# Patient Record
Sex: Male | Born: 1955 | Race: Black or African American | Hispanic: No | Marital: Single | Smoking: Current every day smoker
Health system: Southern US, Community
[De-identification: ages and names within clinical notes are randomized; demographics above are authoritative.]

## PROBLEM LIST (undated history)

## (undated) DIAGNOSIS — I10 Essential (primary) hypertension: Secondary | ICD-10-CM

## (undated) DIAGNOSIS — I639 Cerebral infarction, unspecified: Secondary | ICD-10-CM

## (undated) DIAGNOSIS — E785 Hyperlipidemia, unspecified: Secondary | ICD-10-CM

## (undated) DIAGNOSIS — E119 Type 2 diabetes mellitus without complications: Secondary | ICD-10-CM

## (undated) HISTORY — DX: Cerebral infarction, unspecified: I63.9

## (undated) HISTORY — DX: Type 2 diabetes mellitus without complications: E11.9

## (undated) HISTORY — PX: CYST REMOVAL LEG: SHX6280

## (undated) HISTORY — DX: Hyperlipidemia, unspecified: E78.5

## (undated) HISTORY — DX: Essential (primary) hypertension: I10

---

## 1999-09-19 ENCOUNTER — Emergency Department (HOSPITAL_COMMUNITY): Admission: EM | Admit: 1999-09-19 | Discharge: 1999-09-19 | Payer: Self-pay | Admitting: Emergency Medicine

## 1999-09-29 ENCOUNTER — Emergency Department (HOSPITAL_COMMUNITY): Admission: EM | Admit: 1999-09-29 | Discharge: 1999-09-29 | Payer: Self-pay | Admitting: Emergency Medicine

## 1999-09-30 ENCOUNTER — Encounter: Payer: Self-pay | Admitting: *Deleted

## 1999-10-11 ENCOUNTER — Emergency Department (HOSPITAL_COMMUNITY): Admission: EM | Admit: 1999-10-11 | Discharge: 1999-10-11 | Payer: Self-pay | Admitting: Emergency Medicine

## 1999-10-11 ENCOUNTER — Encounter: Payer: Self-pay | Admitting: Emergency Medicine

## 1999-10-21 ENCOUNTER — Encounter: Admission: RE | Admit: 1999-10-21 | Discharge: 1999-10-21 | Payer: Self-pay | Admitting: Family Medicine

## 2015-01-26 DIAGNOSIS — E119 Type 2 diabetes mellitus without complications: Secondary | ICD-10-CM

## 2015-01-26 HISTORY — DX: Type 2 diabetes mellitus without complications: E11.9

## 2015-12-26 DIAGNOSIS — I639 Cerebral infarction, unspecified: Secondary | ICD-10-CM

## 2015-12-26 HISTORY — DX: Cerebral infarction, unspecified: I63.9

## 2016-01-07 ENCOUNTER — Emergency Department (HOSPITAL_COMMUNITY): Payer: Self-pay

## 2016-01-07 ENCOUNTER — Inpatient Hospital Stay (HOSPITAL_COMMUNITY)
Admission: EM | Admit: 2016-01-07 | Discharge: 2016-01-09 | DRG: 065 | Disposition: A | Discharge status: Home / Self Care | Payer: Self-pay | Attending: Internal Medicine | Admitting: Internal Medicine

## 2016-01-07 ENCOUNTER — Encounter (HOSPITAL_COMMUNITY): Payer: Self-pay | Admitting: Internal Medicine

## 2016-01-07 ENCOUNTER — Ambulatory Visit (INDEPENDENT_AMBULATORY_CARE_PROVIDER_SITE_OTHER): Payer: Self-pay | Admitting: Urgent Care

## 2016-01-07 VITALS — BP 174/104 | HR 73 | Temp 98.3°F | Resp 17 | Ht 73.0 in | Wt 175.0 lb

## 2016-01-07 DIAGNOSIS — I10 Essential (primary) hypertension: Secondary | ICD-10-CM

## 2016-01-07 DIAGNOSIS — R200 Anesthesia of skin: Secondary | ICD-10-CM

## 2016-01-07 DIAGNOSIS — Z8249 Family history of ischemic heart disease and other diseases of the circulatory system: Secondary | ICD-10-CM

## 2016-01-07 DIAGNOSIS — R7303 Prediabetes: Secondary | ICD-10-CM | POA: Diagnosis present

## 2016-01-07 DIAGNOSIS — Z8673 Personal history of transient ischemic attack (TIA), and cerebral infarction without residual deficits: Secondary | ICD-10-CM | POA: Diagnosis present

## 2016-01-07 DIAGNOSIS — I639 Cerebral infarction, unspecified: Principal | ICD-10-CM | POA: Diagnosis present

## 2016-01-07 DIAGNOSIS — I160 Hypertensive urgency: Secondary | ICD-10-CM

## 2016-01-07 DIAGNOSIS — T464X6A Underdosing of angiotensin-converting-enzyme inhibitors, initial encounter: Secondary | ICD-10-CM | POA: Diagnosis present

## 2016-01-07 DIAGNOSIS — Z833 Family history of diabetes mellitus: Secondary | ICD-10-CM

## 2016-01-07 DIAGNOSIS — R29898 Other symptoms and signs involving the musculoskeletal system: Secondary | ICD-10-CM

## 2016-01-07 DIAGNOSIS — G8194 Hemiplegia, unspecified affecting left nondominant side: Secondary | ICD-10-CM | POA: Diagnosis present

## 2016-01-07 DIAGNOSIS — E785 Hyperlipidemia, unspecified: Secondary | ICD-10-CM | POA: Diagnosis present

## 2016-01-07 DIAGNOSIS — F17210 Nicotine dependence, cigarettes, uncomplicated: Secondary | ICD-10-CM | POA: Diagnosis present

## 2016-01-07 DIAGNOSIS — Z91128 Patient's intentional underdosing of medication regimen for other reason: Secondary | ICD-10-CM

## 2016-01-07 LAB — I-STAT TROPONIN, ED: Troponin i, poc: 0 ng/mL (ref 0.00–0.08)

## 2016-01-07 LAB — DIFFERENTIAL
BASOS ABS: 0 10*3/uL (ref 0.0–0.1)
BASOS PCT: 0 %
Eosinophils Absolute: 0.2 10*3/uL (ref 0.0–0.7)
Eosinophils Relative: 2 %
LYMPHS PCT: 23 %
Lymphs Abs: 2 10*3/uL (ref 0.7–4.0)
Monocytes Absolute: 0.5 10*3/uL (ref 0.1–1.0)
Monocytes Relative: 6 %
NEUTROS ABS: 5.9 10*3/uL (ref 1.7–7.7)
NEUTROS PCT: 69 %

## 2016-01-07 LAB — PROTIME-INR
INR: 1.01
PROTHROMBIN TIME: 13.3 s (ref 11.4–15.2)

## 2016-01-07 LAB — COMPREHENSIVE METABOLIC PANEL
ALBUMIN: 4.1 g/dL (ref 3.5–5.0)
ALK PHOS: 70 U/L (ref 38–126)
ALT: 20 U/L (ref 17–63)
AST: 23 U/L (ref 15–41)
Anion gap: 8 (ref 5–15)
BUN: 10 mg/dL (ref 6–20)
CHLORIDE: 105 mmol/L (ref 101–111)
CO2: 27 mmol/L (ref 22–32)
CREATININE: 1.07 mg/dL (ref 0.61–1.24)
Calcium: 9.6 mg/dL (ref 8.9–10.3)
GFR calc non Af Amer: >60 mL/min (ref >60)
GLUCOSE: 105 mg/dL — AB (ref 65–99)
Potassium: 3.9 mmol/L (ref 3.5–5.1)
SODIUM: 140 mmol/L (ref 135–145)
Total Bilirubin: 1.1 mg/dL (ref 0.3–1.2)
Total Protein: 7.1 g/dL (ref 6.5–8.1)

## 2016-01-07 LAB — CBC
HCT: 42.7 % (ref 39.0–52.0)
Hemoglobin: 15.1 g/dL (ref 13.0–17.0)
MCH: 29.9 pg (ref 26.0–34.0)
MCHC: 35.4 g/dL (ref 30.0–36.0)
MCV: 84.6 fL (ref 78.0–100.0)
PLATELETS: 229 10*3/uL (ref 150–400)
RBC: 5.05 MIL/uL (ref 4.22–5.81)
RDW: 13.6 % (ref 11.5–15.5)
WBC: 8.5 10*3/uL (ref 4.0–10.5)

## 2016-01-07 LAB — APTT: aPTT: 35 seconds (ref 24–36)

## 2016-01-07 MED ORDER — ACETAMINOPHEN 325 MG PO TABS: 650.0000 mg | ORAL_TABLET | ORAL | Status: DC | PRN

## 2016-01-07 MED ORDER — LORAZEPAM 2 MG/ML IJ SOLN
1.0000 mg | Freq: Once | INTRAMUSCULAR | Status: AC
  Administered 2016-01-07: 1 mg via INTRAVENOUS
  Filled 2016-01-07: qty 1

## 2016-01-07 MED ORDER — SENNOSIDES-DOCUSATE SODIUM 8.6-50 MG PO TABS: 1.0000 | ORAL_TABLET | Freq: Every evening | ORAL | Status: DC | PRN

## 2016-01-07 MED ORDER — GADOBENATE DIMEGLUMINE 529 MG/ML IV SOLN
15.0000 mL | Freq: Once | INTRAVENOUS | Status: AC | PRN
  Administered 2016-01-07: 15 mL via INTRAVENOUS

## 2016-01-07 MED ORDER — ACETAMINOPHEN 650 MG RE SUPP: 650.0000 mg | RECTAL | Status: DC | PRN

## 2016-01-07 MED ORDER — ASPIRIN 325 MG PO TABS
325.0000 mg | ORAL_TABLET | Freq: Every day | ORAL | Status: DC
  Administered 2016-01-07 – 2016-01-09 (×3): 325 mg via ORAL
  Filled 2016-01-07 (×3): qty 1

## 2016-01-07 MED ORDER — HYDRALAZINE HCL 20 MG/ML IJ SOLN
5.0000 mg | INTRAMUSCULAR | Status: DC | PRN
Start: 2016-01-07 — End: 2016-01-07

## 2016-01-07 MED ORDER — ACETAMINOPHEN 160 MG/5ML PO SOLN: 650.0000 mg | ORAL | Status: DC | PRN

## 2016-01-07 MED ORDER — STROKE: EARLY STAGES OF RECOVERY BOOK
Freq: Once | Status: AC
  Administered 2016-01-08: 06:00:00
  Filled 2016-01-07 (×2): qty 1

## 2016-01-07 MED ORDER — HYDRALAZINE HCL 20 MG/ML IJ SOLN
5.0000 mg | INTRAMUSCULAR | Status: DC | PRN
  Administered 2016-01-07: 20 mg via INTRAVENOUS
  Administered 2016-01-08 (×2): 10 mg via INTRAVENOUS
  Filled 2016-01-07 (×3): qty 1

## 2016-01-07 MED ORDER — ENOXAPARIN SODIUM 40 MG/0.4ML ~~LOC~~ SOLN
40.0000 mg | SUBCUTANEOUS | Status: DC
  Administered 2016-01-07 – 2016-01-08 (×2): 40 mg via SUBCUTANEOUS
  Filled 2016-01-07 (×2): qty 0.4

## 2016-01-07 NOTE — ED Notes (Signed)
Pt AO x 4, came to ED c/o numbness on his left leg and hand due to HTN not taking his HTN medication for months, Pt's BP on the 200/230's admitting MD aware, Dr. Alcario Drought state gold is to keep BP on the 180/100 at this point with a PRN Hydralazine ordered for it, Pt present with a positive MRI for stroke on the right side and report of small right side Lacunar infarct on CT. NIH=0 and passed Swallow screen, family member at the bedside, NAD noticed.

## 2016-01-07 NOTE — ED Provider Notes (Signed)
7:16 PM Received from Dr. Dayna Barker and Dr. Brett Albino. Patient awaiting MRI concerning for stroke. Next  Radiology called to report that MRI is positive for acute stroke. Neurology was called and they recommended admission to hospitalist service for further management including Dopplers and echo.   Hospitalist team will be called for admission.  Patient admitted for further management of stroke.   Clinical Impression: 1. Cerebrovascular accident (CVA), unspecified mechanism ([name])   2. Stroke ([name])   3. CVA (cerebral vascular accident) [name])     Disposition: Admit to Hospitalist service with neurology following.    Seth Allegra Angelyne Terwilliger, MD 01/08/16 [phone]

## 2016-01-07 NOTE — Progress Notes (Addendum)
    MRN: [id number] DOB: [birth year]  Subjective:   Seth [name] is a 60 y.o. male with pmh of HTN presenting for chief complaint of Leg Pain (left side ) and Hypertension  Reports 4 day history of left leg weakness, feels like his leg is dragging; also has left hand numbness and tingling. Patient stopped taking lisinopril in July 2017. Denies dizziness, headache, blurred vision, chest pain, shortness of breath, heart racing, palpitations, nausea, vomiting, abdominal pain, hematuria, lower leg swelling. Smokes 1/2 ppd for the past 30 years. Has occasional alcohol drink.  Oneal has a current medication list which includes the following prescription(s): lisinopril. Also has No Known Allergies.  Doren  has a past medical history of Hypertension. Also denies past surgical history.   Reports family history includes Diabetes in his mother; Hypertension in his mother.   Objective:   Vitals: BP (!) 174/104 (BP Location: Right Arm, Patient Position: Sitting, Cuff Size: Normal)   Pulse 73   Temp 98.3 F (36.8 C) (Oral)   Resp 17   Ht 6\' 1"  (1.854 m)   Wt 175 lb (79.4 kg)   SpO2 97%   BMI 23.09 kg/m   Physical Exam  Constitutional: He is oriented to person, place, and time. He appears well-developed and well-nourished.  HENT:  Mouth/Throat: Oropharynx is clear and moist.  Eyes: EOM are normal. Pupils are equal, round, and reactive to light.  Cardiovascular: Normal rate, regular rhythm and intact distal pulses.  Exam reveals no gallop and no friction rub.   No murmur heard. Pulmonary/Chest: No respiratory distress. He has no wheezes. He has no rales.  Abdominal: Soft. Bowel sounds are normal. He exhibits no distension and no mass. There is no tenderness. There is no guarding.  Musculoskeletal: He exhibits no edema.  Neurological: He is alert and oriented to person, place, and time. No cranial nerve deficit. Coordination normal.  Skin: Skin is warm and dry.   Assessment and Plan :    This case was precepted with Dr. Mingo Amber.   1. Essential hypertension 2. Hypertensive urgency 3. Left leg weakness 4. Numbness of left hand - Will send by personal transport to [facility] ED. He has new onset leg weakness, hand numbness in setting of uncontrolled HTN. I explained risks of uncontrolled HTN, patient will likely need head CT, EKG, medical therapy to lower BP safely. He will f/u with me in 1 week for his BP management. Case reported to [place] at [facility] ED.  Jaynee Eagles, PA-C [facility] [phone] 01/07/2016 9:17 AM

## 2016-01-07 NOTE — ED Notes (Signed)
Patient transported to MRI 

## 2016-01-07 NOTE — Progress Notes (Signed)
Pt admitted from ED with stroke diagnosis, denies any pain , pt settled in bed with call light at bedside, tele monitor put and verified on pt, was however reassured and will continue to monitor. Obasogie-Asidi, Shauntell Iglesia Efe

## 2016-01-07 NOTE — ED Notes (Signed)
Pt returned from MRI °

## 2016-01-07 NOTE — H&P (Signed)
History and Physical    Seth [name] [id number] DOB: [birth year] DOA: 01/07/2016   PCP: No PCP Per Patient Chief Complaint:  Chief Complaint  Patient presents with  . Hypertension  . Numbness    HPI: Seth [name] is a 60 y.o. male with medical history significant of HTN, smoking.  Patient presents to the ED with c/o left arm and leg numbness.  Symptoms onset on Friday (12/8).  Have been persistent and caused him to walk with a "limp".  And became worse yesterday.  Nothing specific seems to make them better or worse.  He has been working at his job despite symptoms.  He presented to an UC today who noted that his BP was very high and sent him in to the ED.  ED Course: In the ED work up reveals acute ischemic strokes (see MRI for details).  Review of Systems: As per HPI otherwise 10 point review of systems negative.    Past Medical History:  Diagnosis Date  . Hypertension     History reviewed. No pertinent surgical history.   reports that he has been smoking.  He has a 22.50 pack-year smoking history. He has never used smokeless tobacco. He reports that he drinks alcohol. He reports that he uses drugs, including Marijuana.  No Known Allergies  Family History  Problem Relation Age of Onset  . Diabetes Mother   . Hypertension Mother      Prior to Admission medications   Medication Sig Start Date End Date Taking? Authorizing Provider  Pheniramine-PE-APAP (THERAFLU FLU & SORE THROAT) 20-10-650 MG PACK Take 1 tablet by mouth daily as needed. Flu symptoms   Yes Historical Provider, MD  lisinopril (PRINIVIL,ZESTRIL) 10 MG tablet Take 10 mg by mouth daily.    Historical Provider, MD    Physical Exam: Vitals:   01/07/16 1930 01/07/16 1945 01/07/16 2000 01/07/16 2015  BP: (!) 202/118 (!) 179/136 (!) 178/121 (!) 217/119  Pulse:      Resp:      Temp:      TempSrc:      SpO2:          Constitutional: NAD, calm, comfortable Eyes: PERRL, lids and conjunctivae  normal ENMT: Mucous membranes are moist. Posterior pharynx clear of any exudate or lesions.Normal dentition.  Neck: normal, supple, no masses, no thyromegaly Respiratory: clear to auscultation bilaterally, no wheezing, no crackles. Normal respiratory effort. No accessory muscle use.  Cardiovascular: Regular rate and rhythm, no murmurs / rubs / gallops. No extremity edema. 2+ pedal pulses. No carotid bruits.  Abdomen: no tenderness, no masses palpated. No hepatosplenomegaly. Bowel sounds positive.  Musculoskeletal: no clubbing / cyanosis. No joint deformity upper and lower extremities. Good ROM, no contractures. Normal muscle tone.  Skin: no rashes, lesions, ulcers. No induration Neurologic: LUE dysmetria, 5/5 strength, does have sensory deficit. Psychiatric: Normal judgment and insight. Alert and oriented x 3. Normal mood.    Labs on Admission: I have personally reviewed following labs and imaging studies  CBC:  Recent Labs Lab 01/07/16 1030  WBC 8.5  NEUTROABS 5.9  HGB 15.1  HCT 42.7  MCV 84.6  PLT [id number]   Basic Metabolic Panel:  Recent Labs Lab 01/07/16 1030  NA 140  K 3.9  CL 105  CO2 27  GLUCOSE 105*  BUN 10  CREATININE 1.07  CALCIUM 9.6   GFR: Estimated Creatinine Clearance (by C-G formula based on SCr of 1.07 mg/dL) Male: 65.7 mL/min Male: 81.4 mL/min Liver Function Tests:  Recent Labs Lab 01/07/16 1030  AST 23  ALT 20  ALKPHOS 70  BILITOT 1.1  PROT 7.1  ALBUMIN 4.1   No results for input(s): LIPASE, AMYLASE in the last 168 hours. No results for input(s): AMMONIA in the last 168 hours. Coagulation Profile:  Recent Labs Lab 01/07/16 1030  INR 1.01   Cardiac Enzymes: No results for input(s): CKTOTAL, CKMB, CKMBINDEX, TROPONINI in the last 168 hours. BNP (last 3 results) No results for input(s): PROBNP in the last 8760 hours. HbA1C: No results for input(s): HGBA1C in the last 72 hours. CBG: No results for input(s): GLUCAP in the last 168  hours. Lipid Profile: No results for input(s): CHOL, HDL, LDLCALC, TRIG, CHOLHDL, LDLDIRECT in the last 72 hours. Thyroid Function Tests: No results for input(s): TSH, T4TOTAL, FREET4, T3FREE, THYROIDAB in the last 72 hours. Anemia Panel: No results for input(s): VITAMINB12, FOLATE, FERRITIN, TIBC, IRON, RETICCTPCT in the last 72 hours. Urine analysis: No results found for: COLORURINE, APPEARANCEUR, LABSPEC, PHURINE, GLUCOSEU, HGBUR, BILIRUBINUR, KETONESUR, PROTEINUR, UROBILINOGEN, NITRITE, LEUKOCYTESUR Sepsis Labs: @LABRCNTIP (procalcitonin:4,lacticidven:4) )No results found for this or any previous visit (from the past 240 hour(s)).   Radiological Exams on Admission: Ct Head Wo Contrast  Result Date: 01/07/2016 CLINICAL DATA:  Hypertension, left-sided leg and finger numbness EXAM: CT HEAD WITHOUT CONTRAST TECHNIQUE: Contiguous axial images were obtained from the base of the skull through the vertex without intravenous contrast. COMPARISON:  None. FINDINGS: Brain: The ventricular system is normal in size and configuration and the septum is in a normal midline position. The fourth ventricle and basilar cisterns are unremarkable. There is a right basal ganglial lacunar infarct present which may be acute or subacute. No hemorrhage is seen. Mild small vessel ischemic change is noted within the periventricular white matter. Vascular: No vascular abnormality is seen on this unenhanced study. Skull: No calvarial lesion is evident. Sinuses/Orbits: The paranasal sinuses is with a small probable retention cyst within the right partition of the sphenoid sinus. There also is a probable retention cyst medially within the left maxillary sinus. Other: None IMPRESSION: 1. Small lacunar infarct in the right basal ganglia may be acute or subacute. No hemorrhage. 2. Mild small vessel ischemic change in the periventricular white matter. 3. Sphenoid and left maxillary retention cysts. Electronically Signed   By: Ivar Drape M.D.   On: 01/07/2016 10:55   Mr Seth [name] Head Wo Contrast  Result Date: 01/07/2016 CLINICAL DATA:  Cerebral vascular accident. Numbness of the left lower extremity. Tingling in left hand EXAM: MRI HEAD WITHOUT AND WITH CONTRAST MRA HEAD WITHOUT CONTRAST MRA NECK WITHOUT AND WITH CONTRAST TECHNIQUE: Multiplanar, multiecho pulse sequences of the brain and surrounding structures were obtained without and with intravenous contrast. Angiographic images of the Circle of Willis were obtained using MRA technique without intravenous contrast. Angiographic images of the neck were obtained using MRA technique without and with intravenous contrast. Carotid stenosis measurements (when applicable) are obtained utilizing NASCET criteria, using the distal internal carotid diameter as the denominator. CONTRAST:  1mL MULTIHANCE GADOBENATE DIMEGLUMINE 529 MG/ML IV SOLN COMPARISON:  Head CT 01/07/2016 FINDINGS: MRI HEAD FINDINGS Brain: The midline structures are normal. There is focal diffusion restriction at the right caudate tail extending inferiorly along the course of the posterior limb of the right internal capsule. There is additional diffusion restriction at the right dentate nucleus, with normalized ADC values. There is right dentate nucleus T2 hyperintensity. There is multifocal hyperintense T2 weighted signal in the periventricular and deep white matter  and along the distribution of the above-described diffusion restriction. There is no hemorrhage, midline shift or other significant mass effect. No hydrocephalus or extra-axial fluid collection. No age advanced or lobar predominant atrophy. Vascular: No evidence of chronic microhemorrhage or amyloid angiopathy. Skull and upper cervical spine: The visualized skull base, calvarium, upper cervical spine and extracranial soft tissues are normal. Sinuses/Orbits: No fluid levels or advanced mucosal thickening. No mastoid effusion. Normal orbits. MRA HEAD FINDINGS  Intracranial internal carotid arteries: Normal. Anterior cerebral arteries: Normal. Middle cerebral arteries: There is near complete occlusion of the left middle cerebral artery at the proximal aspect of the M1 segment. No flow related enhancement seen within the distal left MCA distribution. The right middle cerebral artery is normal. Posterior communicating arteries: Present bilaterally. Posterior cerebral arteries: Normal. Basilar artery: Mild multifocal narrowing. Vertebral arteries: There is no flow related enhancement seen within left vertebral artery. Superior cerebellar arteries: Normal. Anterior inferior cerebellar arteries: Normal. Posterior inferior cerebellar arteries: Not visualized. MRA NECK FINDINGS Both carotid systems are normal. There is no dissection or hemodynamically significant stenosis. The right vertebral artery is normal in course and caliber. The left vertebral artery is diminutive with reduced contrast enhancement and near complete lack of enhancement of the V3 and V4 segments. Visualized aortic arch and proximal subclavian arteries are normal. There is a normal 3 vessel arch branching pattern. IMPRESSION: 1. 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 This is in keeping with reported left-sided symptoms. Additional area of suspected subacute ischemia within the right dentate nucleus. No hemorrhage or mass effect. 2. 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 No left MCA distribution diffusion abnormalities. Hyperacute thrombosis of the left MCA (in the brief period before diffusion weighted imaging abnormalities would appear, typically a matter of minutes) is a theoretical possibility; however, in the absence of symptoms of acute left MCA infarct, I suspect chronic MCA stenosis with collateral flow via  small/slow-flowing collaterals that are not apparent on time-of-flight imaging. CT angiography might be helpful, as it is more sensitive for the visualization of very small collaterals. 3. Diminutive left vertebral artery with limited enhancement of the V3 and V4 segments, likely secondary to atherosclerotic disease. Critical Value/emergent results were called by telephone at the time of interpretation on 01/07/2016 at 7:00 pm to Dr. Hollice Gong, who verbally acknowledged these results. Electronically Signed   By: Ulyses Jarred M.D.   On: 01/07/2016 19:08   Mr Angiogram Neck W Or Wo Contrast  Result Date: 01/07/2016 CLINICAL DATA:  Cerebral vascular accident. Numbness of the left lower extremity. Tingling in left hand EXAM: MRI HEAD WITHOUT AND WITH CONTRAST MRA HEAD WITHOUT CONTRAST MRA NECK WITHOUT AND WITH CONTRAST TECHNIQUE: Multiplanar, multiecho pulse sequences of the brain and surrounding structures were obtained without and with intravenous contrast. Angiographic images of the Circle of Willis were obtained using MRA technique without intravenous contrast. Angiographic images of the neck were obtained using MRA technique without and with intravenous contrast. Carotid stenosis measurements (when applicable) are obtained utilizing NASCET criteria, using the distal internal carotid diameter as the denominator. CONTRAST:  73mL MULTIHANCE GADOBENATE DIMEGLUMINE 529 MG/ML IV SOLN COMPARISON:  Head CT 01/07/2016 FINDINGS: MRI HEAD FINDINGS Brain: The midline structures are normal. There is focal diffusion restriction at the right caudate tail extending inferiorly along the course of the posterior limb of the right internal capsule. There  is additional diffusion restriction at the right dentate nucleus, with normalized ADC values. There is right dentate nucleus T2 hyperintensity. There is multifocal hyperintense T2 weighted signal in the periventricular and deep white matter and along the distribution of the  above-described diffusion restriction. There is no hemorrhage, midline shift or other significant mass effect. No hydrocephalus or extra-axial fluid collection. No age advanced or lobar predominant atrophy. Vascular: No evidence of chronic microhemorrhage or amyloid angiopathy. Skull and upper cervical spine: The visualized skull base, calvarium, upper cervical spine and extracranial soft tissues are normal. Sinuses/Orbits: No fluid levels or advanced mucosal thickening. No mastoid effusion. Normal orbits. MRA HEAD FINDINGS Intracranial internal carotid arteries: Normal. Anterior cerebral arteries: Normal. Middle cerebral arteries: There is near complete occlusion of the left middle cerebral artery at the proximal aspect of the M1 segment. No flow related enhancement seen within the distal left MCA distribution. The right middle cerebral artery is normal. Posterior communicating arteries: Present bilaterally. Posterior cerebral arteries: Normal. Basilar artery: Mild multifocal narrowing. Vertebral arteries: There is no flow related enhancement seen within left vertebral artery. Superior cerebellar arteries: Normal. Anterior inferior cerebellar arteries: Normal. Posterior inferior cerebellar arteries: Not visualized. MRA NECK FINDINGS Both carotid systems are normal. There is no dissection or hemodynamically significant stenosis. The right vertebral artery is normal in course and caliber. The left vertebral artery is diminutive with reduced contrast enhancement and near complete lack of enhancement of the V3 and V4 segments. Visualized aortic arch and proximal subclavian arteries are normal. There is a normal 3 vessel arch branching pattern. IMPRESSION: 1. 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 This is in keeping with reported left-sided symptoms. Additional area of suspected subacute ischemia within the right dentate nucleus. No hemorrhage or  mass effect. 2. 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 No left MCA distribution diffusion abnormalities. 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 CT angiography might be helpful, as it is more sensitive for the visualization of very small collaterals. 3. Diminutive left vertebral artery with limited enhancement of the V3 and V4 segments, likely secondary to atherosclerotic disease. Critical Value/emergent results were called by telephone at the time of interpretation on 01/07/2016 at 7:00 pm to Dr. Hollice Gong, who verbally acknowledged these results. Electronically Signed   By: Ulyses Jarred M.D.   On: 01/07/2016 19:08   Mr Seth [name] [id number] Contrast  Result Date: 01/07/2016 CLINICAL DATA:  Cerebral vascular accident. Numbness of the left lower extremity. Tingling in left hand EXAM: MRI HEAD WITHOUT AND WITH CONTRAST MRA HEAD WITHOUT CONTRAST MRA NECK WITHOUT AND WITH CONTRAST TECHNIQUE: Multiplanar, multiecho pulse sequences of the brain and surrounding structures were obtained without and with intravenous contrast. Angiographic images of the Circle of Willis were obtained using MRA technique without intravenous contrast. Angiographic images of the neck were obtained using MRA technique without and with intravenous contrast. Carotid stenosis measurements (when applicable) are obtained utilizing NASCET criteria, using the distal internal carotid diameter as the denominator. CONTRAST:  4mL MULTIHANCE GADOBENATE DIMEGLUMINE 529 MG/ML IV SOLN COMPARISON:  Head CT  01/07/2016 FINDINGS: MRI  HEAD FINDINGS Brain: The midline structures are normal. There is focal diffusion restriction at the right caudate tail extending inferiorly along the course of the posterior limb of the right internal capsule. There is additional diffusion restriction at the right dentate nucleus, with normalized ADC values. There is right dentate nucleus T2 hyperintensity. There is multifocal hyperintense T2 weighted signal in the periventricular and deep white matter and along the distribution of the above-described diffusion restriction. There is no hemorrhage, midline shift or other significant mass effect. No hydrocephalus or extra-axial fluid collection. No age advanced or lobar predominant atrophy. Vascular: No evidence of chronic microhemorrhage or amyloid angiopathy. Skull and upper cervical spine: The visualized skull base, calvarium, upper cervical spine and extracranial soft tissues are normal. Sinuses/Orbits: No fluid levels or advanced mucosal thickening. No mastoid effusion. Normal orbits. MRA HEAD FINDINGS Intracranial internal carotid arteries: Normal. Anterior cerebral arteries: Normal. Middle cerebral arteries: There is near complete occlusion of the left middle cerebral artery at the proximal aspect of the M1 segment. No flow related enhancement seen within the distal left MCA distribution. The right middle cerebral artery is normal. Posterior communicating arteries: Present bilaterally. Posterior cerebral arteries: Normal. Basilar artery: Mild multifocal narrowing. Vertebral arteries: There is no flow related enhancement seen within left vertebral artery. Superior cerebellar arteries: Normal. Anterior inferior cerebellar arteries: Normal. Posterior inferior cerebellar arteries: Not visualized. MRA NECK FINDINGS Both carotid systems are normal. There is no dissection or hemodynamically significant stenosis. The right vertebral artery is normal in course and caliber. The left vertebral  artery is diminutive with reduced contrast enhancement and near complete lack of enhancement of the V3 and V4 segments. Visualized aortic arch and proximal subclavian arteries are normal. There is a normal 3 vessel arch branching pattern. IMPRESSION: 1. 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 This is in keeping with reported left-sided symptoms. Additional area of suspected subacute ischemia within the right dentate nucleus. No hemorrhage or mass effect. 2. 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 No left MCA distribution diffusion abnormalities. 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 CT angiography might be helpful, as it is more sensitive for the visualization of very small collaterals. 3. Diminutive left vertebral artery with limited enhancement of the V3 and V4 segments, likely secondary to atherosclerotic disease. Critical Value/emergent results were called by telephone at the time of interpretation on 01/07/2016 at 7:00 pm to Dr. Hollice Gong, who verbally acknowledged these results. Electronically Signed   By: Ulyses Jarred M.D.   On: 01/07/2016 19:08    EKG: Independently reviewed.  Assessment/Plan Principal Problem:   Acute ischemic stroke (HCC) Active Problems:   HTN (hypertension)    1. Acute ischemic stroke - 1. Stroke pathway 2. Neuro consulted 3. A1C, lipid profile 4. counseled on smoking cessation 5. Needs long term BP control after hospital stay 6. PT/OT/SLP 2. HTN  - 1. Spoke with Dr. Cheral Marker: BP goals ~180/100   DVT prophylaxis: Lovenox Code Status: Full Family Communication: No family in room Consults called: Neuro Admission status: Admit to inpatient   GARDNER, JARED M. DO [facility] Pager [phone] from 7PM-7AM  If 7AM-7PM, please contact the day physician for the patient [email or web address] Password [email or web address]  01/07/2016, 8:22 PM

## 2016-01-07 NOTE — Patient Instructions (Addendum)
Please report to the [facility] ED immediately. You need to have emergent evaluation of your blood pressure to make sure that it is not affecting your brain or heart. You will likely need a head CT scan and ekg to make sure your heart is doing okay but the medical providers there will make a good decision through their own evaluation.     Hypertension Hypertension, commonly called high blood pressure, is when the force of blood pumping through your arteries is too strong. Your arteries are the blood vessels that carry blood from your heart throughout your body. A blood pressure reading consists of a higher number over a lower number, such as 110/72. The higher number (systolic) is the pressure inside your arteries when your heart pumps. The lower number (diastolic) is the pressure inside your arteries when your heart relaxes. Ideally you want your blood pressure below 120/80. Hypertension forces your heart to work harder to pump blood. Your arteries may become narrow or stiff. Having untreated or uncontrolled hypertension can cause heart attack, stroke, kidney disease, and other problems. What increases the risk? Some risk factors for high blood pressure are controllable. Others are not. Risk factors you cannot control include:  Race. You may be at higher risk if you are African American.  Age. Risk increases with age.  Gender. Men are at higher risk than women before age 42 years. After age 107, women are at higher risk than men. Risk factors you can control include:  Not getting enough exercise or physical activity.  Being overweight.  Getting too much fat, sugar, calories, or salt in your diet.  Drinking too much alcohol. What are the signs or symptoms? Hypertension does not usually cause signs or symptoms. Extremely high blood pressure (hypertensive crisis) may cause headache, anxiety, shortness of breath, and nosebleed. How is this diagnosed? To check if you have hypertension, your  health care provider will measure your blood pressure while you are seated, with your arm held at the level of your heart. It should be measured at least twice using the same arm. Certain conditions can cause a difference in blood pressure between your right and left arms. A blood pressure reading that is higher than normal on one occasion does not mean that you need treatment. If it is not clear whether you have high blood pressure, you may be asked to return on a different day to have your blood pressure checked again. Or, you may be asked to monitor your blood pressure at home for 1 or more weeks. How is this treated? Treating high blood pressure includes making lifestyle changes and possibly taking medicine. Living a healthy lifestyle can help lower high blood pressure. You may need to change some of your habits. Lifestyle changes may include:  Following the DASH diet. This diet is high in fruits, vegetables, and whole grains. It is low in salt, red meat, and added sugars.  Keep your sodium intake below 2,300 mg per day.  Getting at least 30-45 minutes of aerobic exercise at least 4 times per week.  Losing weight if necessary.  Not smoking.  Limiting alcoholic beverages.  Learning ways to reduce stress. Your health care provider may prescribe medicine if lifestyle changes are not enough to get your blood pressure under control, and if one of the following is true:  You are 100-22 years of age and your systolic blood pressure is above 140.  You are 65 years of age or older, and your systolic blood pressure  is above 150.  Your diastolic blood pressure is above 90.  You have diabetes, and your systolic blood pressure is over [id number] or your diastolic blood pressure is over 90.  You have kidney disease and your blood pressure is above 140/90.  You have heart disease and your blood pressure is above 140/90. Your personal target blood pressure may vary depending on your medical conditions,  your age, and other factors. Follow these instructions at home:  Have your blood pressure rechecked as directed by your health care provider.  Take medicines only as directed by your health care provider. Follow the directions carefully. Blood pressure medicines must be taken as prescribed. The medicine does not work as well when you skip doses. Skipping doses also puts you at risk for problems.  Do not smoke.  Monitor your blood pressure at home as directed by your health care provider. Contact a health care provider if:  You think you are having a reaction to medicines taken.  You have recurrent headaches or feel dizzy.  You have swelling in your ankles.  You have trouble with your vision. Get help right away if:  You develop a severe headache or confusion.  You have unusual weakness, numbness, or feel faint.  You have severe chest or abdominal pain.  You vomit repeatedly.  You have trouble breathing. This information is not intended to replace advice given to you by your health care provider. Make sure you discuss any questions you have with your health care provider. Document Released: 01/11/2005 Document Revised: 06/19/2015 Document Reviewed: 11/03/2012 Elsevier Interactive Patient Education  2017 [employer].     IF you received an x-ray today, you will receive an invoice from [facility]. Please contact [facility] at [phone] with questions or concerns regarding your invoice.   IF you received labwork today, you will receive an invoice from [employer]. Please contact [facility] at [phone] with questions or concerns regarding your invoice.   Our billing staff will not be able to assist you with questions regarding bills from these companies.  You will be contacted with the lab results as soon as they are available. The fastest way to get your results is to activate your My Chart account. Instructions are located  on the last page of this paperwork. If you have not heard from [place] regarding the results in 2 weeks, please contact this office.

## 2016-01-07 NOTE — ED Notes (Signed)
Hospitalist at the bedside 

## 2016-01-07 NOTE — ED Triage Notes (Signed)
Pt sent here from [facility]. Reports HTN, has not been taking his meds for several months. Having numbness sensation to left leg and left hand. Is ambulatory at triage with no acute distress noted. Grips are equal, no arm drift, no facial droop noted.

## 2016-01-07 NOTE — ED Provider Notes (Signed)
[facility] DEPT Provider Note   CSN: [id number] Arrival date & time: 01/07/16  1005   History   Chief Complaint Chief Complaint  Patient presents with  . Hypertension  . Numbness    HPI Seth [name] is a 60 y.o. male with a PMH of HTN and tobacco use presenting to the ED with numbness in his left leg and left hand. 5 days ago, he noted mild numbness from the back of his L knee down to the bottom of his L foot. He also noticed that he was walking with a limp because his left leg felt "uncoordinated". He states he felt like he had a cast on his left leg. He denies any weakness of his left leg. Yesterday, then then started having tingling in the fingers of his left hand. He states he was having a hard time holding a cigarette and flicking the cigarette. He denies any weakness of his left hand. He denies any headaches, slurred speech, changes in vision. He has not taken any blood pressure medications in the last 3 months. He was previously on Lisinopril. He endorses URI symptoms last week for which he took Robitussin and Theraflu.  HPI  Past Medical History:  Diagnosis Date  . Hypertension     There are no active problems to display for this patient.   No past surgical history on file.  OB History    No data available       Home Medications    Prior to Admission medications   Medication Sig Start Date End Date Taking? Authorizing Provider  Pheniramine-PE-APAP (THERAFLU FLU & SORE THROAT) 20-10-650 MG PACK Take 1 tablet by mouth daily as needed. Flu symptoms   Yes Historical Provider, MD  lisinopril (PRINIVIL,ZESTRIL) 10 MG tablet Take 10 mg by mouth daily.    Historical Provider, MD    Family History Family History  Problem Relation Age of Onset  . Diabetes Mother   . Hypertension Mother     Social History Social History  Substance Use Topics  . Smoking status: Current Every Day Smoker    Packs/day: 0.50    Years: 45.00  . Smokeless tobacco: Never Used  .  Alcohol use Yes     Allergies   Patient has no known allergies.   Review of Systems Review of Systems 10 Systems reviewed and are negative for acute change except as noted in the HPI.  Physical Exam Updated Vital Signs BP (!) 165/119 (BP Location: Right Arm)   Pulse 78   Temp 98.5 F (36.9 C) (Oral)   Resp 18   SpO2 97%   Physical Exam  Constitutional: He is oriented to person, place, and time. He appears well-developed and well-nourished. No distress.  HENT:  Head: Normocephalic and atraumatic.  Mouth/Throat: Oropharynx is clear and moist.  Eyes: Conjunctivae and EOM are normal. Pupils are equal, round, and reactive to light.  Neck: Normal range of motion. Neck supple.  Cardiovascular: Normal rate and regular rhythm.   No murmur heard. Pulmonary/Chest: Effort normal and breath sounds normal. No respiratory distress. He has no wheezes.  Abdominal: Soft. He exhibits no distension. There is no tenderness. There is no rebound and no guarding.  Musculoskeletal: Normal range of motion. He exhibits no edema or tenderness.  Lymphadenopathy:    He has no cervical adenopathy.  Neurological: He is alert and oriented to person, place, and time. He displays normal reflexes. No sensory deficit. He exhibits normal muscle tone. He displays a negative Romberg  sign.  Dysmetria on the left on finger-to-nose testing, 5/5 muscle strength in upper and lower extremities bilaterally  Skin: Skin is warm and dry.  Psychiatric: He has a normal mood and affect. His behavior is normal. Thought content normal.  Nursing note and vitals reviewed.  ED Treatments / Results  Labs (all labs ordered are listed, but only abnormal results are displayed) Labs Reviewed  COMPREHENSIVE METABOLIC PANEL - Abnormal; Notable for the following:       Result Value   Glucose, Bld 105 (*)    All other components within normal limits  PROTIME-INR  APTT  CBC  DIFFERENTIAL  I-STAT TROPOININ, ED  CBG MONITORING, ED      EKG  EKG Interpretation None       Radiology Ct Head Wo Contrast  Result Date: 01/07/2016 CLINICAL DATA:  Hypertension, left-sided leg and finger numbness EXAM: CT HEAD WITHOUT CONTRAST TECHNIQUE: Contiguous axial images were obtained from the base of the skull through the vertex without intravenous contrast. COMPARISON:  None. FINDINGS: Brain: The ventricular system is normal in size and configuration and the septum is in a normal midline position. The fourth ventricle and basilar cisterns are unremarkable. There is a right basal ganglial lacunar infarct present which may be acute or subacute. No hemorrhage is seen. Mild small vessel ischemic change is noted within the periventricular white matter. Vascular: No vascular abnormality is seen on this unenhanced study. Skull: No calvarial lesion is evident. Sinuses/Orbits: The paranasal sinuses is with a small probable retention cyst within the right partition of the sphenoid sinus. There also is a probable retention cyst medially within the left maxillary sinus. Other: None IMPRESSION: 1. Small lacunar infarct in the right basal ganglia may be acute or subacute. No hemorrhage. 2. Mild small vessel ischemic change in the periventricular white matter. 3. Sphenoid and left maxillary retention cysts. Electronically Signed   By: Ivar Drape M.D.   On: 01/07/2016 10:55    Procedures Procedures (including critical care time)  Medications Ordered in ED Medications - No data to display   Initial Impression / Assessment and Plan / ED Course  I have reviewed the triage vital signs and the nursing notes.  Pertinent labs & imaging results that were available during my care of the patient were reviewed by me and considered in my medical decision making (see chart for details).  Clinical Course    Seth [name] is a 60 year old male with a PMH of HTN and tobacco use presenting to the ED with left hand and leg "numbness" that started 5 days ago. However,  it seems that he is having more heaviness and incoordination of his left leg and left hand. Sensation is intact to light touch on exam. Dysmetria noted in the left upper extremity on finger-to-nose testing. BPs also noted to be elevated at 195/127, but will allow for permissive HTN.  12:30PM: CT head with small lacunar infarct in the right basal ganglia may be acute or subacute, no hemorrhage.  1:50PM: Discussed with Dr. Armida Sans (Neuro) who recommended MRI brain and MRA head and neck.  4:45PM: Will transfer care to oncoming physician, Dr. Sherry Ruffing.  Final Clinical Impressions(s) / ED Diagnoses   Final diagnoses:  None    New Prescriptions New Prescriptions   No medications on file     Sela Hua, MD 01/07/16 [place], MD 01/08/16 1547

## 2016-01-08 ENCOUNTER — Inpatient Hospital Stay (HOSPITAL_COMMUNITY): Payer: Self-pay

## 2016-01-08 ENCOUNTER — Encounter (HOSPITAL_COMMUNITY): Payer: Self-pay | Admitting: *Deleted

## 2016-01-08 DIAGNOSIS — I639 Cerebral infarction, unspecified: Principal | ICD-10-CM

## 2016-01-08 DIAGNOSIS — F17200 Nicotine dependence, unspecified, uncomplicated: Secondary | ICD-10-CM

## 2016-01-08 DIAGNOSIS — E785 Hyperlipidemia, unspecified: Secondary | ICD-10-CM

## 2016-01-08 DIAGNOSIS — I10 Essential (primary) hypertension: Secondary | ICD-10-CM

## 2016-01-08 LAB — LIPID PANEL
CHOL/HDL RATIO: 6.7 ratio
CHOLESTEROL: 283 mg/dL — AB (ref 0–200)
HDL: 42 mg/dL (ref >40)
LDL Cholesterol: 222 mg/dL — ABNORMAL HIGH (ref 0–99)
Triglycerides: 96 mg/dL (ref <150)
VLDL: 19 mg/dL (ref 0–40)

## 2016-01-08 LAB — ECHOCARDIOGRAM COMPLETE
HEIGHTINCHES: 73 in
Weight: 2694.9 oz

## 2016-01-08 MED ORDER — ATORVASTATIN CALCIUM 80 MG PO TABS
80.0000 mg | ORAL_TABLET | Freq: Every day | ORAL | Status: DC
  Administered 2016-01-08: 80 mg via ORAL
  Filled 2016-01-08: qty 1

## 2016-01-08 NOTE — Progress Notes (Signed)
OT Cancellation Note  Patient Details Name: [name] MRN: [id number] DOB: 1956/01/06   Cancelled Treatment:    Reason Eval/Treat Not Completed: Patient at procedure or test/ unavailable;Other (comment) (Echo). Will reattempt OT eval as schedule permits.  Seth [name] OTR/L Pager: [phone]  01/08/2016, 9:10 AM

## 2016-01-08 NOTE — Care Management Note (Signed)
Case Management Note  Patient Details  Name: JADIEL FARB MRN: [id number] Date of Birth: 1955/06/27  Subjective/Objective:                 Patient admitted with CVA. Currently on ASA and lipitor. (Target $4/10 generic list includes Lovastatin 10/20 Mg* Provastatin 11/14/38* Mg Tablet.)  Action/Plan:  CM will continue to follow for DC planning.   Expected Discharge Date:                  Expected Discharge Plan:  Home/Self Care  In-House Referral:     Discharge planning Services  CM Consult  Post Acute Care Choice:    Choice offered to:     DME Arranged:    DME Agency:     HH Arranged:    HH Agency:     Status of Service:  In process, will continue to follow  If discussed at Long Length of Stay Meetings, dates discussed:    Additional Comments:  Carles Collet, RN 01/08/2016, 3:44 PM

## 2016-01-08 NOTE — Evaluation (Signed)
Physical Therapy Evaluation Patient Details Name: Seth [name] MRN: [id number] DOB: 04/03/1955 Today's Date: 01/08/2016   History of Present Illness  60 y.o. male with a past medical history significant for HTN with poor adherence to treatment (no taking his BP medications for at least 3 months), admitted for further stroke evaluation and management. MRI revealed small area of acute infarct in the right centrum semiovale extending along the posterior limb of the right internal capsule. There is also aarea of suspected subacute ischemia within the right dentate nucleus. 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  Clinical Impression  Pt admitted with above diagnosis. Pt currently with functional limitations due to the deficits listed below (see PT Problem List). At the time of PT eval pt was able to perform transfers and ambulation with min guard to supervision for safety. With higher level balance activity such as backwards walking, braiding, and head turns, pt required occasional min assist for balance support. Pt will benefit from skilled PT to increase their independence and safety with mobility to allow discharge to the venue listed below.       Follow Up Recommendations Outpatient PT;Supervision - Intermittent    Equipment Recommendations  None recommended by PT    Recommendations for Other Services       Precautions / Restrictions Precautions Precautions: Fall Restrictions Weight Bearing Restrictions: No      Mobility  Bed Mobility Overal bed mobility: Modified Independent                Transfers Overall transfer level: Needs assistance Equipment used: None Transfers: Sit to/from Stand;Stand Pivot  Transfers Sit to Stand: Supervision         General transfer comment: Gross supervision for safety.   Ambulation/Gait Ambulation/Gait assistance: Supervision Ambulation Distance (Feet): 400 Feet Assistive device: None Gait Pattern/deviations: Step-through pattern;Decreased stride length;Trunk flexed Gait velocity: Decreased Gait velocity interpretation: Below normal speed for age/gender General Gait Details: Pt ambulating fairly well but demonstrated some balance issues with higher level balance challenges. Min assist provided during those instances however gross supervision for safety was provided for casual ambulation.   Stairs Stairs: Yes Stairs assistance: Min guard Stair Management: One rail Left;Alternating pattern;Forwards Number of Stairs: 5 General stair comments: Pt able to demonstrate without assist. Close guard provided for safety only  Wheelchair Mobility    Modified Rankin (Stroke Patients Only) Modified Rankin (Stroke Patients Only) Pre-Morbid Rankin Score: No symptoms Modified Rankin: No significant disability     Balance Overall balance assessment: Needs assistance Sitting-balance support: No upper extremity supported;Feet supported Sitting balance-Leahy Scale: Normal Sitting balance - Comments: sat EOB to don and lace shoes   Standing balance support: No upper extremity supported Standing balance-Leahy Scale: Fair                               Pertinent Vitals/Pain Pain Assessment: No/denies pain    Home Living Family/patient expects to be discharged to:: Private residence Living Arrangements: Alone Available Help at Discharge: Friend(s);Available PRN/intermittently Type of Home: House Home Access: Stairs to enter   Entrance Stairs-Number of Steps: 3 Home Layout: One level Home Equipment: None      Prior Function Level of Independence: Independent  Comments: Drives, works in [place]: Right    Extremity/Trunk Assessment   Upper Extremity Assessment Upper Extremity Assessment: Defer to OT evaluation    Lower Extremity Assessment Lower Extremity Assessment: LLE deficits/detail LLE Deficits / Details: Decreased hip flexor strength 4-/5    Cervical / Trunk Assessment Cervical / Trunk Assessment: Normal;Other exceptions Cervical / Trunk Exceptions: Forward head/rounded shoulders  Communication   Communication: No difficulties  Cognition Arousal/Alertness: Awake/alert Behavior During Therapy: WFL for tasks assessed/performed Overall Cognitive Status: Within Functional Limits for tasks assessed                      General Comments      Exercises     Assessment/Plan    PT Assessment Patient needs continued PT services  PT Problem List Decreased strength;Decreased range of motion;Decreased activity tolerance;Decreased balance;Decreased mobility;Decreased coordination;Decreased knowledge of use of DME;Decreased safety awareness;Decreased knowledge of precautions;Pain          PT Treatment Interventions DME instruction;Gait training;Stair training;Functional mobility training;Therapeutic activities;Therapeutic exercise;Neuromuscular re-education;Patient/family education    PT Goals (Current goals can be found in the Care Plan section)  Acute Rehab PT Goals Patient Stated Goal: home  PT Goal Formulation: With patient Time For Goal Achievement: 01/15/16 Potential to Achieve Goals: Good    Frequency Min 3X/week   Barriers to discharge        Co-evaluation               End of Session Equipment Utilized During Treatment: Gait belt Activity Tolerance: Patient tolerated treatment well Patient left: in bed;with call bell/phone within reach Nurse Communication: Mobility status         Time: 1124-1140 PT Time Calculation (min) (ACUTE ONLY): 16 min   Charges:   PT Evaluation $PT Eval Low Complexity: 1 Procedure      PT G CodesThelma Comp 01-18-16, 3:03 PM  Rolinda Roan, PT, DPT Acute Rehabilitation Services Pager: [phone]

## 2016-01-08 NOTE — Evaluation (Addendum)
Occupational Therapy Evaluation Patient Details Name: Seth [name] MRN: [id number] DOB: 1955/02/25 Today's Date: 01/08/2016    History of Present Illness 60 y.o. male with a past medical history significant for HTN with poor adherence to treatment (no taking his BP medications for at least 3 months), admitted for further stroke evaluation and management. MRI revealed small area of acute infarct in the right centrum semiovale extending along the posterior limb of the right internal capsule. There is also aarea of suspected subacute ischemia within the right dentate nucleus. 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   Clinical Impression   Pt admitted with the above diagnoses and presents with below problem list. Pt will benefit from continued acute OT to address the below listed deficits and maximize independence with basic ADLs prior to d/c home. PTA pt was independent with ADLs, drives, and works in [employer]. Pt presents with some dynamic balance deficits. Currently setup to min guard for ADLs. Session details below. LUE deficits appear to have resolved. Did encourage pt in [facility] activities incorporating LUE due to the nature of his occupation in the event there are residual higher level [facility] deficits.        Follow Up Recommendations  Supervision - Intermittent;No OT follow up    Equipment Recommendations  3 in 1 bedside commode    Recommendations for Other Services PT consult     Precautions / Restrictions Precautions Precautions: Fall Restrictions Weight Bearing Restrictions: No      Mobility Bed Mobility Overal bed mobility: Modified Independent                Transfers Overall transfer level: Needs  assistance Equipment used: None Transfers: Sit to/from Stand;Stand Pivot Transfers Sit to Stand: Supervision Stand pivot transfers: Min guard            Balance Overall balance assessment: Needs assistance Sitting-balance support: No upper extremity supported;Feet supported Sitting balance-Leahy Scale: Normal Sitting balance - Comments: sat EOB to don and lace shoes   Standing balance support: No upper extremity supported Standing balance-Leahy Scale: Fair Standing balance comment: stood at sink to complete oral care with intermittent exteernal support of sink.                            ADL Overall ADL's : Needs assistance/impaired Eating/Feeding: Set up;Sitting   Grooming: Supervision/safety;Standing Grooming Details (indicate cue type and reason): using sink for intermittent external support Upper Body Bathing: Set up;Sitting   Lower Body Bathing: Min guard;Sit to/from stand   Upper Body Dressing : Set up;Sitting   Lower Body Dressing: Min guard;Sit to/from stand   Toilet Transfer: Supervision/safety;Ambulation;Min guard   Toileting- Clothing Manipulation and Hygiene: Supervision/safety;Min guard;Sit to/from stand   Tub/ Shower Transfer: Tub transfer;Min guard;Ambulation;3 in 1   Functional mobility during ADLs: Min guard General ADL Comments: Pt reports unsteadiness during turns with mobility. Completed bed mobility, oral care standing at sink, simulated tub transfer, toilet transfer, and in-room functional mobility as detailed above.      Vision     Perception     Praxis      Pertinent Vitals/Pain Pain Assessment: No/denies pain     Hand Dominance Right   Extremity/Trunk Assessment Upper Extremity Assessment Upper Extremity Assessment: Overall [facility]  for tasks assessed (WNL gross strength, [place] at/close to baseline. Sensation [employer])   Lower Extremity Assessment Lower Extremity Assessment: Defer to PT evaluation       Communication  Communication Communication: No difficulties   Cognition Arousal/Alertness: Awake/alert Behavior During Therapy: WFL for tasks assessed/performed Overall Cognitive Status: Within Functional Limits for tasks assessed                     General Comments    Pt reports increased urgency with urinating that is not baseline.     Exercises       Shoulder Instructions      Home Living Family/patient expects to be discharged to:: Private residence Living Arrangements: Alone Available Help at Discharge: Friend(s);Available PRN/intermittently Type of Home: House Home Access: Stairs to enter Entrance Stairs-Number of Steps: 3   Home Layout: One level     Bathroom Shower/Tub: Tub/shower unit Shower/tub characteristics: Architectural technologist: Standard     Home Equipment: None          Prior Functioning/Environment Level of Independence: Independent        Comments: Drives, works in [employer] Problem List: Impaired balance (sitting and/or standing);Decreased knowledge of use of DME or AE;Decreased knowledge of precautions   OT Treatment/Interventions: Self-care/ADL training;DME and/or AE instruction;Therapeutic activities;Patient/family education;Balance training    OT Goals(Current goals can be found in the care plan section) Acute Rehab OT Goals Patient Stated Goal: home  OT Goal Formulation: With patient Time For Goal Achievement: 01/15/16 Potential to Achieve Goals: Good ADL Goals Pt Will Perform Grooming: with modified independence (including gather items from room) Pt Will Perform Lower Body Bathing: with modified independence;sit to/from stand Pt Will Perform Lower Body Dressing: with modified independence;sit to/from stand Pt Will Perform Tub/Shower Transfer: Tub transfer;with modified independence;ambulating;3 in 1  OT Frequency: Min 2X/week   Barriers to D/C:            Co-evaluation              End of Session     Activity Tolerance: Patient tolerated treatment well Patient left: in bed;with call bell/phone within reach;with bed alarm set   Time: [id number] OT Time Calculation (min): 21 min Charges:  OT General Charges $OT Visit: 1 Procedure OT Evaluation $OT Eval Low Complexity: 1 Procedure G-Codes:    Hortencia Pilar 01/15/2016, 10:55 AM

## 2016-01-08 NOTE — Consult Note (Signed)
NEURO HOSPITALIST CONSULT NOTE    Reason for Consult: stroke Requesting physician: Dr Nena Alexander HPI:                                                                                                                                          Seth [name] is an 60 y.o. male with a past medical history significant for HTN with poor adherence to treatment (no taking his BP medications for at least 3 months), admitted for further stroke evaluation and management. Patient indicated that his symptoms started 5 days ago " as a tight, heavy sensation with limping and later on dragging of the left leg". Then, he also noticed " a very slight weakness when holding things with my left hand" and at that point he became concerned that he could be having a stroke and presented to the ED yesterday. Denies associated HA, vertigo, double vision, slurred speech, language or vision impairment. MRI brain performed in the ED was independently reviewed and revealed small area of acute infarct in the right centrum semiovale extending along the posterior limb of the right internal capsule. There is also aarea of suspected subacute ischemia within the right dentate nucleus.  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 Lipid panel; cholesterol 283, HDL 42, triglycerides 96, LDL 222 Presently, he reports clinical improvement of his left sided symptoms.   Past Medical History:  Diagnosis Date  . Hypertension     History reviewed. No pertinent surgical history.  Family History  Problem Relation Age of Onset  . Diabetes Mother   . Hypertension Mother   . Stroke Neg Hx     Family History: no epilepsy, brain tumors, MS, or brain  aneurysm   Social History:  reports that he has been smoking.  He has a 22.50 pack-year smoking history. He has never used smokeless tobacco. He reports that he drinks alcohol. He reports that he uses drugs, including Marijuana.  No Known Allergies  MEDICATIONS:  I have reviewed the patient's current medications.   ROS:                                                                                                                                       History obtained from chart review and the patient  General ROS: negative for - chills, fatigue, fever, night sweats, weight gain or weight loss Psychological ROS: negative for - behavioral disorder, hallucinations, memory difficulties, mood swings or suicidal ideation Ophthalmic ROS: negative for - blurry vision, double vision, eye pain or loss of vision ENT ROS: negative for - epistaxis, nasal discharge, oral lesions, sore throat, tinnitus or vertigo Allergy and Immunology ROS: negative for - hives or itchy/watery eyes Hematological and Lymphatic ROS: negative for - bleeding problems, bruising or swollen lymph nodes Endocrine ROS: negative for - galactorrhea, hair pattern changes, polydipsia/polyuria or temperature intolerance Respiratory ROS: negative for - cough, hemoptysis, shortness of breath or wheezing Cardiovascular ROS: negative for - chest pain, dyspnea on exertion, edema or irregular heartbeat Gastrointestinal ROS: negative for - abdominal pain, diarrhea, hematemesis, nausea/vomiting or stool incontinence Genito-Urinary ROS: negative for - dysuria, hematuria, incontinence or urinary frequency/urgency Musculoskeletal ROS: negative for - joint swelling Neurological ROS: as noted in HPI Dermatological ROS: negative for rash and skin lesion changes   Blood pressure (!) 161/74, pulse 81, temperature 98 F (36.7 C),  temperature source Oral, resp. rate 18, height '6\' 1"'  (1.854 m), weight 76.4 kg (168 lb 6.9 oz), SpO2 100 %.  Physical exam:  Constitutional: well developed, pleasant male in no apparent distress. Eyes: no jaundice or exophthalmos.  Head: normocephalic. Neck: supple, no bruits, no JVD. Cardiac: no murmurs. Lungs: clear. Abdomen: soft, no tender, no mass. Extremities: no edema, clubbing, or cyanosis.  Skin: no rash Neurologic Examination:                                                                                                      General: NAD Mental Status: Alert, oriented, thought content appropriate.  Speech fluent without evidence of aphasia.  Able to follow 3 step commands without difficulty. Cranial Nerves: II:  Visual fields grossly normal, pupils equal, round, reactive to light and accommodation III,IV, VI: ptosis not present, extra-ocular motions intact bilaterally V,VII: smile symmetric, facial light touch sensation normal bilaterally VIII: hearing normal bilaterally IX,X: uvula rises symmetrically XI: bilateral shoulder shrug XII: midline tongue extension without atrophy or fasciculations  Motor: Right : Upper extremity   5/5  Left:     Upper extremity   5/5  Lower extremity   5/5     Lower extremity   5/5 Tone and bulk:normal tone throughout; no atrophy noted Sensory: Pinprick and light touch intact throughout, bilaterally Deep Tendon Reflexes:  Right: Upper Extremity   Left: Upper extremity   biceps (C-5 to C-6) 2/4   biceps (C-5 to C-6) 2/4 tricep (C7) 2/4    triceps (C7) 2/4 Brachioradialis (C6) 2/4  Brachioradialis (C6) 2/4  Lower Extremity Lower Extremity  quadriceps (L-2 to L-4) 2/4   quadriceps (L-2 to L-4) 2/4 Achilles (S1) 2/4   Achilles (S1) 2/4  Plantars: Right: downgoing   Left: downgoing Cerebellar: normal finger-to-nose,  normal heel-to-shin test Gait:  No ataxia    Lab Results  Component Value Date/Time   CHOL 283 (H) 01/08/2016  04:36 AM    Results for orders placed or performed during the hospital encounter of 01/07/16 (from the past 48 hour(s))  Protime-INR     Status: None   Collection Time: 01/07/16 10:30 AM  Result Value Ref Range   Prothrombin Time 13.3 11.4 - 15.2 seconds   INR 1.01   APTT     Status: None   Collection Time: 01/07/16 10:30 AM  Result Value Ref Range   aPTT 35 24 - 36 seconds  CBC     Status: None   Collection Time: 01/07/16 10:30 AM  Result Value Ref Range   WBC 8.5 4.0 - 10.5 K/uL   RBC 5.05 4.22 - 5.81 MIL/uL   Hemoglobin 15.1 13.0 - 17.0 g/dL   HCT 42.7 39.0 - 52.0 %   MCV 84.6 78.0 - 100.0 fL   MCH 29.9 26.0 - 34.0 pg   MCHC 35.4 30.0 - 36.0 g/dL   RDW 13.6 11.5 - 15.5 %   Platelets 229 150 - 400 K/uL  Differential     Status: None   Collection Time: 01/07/16 10:30 AM  Result Value Ref Range   Neutrophils Relative % 69 %   Neutro Abs 5.9 1.7 - 7.7 K/uL   Lymphocytes Relative 23 %   Lymphs Abs 2.0 0.7 - 4.0 K/uL   Monocytes Relative 6 %   Monocytes Absolute 0.5 0.1 - 1.0 K/uL   Eosinophils Relative 2 %   Eosinophils Absolute 0.2 0.0 - 0.7 K/uL   Basophils Relative 0 %   Basophils Absolute 0.0 0.0 - 0.1 K/uL  Comprehensive metabolic panel     Status: Abnormal   Collection Time: 01/07/16 10:30 AM  Result Value Ref Range   Sodium 140 135 - 145 mmol/L   Potassium 3.9 3.5 - 5.1 mmol/L   Chloride 105 101 - 111 mmol/L   CO2 27 22 - 32 mmol/L   Glucose, Bld 105 (H) 65 - 99 mg/dL   BUN 10 6 - 20 mg/dL   Creatinine, Ser 1.07 0.61 - 1.24 mg/dL   Calcium 9.6 8.9 - 10.3 mg/dL   Total Protein 7.1 6.5 - 8.1 g/dL   Albumin 4.1 3.5 - 5.0 g/dL   AST 23 15 - 41 U/L   ALT 20 17 - 63 U/L   Alkaline Phosphatase 70 38 - 126 U/L   Total Bilirubin 1.1 0.3 - 1.2 mg/dL   GFR calc non Af Amer >60 >60 mL/min   GFR calc Af Amer >60 >60 mL/min    Comment: (NOTE) The eGFR has been calculated using the CKD EPI equation. This calculation has not been validated in all clinical  situations. eGFR's persistently <60 mL/min signify possible Chronic Kidney Disease.    Anion gap 8 5 - 15  I-stat troponin, ED     Status: None   Collection Time: 01/07/16 10:38 AM  Result Value Ref Range   Troponin i, poc 0.00 0.00 - 0.08 ng/mL   Comment 3            Comment: Due to the release kinetics of cTnI, a negative result within the first hours of the onset of symptoms does not rule out myocardial infarction with certainty. If myocardial infarction is still suspected, repeat the test at appropriate intervals.   Lipid panel     Status: Abnormal   Collection Time: 01/08/16  4:36 AM  Result Value Ref Range   Cholesterol 283 (H) 0 - 200 mg/dL   Triglycerides 96 <150 mg/dL   HDL 42 >40 mg/dL   Total CHOL/HDL Ratio 6.7 RATIO   VLDL 19 0 - 40 mg/dL   LDL Cholesterol 222 (H) 0 - 99 mg/dL    Comment:        Total Cholesterol/HDL:CHD Risk Coronary Heart Disease Risk Table                     Men   Women  1/2 Average Risk   3.4   3.3  Average Risk       5.0   4.4  2 X Average Risk   9.6   7.1  3 X Average Risk  23.4   11.0        Use the calculated Patient Ratio above and the CHD Risk Table to determine the patient's CHD Risk.        ATP III CLASSIFICATION (LDL):  <100     mg/dL   Optimal  100-129  mg/dL   Near or Above                    Optimal  130-159  mg/dL   Borderline  160-189  mg/dL   High  >190     mg/dL   Very High     Ct Head Wo Contrast  Result Date: 01/07/2016 CLINICAL DATA:  Hypertension, left-sided leg and finger numbness EXAM: CT HEAD WITHOUT CONTRAST TECHNIQUE: Contiguous axial images were obtained from the base of the skull through the vertex without intravenous contrast. COMPARISON:  None. FINDINGS: Brain: The ventricular system is normal in size and configuration and the septum is in a normal midline position. The fourth ventricle and basilar cisterns are unremarkable. There is a right basal ganglial lacunar infarct present which may be acute or  subacute. No hemorrhage is seen. Mild small vessel ischemic change is noted within the periventricular white matter. Vascular: No vascular abnormality is seen on this unenhanced study. Skull: No calvarial lesion is evident. Sinuses/Orbits: The paranasal sinuses is with a small probable retention cyst within the right partition of the sphenoid sinus. There also is a probable retention cyst medially within the left maxillary sinus. Other: None IMPRESSION: 1. Small lacunar infarct in the right basal ganglia may be acute or subacute. No hemorrhage. 2. Mild small vessel ischemic change in the periventricular white matter. 3. Sphenoid and left maxillary retention cysts. Electronically Signed   By: Ivar Drape M.D.   On: 01/07/2016 10:55   Mr Jodene Nam Head Wo Contrast  Result Date: 01/07/2016 CLINICAL DATA:  Cerebral vascular accident. Numbness of the left lower extremity. Tingling in left hand EXAM: MRI HEAD WITHOUT AND WITH  CONTRAST MRA HEAD WITHOUT CONTRAST MRA NECK WITHOUT AND WITH CONTRAST TECHNIQUE: Multiplanar, multiecho pulse sequences of the brain and surrounding structures were obtained without and with intravenous contrast. Angiographic images of the Circle of Willis were obtained using MRA technique without intravenous contrast. Angiographic images of the neck were obtained using MRA technique without and with intravenous contrast. Carotid stenosis measurements (when applicable) are obtained utilizing NASCET criteria, using the distal internal carotid diameter as the denominator. CONTRAST:  96m MULTIHANCE GADOBENATE DIMEGLUMINE 529 MG/ML IV SOLN COMPARISON:  Head CT 01/07/2016 FINDINGS: MRI HEAD FINDINGS Brain: The midline structures are normal. There is focal diffusion restriction at the right caudate tail extending inferiorly along the course of the posterior limb of the right internal capsule. There is additional diffusion restriction at the right dentate nucleus, with normalized ADC values. There is right  dentate nucleus T2 hyperintensity. There is multifocal hyperintense T2 weighted signal in the periventricular and deep white matter and along the distribution of the above-described diffusion restriction. There is no hemorrhage, midline shift or other significant mass effect. No hydrocephalus or extra-axial fluid collection. No age advanced or lobar predominant atrophy. Vascular: No evidence of chronic microhemorrhage or amyloid angiopathy. Skull and upper cervical spine: The visualized skull base, calvarium, upper cervical spine and extracranial soft tissues are normal. Sinuses/Orbits: No fluid levels or advanced mucosal thickening. No mastoid effusion. Normal orbits. MRA HEAD FINDINGS Intracranial internal carotid arteries: Normal. Anterior cerebral arteries: Normal. Middle cerebral arteries: There is near complete occlusion of the left middle cerebral artery at the proximal aspect of the M1 segment. No flow related enhancement seen within the distal left MCA distribution. The right middle cerebral artery is normal. Posterior communicating arteries: Present bilaterally. Posterior cerebral arteries: Normal. Basilar artery: Mild multifocal narrowing. Vertebral arteries: There is no flow related enhancement seen within left vertebral artery. Superior cerebellar arteries: Normal. Anterior inferior cerebellar arteries: Normal. Posterior inferior cerebellar arteries: Not visualized. MRA NECK FINDINGS Both carotid systems are normal. There is no dissection or hemodynamically significant stenosis. The right vertebral artery is normal in course and caliber. The left vertebral artery is diminutive with reduced contrast enhancement and near complete lack of enhancement of the V3 and V4 segments. Visualized aortic arch and proximal subclavian arteries are normal. There is a normal 3 vessel arch branching pattern. IMPRESSION: 1. Acute infarct of the right centrum semiovale extending along the posterior limb of the right  internal capsule. This is in keeping with reported left-sided symptoms. Additional area of suspected subacute ischemia within the right dentate nucleus. No hemorrhage or mass effect. 2. 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 No left MCA distribution diffusion abnormalities. 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 CT angiography might be helpful, as it is more sensitive for the visualization of very small collaterals. 3. Diminutive left vertebral artery with limited enhancement of the V3 and V4 segments, likely secondary to atherosclerotic disease. Critical Value/emergent results were called by telephone at the time of interpretation on 01/07/2016 at 7:00 pm to Dr. CHollice Gong who verbally acknowledged these results. Electronically Signed   By: KUlyses JarredM.D.   On: 01/07/2016 19:08  Mr Angiogram Neck W Or Wo Contrast  Result Date: 01/07/2016 CLINICAL DATA:  Cerebral vascular accident. Numbness of the left lower extremity. Tingling in left hand EXAM: MRI HEAD WITHOUT AND WITH CONTRAST MRA HEAD WITHOUT CONTRAST MRA NECK WITHOUT AND WITH CONTRAST TECHNIQUE: Multiplanar, multiecho pulse sequences of the brain and surrounding structures were obtained without and with intravenous contrast. Angiographic images of the Circle of Willis were obtained using MRA technique without intravenous contrast. Angiographic images of the neck were obtained using MRA technique without and with intravenous contrast. Carotid stenosis measurements (when applicable) are obtained  utilizing NASCET criteria, using the distal internal carotid diameter as the denominator. CONTRAST:  3m MULTIHANCE GADOBENATE DIMEGLUMINE 529 MG/ML IV SOLN COMPARISON:  Head CT 01/07/2016 FINDINGS: MRI HEAD FINDINGS Brain: The midline structures are normal. There is focal diffusion restriction at the right caudate tail extending inferiorly along the course of the posterior limb of the right internal capsule. There is additional diffusion restriction at the right dentate nucleus, with normalized ADC values. There is right dentate nucleus T2 hyperintensity. There is multifocal hyperintense T2 weighted signal in the periventricular and deep white matter and along the distribution of the above-described diffusion restriction. There is no hemorrhage, midline shift or other significant mass effect. No hydrocephalus or extra-axial fluid collection. No age advanced or lobar predominant atrophy. Vascular: No evidence of chronic microhemorrhage or amyloid angiopathy. Skull and upper cervical spine: The visualized skull base, calvarium, upper cervical spine and extracranial soft tissues are normal. Sinuses/Orbits: No fluid levels or advanced mucosal thickening. No mastoid effusion. Normal orbits. MRA HEAD FINDINGS Intracranial internal carotid arteries: Normal. Anterior cerebral arteries: Normal. Middle cerebral arteries: There is near complete occlusion of the left middle cerebral artery at the proximal aspect of the M1 segment. No flow related enhancement seen within the distal left MCA distribution. The right middle cerebral artery is normal. Posterior communicating arteries: Present bilaterally. Posterior cerebral arteries: Normal. Basilar artery: Mild multifocal narrowing. Vertebral arteries: There is no flow related enhancement seen within left vertebral artery. Superior cerebellar arteries: Normal. Anterior inferior cerebellar arteries: Normal. Posterior inferior cerebellar arteries: Not visualized. MRA NECK FINDINGS  Both carotid systems are normal. There is no dissection or hemodynamically significant stenosis. The right vertebral artery is normal in course and caliber. The left vertebral artery is diminutive with reduced contrast enhancement and near complete lack of enhancement of the V3 and V4 segments. Visualized aortic arch and proximal subclavian arteries are normal. There is a normal 3 vessel arch branching pattern. IMPRESSION: 1. 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 This is in keeping with reported left-sided symptoms. Additional area of suspected subacute ischemia within the right dentate nucleus. No hemorrhage or mass effect. 2. 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 No left MCA distribution diffusion abnormalities. 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 CT angiography might be helpful, as it is more sensitive for the visualization of very small collaterals. 3. Diminutive left vertebral artery with limited enhancement of the V3 and V4 segments, likely secondary to atherosclerotic disease. Critical Value/emergent results were called by telephone at  the time of interpretation on 01/07/2016 at 7:00 pm to Dr. Hollice Gong, who verbally acknowledged these results. Electronically Signed   By: Ulyses Jarred M.D.   On: 01/07/2016 19:08   Mr Jeri Cos RF Contrast  Result Date: 01/07/2016 CLINICAL DATA:  Cerebral vascular accident. Numbness of the left lower extremity. Tingling in left hand EXAM: MRI  HEAD WITHOUT AND WITH CONTRAST MRA HEAD WITHOUT CONTRAST MRA NECK WITHOUT AND WITH CONTRAST TECHNIQUE: Multiplanar, multiecho pulse sequences of the brain and surrounding structures were obtained without and with intravenous contrast. Angiographic images of the Circle of Willis were obtained using MRA technique without intravenous contrast. Angiographic images of the neck were obtained using MRA technique without and with intravenous contrast. Carotid stenosis measurements (when applicable) are obtained utilizing NASCET criteria, using the distal internal carotid diameter as the denominator. CONTRAST:  3m MULTIHANCE GADOBENATE DIMEGLUMINE 529 MG/ML IV SOLN COMPARISON:  Head CT 01/07/2016 FINDINGS: MRI HEAD FINDINGS Brain: The midline structures are normal. There is focal diffusion restriction at the right caudate tail extending inferiorly along the course of the posterior limb of the right internal capsule. There is additional diffusion restriction at the right dentate nucleus, with normalized ADC values. There is right dentate nucleus T2 hyperintensity. There is multifocal hyperintense T2 weighted signal in the periventricular and deep white matter and along the distribution of the above-described diffusion restriction. There is no hemorrhage, midline shift or other significant mass effect. No hydrocephalus or extra-axial fluid collection. No age advanced or lobar predominant atrophy. Vascular: No evidence of chronic microhemorrhage or amyloid angiopathy. Skull and upper cervical spine: The visualized skull base, calvarium, upper cervical spine and extracranial soft tissues are normal. Sinuses/Orbits: No fluid levels or advanced mucosal thickening. No mastoid effusion. Normal orbits. MRA HEAD FINDINGS Intracranial internal carotid arteries: Normal. Anterior cerebral arteries: Normal. Middle cerebral arteries: There is near complete occlusion of the left middle cerebral artery at the proximal aspect of the M1  segment. No flow related enhancement seen within the distal left MCA distribution. The right middle cerebral artery is normal. Posterior communicating arteries: Present bilaterally. Posterior cerebral arteries: Normal. Basilar artery: Mild multifocal narrowing. Vertebral arteries: There is no flow related enhancement seen within left vertebral artery. Superior cerebellar arteries: Normal. Anterior inferior cerebellar arteries: Normal. Posterior inferior cerebellar arteries: Not visualized. MRA NECK FINDINGS Both carotid systems are normal. There is no dissection or hemodynamically significant stenosis. The right vertebral artery is normal in course and caliber. The left vertebral artery is diminutive with reduced contrast enhancement and near complete lack of enhancement of the V3 and V4 segments. Visualized aortic arch and proximal subclavian arteries are normal. There is a normal 3 vessel arch branching pattern. IMPRESSION: 1. 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 This is in keeping with reported left-sided symptoms. Additional area of suspected subacute ischemia within the right dentate nucleus. No hemorrhage or mass effect. 2. 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 No left MCA distribution diffusion abnormalities. 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 CT angiography might be helpful, as it is more sensitive for the  visualization of very small collaterals. 3. Diminutive left vertebral artery  with limited enhancement of the V3 and V4 segments, likely secondary to atherosclerotic disease. Critical Value/emergent results were called by telephone at the time of interpretation on 01/07/2016 at 7:00 pm to Dr. Hollice Gong, who verbally acknowledged these results. Electronically Signed   By: Ulyses Jarred M.D.   On: 01/07/2016 19:08    Assessment/Plan:  60 y.o. male with a past medical history significant for HTN with poor adherence to treatment (no taking his BP medications for at least 3 months), admitted for further stroke evaluation and management. Patient indicated that his symptoms started 5 days ago " as a tight, heavy sensation with limping and later on dragging of the left leg".  Current NIHSS 0. MRI brain confirms small area of acute infarct in the right centrum semiovale extending along theposterior limb of the right internal capsule. There is also aarea of suspected subacute ischemia within the right dentate nucleus.  MRA brain with likely chronic MCA stenosis with collateral flow via small/slow-flowing collaterals that are not apparent on time-of-flight imaging: may need to confirm findings with CTA head. LDL 222: needs hight intensity statin therapy. ASA. Complete stroke work up. Stroke team will resume care tomorrow.   Dorian Pod, MD 01/08/2016, 10:14 AM  [facility]

## 2016-01-08 NOTE — Progress Notes (Signed)
  Echocardiogram 2D Echocardiogram has been performed.  Seth [name] 01/08/2016, 9:18 AM

## 2016-01-08 NOTE — Evaluation (Signed)
Speech Language Pathology Evaluation Patient Details Name: Seth [name] MRN: [id number] DOB: 04-27-1955 Today's Date: 01/08/2016 Time: 1410-1450 SLP Time Calculation (min) (ACUTE ONLY): 40 min  Problem List:  Patient Active Problem List   Diagnosis Date Noted  . Acute ischemic stroke ([place]) 01/07/2016  . HTN (hypertension) 01/07/2016   Past Medical History:  Past Medical History:  Diagnosis Date  . Hypertension    Past Surgical History: History reviewed. No pertinent surgical history. HPI:  Seth [name] Seth [name] an 60 y.o.malewith a past medical history significant for HTN with poor adherence to treatment (no taking his BP medications for at least 3 months), admitted for further stroke evaluation and management. MRI brain  revealed small area of acute infarct in the right centrum semiovale extending along the posterior limb of the right internal capsule. There is also area of suspected subacute ischemia within the right dentate nucleus.    Assessment / Plan / Recommendation Clinical Impression  Patient presents with mild cognitive communication impairment characterized by deficits in short-term recall and sequencing. MOCA was administered; patient scored 24/30 (above 26 is considered WFL). High-level deficits; patient appears to be close to functional baseline. Language, executive function and visuospatial skills are judged to be Seth [name].    SLP Assessment  Patient does not need any further Speech Lanaguage Pathology Services    Follow Up Recommendations  None    Frequency and Duration           SLP Evaluation Cognition  Overall Cognitive Status: Within Functional Limits for tasks assessed Arousal/Alertness: Awake/alert Orientation Level: Oriented X4 Attention: Focused Focused Attention: Appears intact Memory: Impaired (mild) Memory Impairment: Decreased short term memory Decreased Short Term Memory: Functional complex Awareness: Appears intact Problem Solving: Appears  intact Safety/Judgment: Appears intact       Comprehension  Auditory Comprehension Overall Auditory Comprehension: Appears within functional limits for tasks assessed Visual Recognition/Discrimination Discrimination: Within Function Limits    Expression Expression Primary Mode of Expression: Verbal Verbal Expression Overall Verbal Expression: Appears within functional limits for tasks assessed (reports occasional word-finding difficulty) Written Expression Dominant Hand: Right Written Expression: Not tested   Oral / Motor  Motor Speech Overall Motor Speech: Appears within functional limits for tasks assessed   GO                    Seth [name] 01/08/2016, 3:30 PM

## 2016-01-08 NOTE — Progress Notes (Signed)
PROGRESS NOTE        PATIENT DETAILS Name: Seth [name] Age: 60 y.o. Sex: male Date of Birth: 01/20/1956 Admit Date: 01/07/2016 Admitting Physician Etta Quill, DO PCP:No PCP Per Patient  Brief Narrative: Patient is a 60 y.o. male with hx of HTN, tobacco abuse admitted with left sided weakness that started approximately 5 days prior to admission. Found to with acute infarct in the right centrum semiovale. See below for further details.   Subjective: Thinks left sided weakness has improved.   Assessment/Plan: Acute Ischemic CVA: left sided weakness has significantly improved. Symptoms started approximately 5 days prior to admission-hence not a TPA candidate. TTE without any embolic source-EF [id number]. Carotid Doppler negative for stenosis. LDL 222, A1C pending. Continue ASA, started high intensity statin. Await further recommendations from Neurology  Dyslipidemia:continue Statin-see above  [id number] mild permissive HTN-will slowly initiate anti-hypertensives  Tobacco Abuse:counseled patient to quite  DVT Prophylaxis: Prophylactic Lovenox  Code Status: Full code   Family Communication: None at bedside  Disposition Plan: Remain inpatient-home in next day or so  Antimicrobial agents: None  Procedures: None  CONSULTS:  neurology  Time spent: 25 minutes-Greater than 50% of this time was spent in counseling, explanation of diagnosis, planning of further management, and coordination of care.  MEDICATIONS: Anti-infectives    None      Scheduled Meds: . aspirin  325 mg Oral Daily  . atorvastatin  80 mg Oral q1800  . enoxaparin (LOVENOX) injection  40 mg Subcutaneous Q24H   Continuous Infusions: PRN Meds:.acetaminophen **OR** acetaminophen (TYLENOL) oral liquid 160 mg/5 mL **OR** acetaminophen, hydrALAZINE, senna-docusate   PHYSICAL EXAM: Vital signs: Vitals:   01/08/16 0530 01/08/16 0730 01/08/16 0925 01/08/16 1346  BP: (!)  177/110 (!) 152/94 (!) 161/74 (!) 181/81  Pulse: 78 84 81 71  Resp: 18 18 18 18   Temp: 98.2 F (36.8 C)  98 F (36.7 C) 98.2 F (36.8 C)  TempSrc: Oral  Oral Oral  SpO2: 100% 100% 100% 100%  Weight:      Height:       Filed Weights   01/07/16 2139  Weight: 76.4 kg (168 lb 6.9 oz)   Body mass index is 22.22 kg/m.   General appearance :Awake, alert, not in any distress. Speech Clear. Not toxic Looking Eyes:, pupils equally reactive to light and accomodation,no scleral icterus.Pink conjunctiva HEENT: Atraumatic and Normocephalic Neck: supple, no JVD. No cervical lymphadenopathy. No thyromegaly Resp:Good air entry bilaterally, no added sounds  CVS: S1 S2 regular, no murmurs.  GI: Bowel sounds present, Non tender and not distended with no gaurding, rigidity or rebound.No organomegaly Extremities: B/L Lower Ext shows no edema, both legs are warm to touch Neurology:  speech clear,Non focal Psychiatric: Normal judgment and insight. Alert and oriented x 3. Normal mood. Musculoskeletal:No digital cyanosis Skin:No Rash, warm and dry Wounds:N/A  I have personally reviewed following labs and imaging studies  LABORATORY DATA: CBC:  Recent Labs Lab 01/07/16 1030  WBC 8.5  NEUTROABS 5.9  HGB 15.1  HCT 42.7  MCV 84.6  PLT [id number]    Basic Metabolic Panel:  Recent Labs Lab 01/07/16 1030  NA 140  K 3.9  CL 105  CO2 27  GLUCOSE 105*  BUN 10  CREATININE 1.07  CALCIUM 9.6    GFR: Estimated Creatinine Clearance (by C-G formula based on SCr of  1.07 mg/dL) Male: 65.7 mL/min Male: 78.3 mL/min  Liver Function Tests:  Recent Labs Lab 01/07/16 1030  AST 23  ALT 20  ALKPHOS 70  BILITOT 1.1  PROT 7.1  ALBUMIN 4.1   No results for input(s): LIPASE, AMYLASE in the last 168 hours. No results for input(s): AMMONIA in the last 168 hours.  Coagulation Profile:  Recent Labs Lab 01/07/16 1030  INR 1.01    Cardiac Enzymes: No results for input(s): CKTOTAL, CKMB,  CKMBINDEX, TROPONINI in the last 168 hours.  BNP (last 3 results) No results for input(s): PROBNP in the last 8760 hours.  HbA1C: No results for input(s): HGBA1C in the last 72 hours.  CBG: No results for input(s): GLUCAP in the last 168 hours.  Lipid Profile:  Recent Labs  01/08/16 0436  CHOL 283*  HDL 42  LDLCALC 222*  TRIG 96  CHOLHDL 6.7    Thyroid Function Tests: No results for input(s): TSH, T4TOTAL, FREET4, T3FREE, THYROIDAB in the last 72 hours.  Anemia Panel: No results for input(s): VITAMINB12, FOLATE, FERRITIN, TIBC, IRON, RETICCTPCT in the last 72 hours.  Urine analysis: No results found for: COLORURINE, APPEARANCEUR, LABSPEC, PHURINE, GLUCOSEU, HGBUR, BILIRUBINUR, KETONESUR, PROTEINUR, UROBILINOGEN, NITRITE, LEUKOCYTESUR  Sepsis Labs: Lactic Acid, Venous No results found for: LATICACIDVEN  MICROBIOLOGY: No results found for this or any previous visit (from the past 240 hour(s)).  RADIOLOGY STUDIES/RESULTS: Ct Head Wo Contrast  Result Date: 01/07/2016 CLINICAL DATA:  Hypertension, left-sided leg and finger numbness EXAM: CT HEAD WITHOUT CONTRAST TECHNIQUE: Contiguous axial images were obtained from the base of the skull through the vertex without intravenous contrast. COMPARISON:  None. FINDINGS: Brain: The ventricular system is normal in size and configuration and the septum is in a normal midline position. The fourth ventricle and basilar cisterns are unremarkable. There is a right basal ganglial lacunar infarct present which may be acute or subacute. No hemorrhage is seen. Mild small vessel ischemic change is noted within the periventricular white matter. Vascular: No vascular abnormality is seen on this unenhanced study. Skull: No calvarial lesion is evident. Sinuses/Orbits: The paranasal sinuses is with a small probable retention cyst within the right partition of the sphenoid sinus. There also is a probable retention cyst medially within the left maxillary  sinus. Other: None IMPRESSION: 1. Small lacunar infarct in the right basal ganglia may be acute or subacute. No hemorrhage. 2. Mild small vessel ischemic change in the periventricular white matter. 3. Sphenoid and left maxillary retention cysts. Electronically Signed   By: Ivar Drape M.D.   On: 01/07/2016 10:55   Mr Jodene Nam Head Wo Contrast  Result Date: 01/07/2016 CLINICAL DATA:  Cerebral vascular accident. Numbness of the left lower extremity. Tingling in left hand EXAM: MRI HEAD WITHOUT AND WITH CONTRAST MRA HEAD WITHOUT CONTRAST MRA NECK WITHOUT AND WITH CONTRAST TECHNIQUE: Multiplanar, multiecho pulse sequences of the brain and surrounding structures were obtained without and with intravenous contrast. Angiographic images of the Circle of Willis were obtained using MRA technique without intravenous contrast. Angiographic images of the neck were obtained using MRA technique without and with intravenous contrast. Carotid stenosis measurements (when applicable) are obtained utilizing NASCET criteria, using the distal internal carotid diameter as the denominator. CONTRAST:  4mL MULTIHANCE GADOBENATE DIMEGLUMINE 529 MG/ML IV SOLN COMPARISON:  Head CT 01/07/2016 FINDINGS: MRI HEAD FINDINGS Brain: The midline structures are normal. There is focal diffusion restriction at the right caudate tail extending inferiorly along the course of the posterior limb of the right  internal capsule. There is additional diffusion restriction at the right dentate nucleus, with normalized ADC values. There is right dentate nucleus T2 hyperintensity. There is multifocal hyperintense T2 weighted signal in the periventricular and deep white matter and along the distribution of the above-described diffusion restriction. There is no hemorrhage, midline shift or other significant mass effect. No hydrocephalus or extra-axial fluid collection. No age advanced or lobar predominant atrophy. Vascular: No evidence of chronic microhemorrhage or  amyloid angiopathy. Skull and upper cervical spine: The visualized skull base, calvarium, upper cervical spine and extracranial soft tissues are normal. Sinuses/Orbits: No fluid levels or advanced mucosal thickening. No mastoid effusion. Normal orbits. MRA HEAD FINDINGS Intracranial internal carotid arteries: Normal. Anterior cerebral arteries: Normal. Middle cerebral arteries: There is near complete occlusion of the left middle cerebral artery at the proximal aspect of the M1 segment. No flow related enhancement seen within the distal left MCA distribution. The right middle cerebral artery is normal. Posterior communicating arteries: Present bilaterally. Posterior cerebral arteries: Normal. Basilar artery: Mild multifocal narrowing. Vertebral arteries: There is no flow related enhancement seen within left vertebral artery. Superior cerebellar arteries: Normal. Anterior inferior cerebellar arteries: Normal. Posterior inferior cerebellar arteries: Not visualized. MRA NECK FINDINGS Both carotid systems are normal. There is no dissection or hemodynamically significant stenosis. The right vertebral artery is normal in course and caliber. The left vertebral artery is diminutive with reduced contrast enhancement and near complete lack of enhancement of the V3 and V4 segments. Visualized aortic arch and proximal subclavian arteries are normal. There is a normal 3 vessel arch branching pattern. IMPRESSION: 1. 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 This is in keeping with reported left-sided symptoms. Additional area of suspected subacute ischemia within the right dentate nucleus. No hemorrhage or mass effect. 2. 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 No left MCA distribution diffusion abnormalities. Hyperacute thrombosis of the left MCA (in  the brief period before diffusion weighted imaging abnormalities would appear, typically a matter of minutes) is a theoretical possibility; however, in the absence of symptoms of acute left MCA infarct, I suspect chronic MCA stenosis with collateral flow via small/slow-flowing collaterals that are not apparent on time-of-flight imaging. CT angiography might be helpful, as it is more sensitive for the visualization of very small collaterals. 3. Diminutive left vertebral artery with limited enhancement of the V3 and V4 segments, likely secondary to atherosclerotic disease. Critical Value/emergent results were called by telephone at the time of interpretation on 01/07/2016 at 7:00 pm to Dr. Hollice Gong, who verbally acknowledged these results. Electronically Signed   By: Ulyses Jarred M.D.   On: 01/07/2016 19:08   Mr Angiogram Neck W Or Wo Contrast  Result Date: 01/07/2016 CLINICAL DATA:  Cerebral vascular accident. Numbness of the left lower extremity. Tingling in left hand EXAM: MRI HEAD WITHOUT AND WITH CONTRAST MRA HEAD WITHOUT CONTRAST MRA NECK WITHOUT AND WITH CONTRAST TECHNIQUE: Multiplanar, multiecho pulse sequences of the brain and surrounding structures were obtained without and with intravenous contrast. Angiographic images of the Circle of Willis were obtained using MRA technique without intravenous contrast. Angiographic images of the neck were obtained using MRA technique without and with intravenous contrast. Carotid stenosis measurements (when applicable) are obtained utilizing NASCET criteria, using the distal internal carotid diameter as the denominator. CONTRAST:  62mL MULTIHANCE GADOBENATE DIMEGLUMINE 529 MG/ML IV SOLN COMPARISON:  Head  CT 01/07/2016 FINDINGS: MRI HEAD FINDINGS Brain: The midline structures are normal. There is focal diffusion restriction at the right caudate tail extending inferiorly along the course of the posterior limb of the right internal capsule. There is additional  diffusion restriction at the right dentate nucleus, with normalized ADC values. There is right dentate nucleus T2 hyperintensity. There is multifocal hyperintense T2 weighted signal in the periventricular and deep white matter and along the distribution of the above-described diffusion restriction. There is no hemorrhage, midline shift or other significant mass effect. No hydrocephalus or extra-axial fluid collection. No age advanced or lobar predominant atrophy. Vascular: No evidence of chronic microhemorrhage or amyloid angiopathy. Skull and upper cervical spine: The visualized skull base, calvarium, upper cervical spine and extracranial soft tissues are normal. Sinuses/Orbits: No fluid levels or advanced mucosal thickening. No mastoid effusion. Normal orbits. MRA HEAD FINDINGS Intracranial internal carotid arteries: Normal. Anterior cerebral arteries: Normal. Middle cerebral arteries: There is near complete occlusion of the left middle cerebral artery at the proximal aspect of the M1 segment. No flow related enhancement seen within the distal left MCA distribution. The right middle cerebral artery is normal. Posterior communicating arteries: Present bilaterally. Posterior cerebral arteries: Normal. Basilar artery: Mild multifocal narrowing. Vertebral arteries: There is no flow related enhancement seen within left vertebral artery. Superior cerebellar arteries: Normal. Anterior inferior cerebellar arteries: Normal. Posterior inferior cerebellar arteries: Not visualized. MRA NECK FINDINGS Both carotid systems are normal. There is no dissection or hemodynamically significant stenosis. The right vertebral artery is normal in course and caliber. The left vertebral artery is diminutive with reduced contrast enhancement and near complete lack of enhancement of the V3 and V4 segments. Visualized aortic arch and proximal subclavian arteries are normal. There is a normal 3 vessel arch branching pattern. IMPRESSION: 1.  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 This is in keeping with reported left-sided symptoms. Additional area of suspected subacute ischemia within the right dentate nucleus. No hemorrhage or mass effect. 2. 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 No left MCA distribution diffusion abnormalities. 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 CT angiography might be helpful, as it is more sensitive for the visualization of very small collaterals. 3. Diminutive left vertebral artery with limited enhancement of the V3 and V4 segments, likely secondary to atherosclerotic disease. Critical Value/emergent results were called by telephone at the time of interpretation on 01/07/2016 at 7:00 pm to Dr. Hollice Gong, who verbally acknowledged these results. Electronically Signed   By: Ulyses Jarred M.D.   On: 01/07/2016 19:08   Mr Jeri Cos [id number] Contrast  Result Date: 01/07/2016 CLINICAL DATA:  Cerebral vascular accident. Numbness of the left lower extremity. Tingling in left hand EXAM: MRI HEAD WITHOUT AND WITH CONTRAST MRA HEAD WITHOUT CONTRAST MRA NECK WITHOUT AND WITH CONTRAST TECHNIQUE: Multiplanar, multiecho pulse sequences of the brain and surrounding structures were obtained without and with intravenous contrast. Angiographic images of the Circle of Willis were obtained using MRA technique without intravenous contrast. Angiographic images of the neck were obtained using MRA technique without and with  intravenous  contrast. Carotid stenosis measurements (when applicable) are obtained utilizing NASCET criteria, using the distal internal carotid diameter as the denominator. CONTRAST:  6mL MULTIHANCE GADOBENATE DIMEGLUMINE 529 MG/ML IV SOLN COMPARISON:  Head CT 01/07/2016 FINDINGS: MRI HEAD FINDINGS Brain: The midline structures are normal. There is focal diffusion restriction at the right caudate tail extending inferiorly along the course of the posterior limb of the right internal capsule. There is additional diffusion restriction at the right dentate nucleus, with normalized ADC values. There is right dentate nucleus T2 hyperintensity. There is multifocal hyperintense T2 weighted signal in the periventricular and deep white matter and along the distribution of the above-described diffusion restriction. There is no hemorrhage, midline shift or other significant mass effect. No hydrocephalus or extra-axial fluid collection. No age advanced or lobar predominant atrophy. Vascular: No evidence of chronic microhemorrhage or amyloid angiopathy. Skull and upper cervical spine: The visualized skull base, calvarium, upper cervical spine and extracranial soft tissues are normal. Sinuses/Orbits: No fluid levels or advanced mucosal thickening. No mastoid effusion. Normal orbits. MRA HEAD FINDINGS Intracranial internal carotid arteries: Normal. Anterior cerebral arteries: Normal. Middle cerebral arteries: There is near complete occlusion of the left middle cerebral artery at the proximal aspect of the M1 segment. No flow related enhancement seen within the distal left MCA distribution. The right middle cerebral artery is normal. Posterior communicating arteries: Present bilaterally. Posterior cerebral arteries: Normal. Basilar artery: Mild multifocal narrowing. Vertebral arteries: There is no flow related enhancement seen within left vertebral artery. Superior cerebellar arteries: Normal. Anterior inferior cerebellar arteries:  Normal. Posterior inferior cerebellar arteries: Not visualized. MRA NECK FINDINGS Both carotid systems are normal. There is no dissection or hemodynamically significant stenosis. The right vertebral artery is normal in course and caliber. The left vertebral artery is diminutive with reduced contrast enhancement and near complete lack of enhancement of the V3 and V4 segments. Visualized aortic arch and proximal subclavian arteries are normal. There is a normal 3 vessel arch branching pattern. IMPRESSION: 1. 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 This is in keeping with reported left-sided symptoms. Additional area of suspected subacute ischemia within the right dentate nucleus. No hemorrhage or mass effect. 2. 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 No left MCA distribution diffusion abnormalities. 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 CT angiography might be helpful, as it is more sensitive for the visualization of very small collaterals. 3. Diminutive left vertebral artery with limited enhancement of the V3 and V4 segments, likely secondary to atherosclerotic disease. Critical Value/emergent results were called by telephone at the time of interpretation on 01/07/2016 at 7:00 pm to Dr. Hollice Gong, who verbally acknowledged these results. Electronically Signed   By: Ulyses Jarred M.D.   On: 01/07/2016 19:08     LOS: 1 day   Oren Binet, MD  [facility] Pager:[phone]  If  7PM-7AM, please contact night-coverage [email or web address] Password [email or web address] 01/08/2016, 3:01 PM

## 2016-01-09 DIAGNOSIS — Z720 Tobacco use: Secondary | ICD-10-CM

## 2016-01-09 DIAGNOSIS — Z8673 Personal history of transient ischemic attack (TIA), and cerebral infarction without residual deficits: Secondary | ICD-10-CM

## 2016-01-09 LAB — HEMOGLOBIN A1C
Hgb A1c MFr Bld: 6.2 % — ABNORMAL HIGH (ref 4.8–5.6)
Mean Plasma Glucose: 131 mg/dL

## 2016-01-09 MED ORDER — NONFORMULARY OR COMPOUNDED ITEM: 0 refills | Status: DC

## 2016-01-09 MED ORDER — ASPIRIN 325 MG PO TABS
325.0000 mg | ORAL_TABLET | Freq: Every day | ORAL | 0 refills | Status: DC
Start: 2016-01-10 — End: 2016-02-10

## 2016-01-09 MED ORDER — ATORVASTATIN CALCIUM 80 MG PO TABS: 80.0000 mg | ORAL_TABLET | Freq: Every day | ORAL | 0 refills | Status: DC

## 2016-01-09 MED ORDER — LISINOPRIL 20 MG PO TABS: 20.0000 mg | ORAL_TABLET | Freq: Every day | ORAL | 0 refills | Status: DC

## 2016-01-09 NOTE — Progress Notes (Signed)
STROKE TEAM PROGRESS NOTE   HISTORY OF PRESENT ILLNESS (per record) Seth [name] is an 60 y.o. male with a past medical history significant for HTN with poor adherence to treatment (not taking his BP medications for at least 3 months), admitted for further stroke evaluation and management. Patient indicated that his symptoms started 5 days ago " as a tight, heavy sensation with limping and later on dragging of the left leg". Then, he also noticed " a very slight weakness when holding things with my left hand" and at that point he became concerned that he could be having a stroke and presented to the ED on 01/07/16. Patient denied associated HA, vertigo, double vision, slurred speech, language or vision impairment. MRI brain performed in the ED was independently reviewed and revealed small area of acute infarct in the right centrum semiovale extending along the posterior limb of the right internal capsule. There is also an area of suspected subacute ischemia within the right dentate nucleus. 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  Patient was not administered IV t-PA secondary to being outside window.  SUBJECTIVE (INTERVAL HISTORY) Patient was seen and examined this morning. He continues to have weakness and numbness of the left hand and left leg. He admits to not taking his blood pressure medications. He denies previous strokes. Overall he feels well and wants to go home.  OBJECTIVE Temp:  [98.2 F (36.8 C)-98.5 F (36.9 C)] 98.2 F (36.8 C) (12/15 0633) Pulse Rate:  [66-88] 88 (12/15 1034) Cardiac Rhythm: Normal sinus rhythm (12/15 0700) Resp:  [18] 18 (12/15 1034) BP: (139-181)/(79-101) 168/94 (12/15 0633) SpO2:  [98 %-100 %]  100 % (12/15 1034)  CBC:   Recent Labs Lab 01/07/16 1030  WBC 8.5  NEUTROABS 5.9  HGB 15.1  HCT 42.7  MCV 84.6  PLT [id number]    Basic Metabolic Panel:   Recent Labs Lab 01/07/16 1030  NA 140  K 3.9  CL 105  CO2 27  GLUCOSE 105*  BUN 10  CREATININE 1.07  CALCIUM 9.6    Lipid Panel:     Component Value Date/Time   CHOL 283 (H) 01/08/2016 0436   TRIG 96 01/08/2016 0436   HDL 42 01/08/2016 0436   CHOLHDL 6.7 01/08/2016 0436   VLDL 19 01/08/2016 0436   LDLCALC 222 (H) 01/08/2016 0436   HgbA1c:  Lab Results  Component Value Date   HGBA1C 6.2 (H) 01/08/2016    IMAGING  Mr Jodene Nam Head Wo Contrast  Result Date: 01/07/2016 CLINICAL DATA:  Cerebral vascular accident. Numbness of the left lower extremity. Tingling in left hand EXAM: MRI HEAD WITHOUT AND WITH CONTRAST MRA HEAD WITHOUT CONTRAST MRA NECK WITHOUT AND WITH CONTRAST TECHNIQUE: Multiplanar, multiecho pulse sequences of the brain and surrounding structures were obtained without and with intravenous contrast. Angiographic images of the Circle of Willis were obtained using MRA technique without intravenous contrast. Angiographic images of the neck were obtained using MRA technique without and with intravenous contrast. Carotid stenosis measurements (when applicable) are obtained utilizing NASCET criteria, using the distal internal carotid diameter as the denominator. CONTRAST:  85mL MULTIHANCE GADOBENATE DIMEGLUMINE 529 MG/ML IV SOLN COMPARISON:  Head CT 01/07/2016 FINDINGS: MRI HEAD FINDINGS Brain: The midline structures are normal. There is focal diffusion restriction at the right caudate  tail extending inferiorly along the course of the posterior limb of the right internal capsule. There is additional diffusion restriction at the right dentate nucleus, with normalized ADC values. There is right dentate nucleus T2 hyperintensity. There is multifocal hyperintense T2 weighted signal in the periventricular and deep white matter  and along the distribution of the above-described diffusion restriction. There is no hemorrhage, midline shift or other significant mass effect. No hydrocephalus or extra-axial fluid collection. No age advanced or lobar predominant atrophy. Vascular: No evidence of chronic microhemorrhage or amyloid angiopathy. Skull and upper cervical spine: The visualized skull base, calvarium, upper cervical spine and extracranial soft tissues are normal. Sinuses/Orbits: No fluid levels or advanced mucosal thickening. No mastoid effusion. Normal orbits. MRA HEAD FINDINGS Intracranial internal carotid arteries: Normal. Anterior cerebral arteries: Normal. Middle cerebral arteries: There is near complete occlusion of the left middle cerebral artery at the proximal aspect of the M1 segment. No flow related enhancement seen within the distal left MCA distribution. The right middle cerebral artery is normal. Posterior communicating arteries: Present bilaterally. Posterior cerebral arteries: Normal. Basilar artery: Mild multifocal narrowing. Vertebral arteries: There is no flow related enhancement seen within left vertebral artery. Superior cerebellar arteries: Normal. Anterior inferior cerebellar arteries: Normal. Posterior inferior cerebellar arteries: Not visualized. MRA NECK FINDINGS Both carotid systems are normal. There is no dissection or hemodynamically significant stenosis. The right vertebral artery is normal in course and caliber. The left vertebral artery is diminutive with reduced contrast enhancement and near complete lack of enhancement of the V3 and V4 segments. Visualized aortic arch and proximal subclavian arteries are normal. There is a normal 3 vessel arch branching pattern. IMPRESSION: 1. 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 This is in keeping with reported left-sided symptoms. Additional area of suspected subacute ischemia within the right  dentate nucleus. No hemorrhage or mass effect. 2. 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 No left MCA distribution diffusion abnormalities. 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 CT angiography might be helpful, as it is more sensitive for the visualization of very small collaterals. 3. Diminutive left vertebral artery with limited enhancement of the V3 and V4 segments, likely secondary to atherosclerotic disease. Critical Value/emergent results were called by telephone at the time of interpretation on 01/07/2016 at 7:00 pm to Dr. Hollice Gong, who verbally acknowledged these results. Electronically Signed   By: Ulyses Jarred M.D.   On: 01/07/2016 19:08   Mr Angiogram Neck W Or Wo Contrast  Result Date: 01/07/2016 CLINICAL DATA:  Cerebral vascular accident. Numbness of the left lower extremity. Tingling in left hand EXAM: MRI HEAD WITHOUT AND WITH CONTRAST MRA HEAD WITHOUT CONTRAST MRA NECK WITHOUT AND WITH CONTRAST TECHNIQUE: Multiplanar, multiecho pulse sequences of the brain and surrounding structures were obtained without and with intravenous contrast. Angiographic images of the Circle of Willis were obtained using MRA technique without intravenous contrast. Angiographic images of the neck were obtained using MRA technique without and with intravenous contrast. Carotid stenosis measurements (when applicable) are obtained utilizing NASCET criteria, using the distal internal carotid diameter as the denominator.  CONTRAST:  79mL MULTIHANCE GADOBENATE DIMEGLUMINE  529 MG/ML IV SOLN COMPARISON:  Head CT 01/07/2016 FINDINGS: MRI HEAD FINDINGS Brain: The midline structures are normal. There is focal diffusion restriction at the right caudate tail extending inferiorly along the course of the posterior limb of the right internal capsule. There is additional diffusion restriction at the right dentate nucleus, with normalized ADC values. There is right dentate nucleus T2 hyperintensity. There is multifocal hyperintense T2 weighted signal in the periventricular and deep white matter and along the distribution of the above-described diffusion restriction. There is no hemorrhage, midline shift or other significant mass effect. No hydrocephalus or extra-axial fluid collection. No age advanced or lobar predominant atrophy. Vascular: No evidence of chronic microhemorrhage or amyloid angiopathy. Skull and upper cervical spine: The visualized skull base, calvarium, upper cervical spine and extracranial soft tissues are normal. Sinuses/Orbits: No fluid levels or advanced mucosal thickening. No mastoid effusion. Normal orbits. MRA HEAD FINDINGS Intracranial internal carotid arteries: Normal. Anterior cerebral arteries: Normal. Middle cerebral arteries: There is near complete occlusion of the left middle cerebral artery at the proximal aspect of the M1 segment. No flow related enhancement seen within the distal left MCA distribution. The right middle cerebral artery is normal. Posterior communicating arteries: Present bilaterally. Posterior cerebral arteries: Normal. Basilar artery: Mild multifocal narrowing. Vertebral arteries: There is no flow related enhancement seen within left vertebral artery. Superior cerebellar arteries: Normal. Anterior inferior cerebellar arteries: Normal. Posterior inferior cerebellar arteries: Not visualized. MRA NECK FINDINGS Both carotid systems are normal. There is no dissection or hemodynamically significant stenosis. The right vertebral artery is normal in  course and caliber. The left vertebral artery is diminutive with reduced contrast enhancement and near complete lack of enhancement of the V3 and V4 segments. Visualized aortic arch and proximal subclavian arteries are normal. There is a normal 3 vessel arch branching pattern. IMPRESSION: 1. 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 This is in keeping with reported left-sided symptoms. Additional area of suspected subacute ischemia within the right dentate nucleus. No hemorrhage or mass effect. 2. 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 No left MCA distribution diffusion abnormalities. 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 CT angiography might be helpful, as it is more sensitive for the visualization of very small collaterals. 3. Diminutive left vertebral artery with limited enhancement of the V3 and V4 segments, likely secondary to atherosclerotic disease. Critical Value/emergent results were called by telephone at the time of interpretation on 01/07/2016 at 7:00 pm to Dr. Hollice Gong, who verbally acknowledged these results. Electronically Signed   By: Ulyses Jarred M.D.   On: 01/07/2016 19:08   Mr Jeri Cos [id number] Contrast  Result Date: 01/07/2016 CLINICAL DATA:  Cerebral vascular accident. Numbness of the left lower extremity. Tingling in left hand EXAM: MRI HEAD WITHOUT AND WITH CONTRAST MRA HEAD WITHOUT CONTRAST MRA NECK WITHOUT AND WITH CONTRAST TECHNIQUE: Multiplanar, multiecho pulse  sequences of the brain and surrounding structures were obtained without and with intravenous contrast. Angiographic images of the Circle of Willis were obtained using MRA technique without intravenous contrast. Angiographic  images of the neck were obtained using MRA technique without and with intravenous contrast. Carotid stenosis measurements (when applicable) are obtained utilizing NASCET criteria, using the distal internal carotid diameter as the denominator. CONTRAST:  19mL MULTIHANCE GADOBENATE DIMEGLUMINE 529 MG/ML IV SOLN COMPARISON:  Head CT 01/07/2016 FINDINGS: MRI HEAD FINDINGS Brain: The midline structures are normal. There is focal diffusion restriction at the right caudate tail extending inferiorly along the course of the posterior limb of the right internal capsule. There is additional diffusion restriction at the right dentate nucleus, with normalized ADC values. There is right dentate nucleus T2 hyperintensity. There is multifocal hyperintense T2 weighted signal in the periventricular and deep white matter and along the distribution of the above-described diffusion restriction. There is no hemorrhage, midline shift or other significant mass effect. No hydrocephalus or extra-axial fluid collection. No age advanced or lobar predominant atrophy. Vascular: No evidence of chronic microhemorrhage or amyloid angiopathy. Skull and upper cervical spine: The visualized skull base, calvarium, upper cervical spine and extracranial soft tissues are normal. Sinuses/Orbits: No fluid levels or advanced mucosal thickening. No mastoid effusion. Normal orbits. MRA HEAD FINDINGS Intracranial internal carotid arteries: Normal. Anterior cerebral arteries: Normal. Middle cerebral arteries: There is near complete occlusion of the left middle cerebral artery at the proximal aspect of the M1 segment. No flow related enhancement seen within the distal left MCA distribution. The right middle cerebral artery is normal. Posterior  communicating arteries: Present bilaterally. Posterior cerebral arteries: Normal. Basilar artery: Mild multifocal narrowing. Vertebral arteries: There is no flow related enhancement seen within left vertebral artery. Superior cerebellar arteries: Normal. Anterior inferior cerebellar arteries: Normal. Posterior inferior cerebellar arteries: Not visualized. MRA NECK FINDINGS Both carotid systems are normal. There is no dissection or hemodynamically significant stenosis. The right vertebral artery is normal in course and caliber. The left vertebral artery is diminutive with reduced contrast enhancement and near complete lack of enhancement of the V3 and V4 segments. Visualized aortic arch and proximal subclavian arteries are normal. There is a normal 3 vessel arch branching pattern. IMPRESSION: 1. 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 This is in keeping with reported left-sided symptoms. Additional area of suspected subacute ischemia within the right dentate nucleus. No hemorrhage or mass effect. 2. 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 No left MCA distribution diffusion abnormalities. 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 CT angiography might be helpful, as it is more sensitive for the visualization of very small collaterals. 3. Diminutive left vertebral artery with limited enhancement of the V3 and V4 segments, likely secondary to atherosclerotic disease. Critical Value/emergent results were  called by telephone at the time of interpretation on 01/07/2016 at 7:00 pm to Dr. Hollice Gong, who verbally acknowledged these results. Electronically Signed   By: Ulyses Jarred M.D.   On: 01/07/2016 19:08   PHYSICAL EXAM Vitals:   01/08/16 2150 01/09/16 0108 01/09/16 0633 01/09/16 1034  BP: 139/79 (!) 149/91 (!) 168/94   Pulse: 88 82 72 88  Resp: 18 18 18 18   Temp: 98.2 F (36.8 C) 98.5 F (36.9 C) 98.2  F (36.8 C)   TempSrc: Oral Oral Oral Oral  SpO2: 99% 99% 100% 100%  Weight:      Height:       General: Vital signs reviewed.  Patient is well-developed and well-nourished, in no acute distress and cooperative with exam.  Head: Normocephalic and atraumatic. Eyes: EOMI, conjunctivae normal.  Neck: Supple, trachea midline.  Cardiovascular: RRR Extremities: No lower extremity edema bilaterally Skin: Warm, dry and intact. No rashes or erythema. Psychiatric: Normal mood and affect. speech and behavior is normal. Cognition and memory are normal.  Neurological: A&O x3, Speech fluent without evidence of aphasia. Able to follow commands without difficulty. Cranial nerve II-XII are grossly intact. Mildly decreased strength in LUE and LLE. 5/5 strength in RUE and RLE. Decreased fine motor movements in left fingers. Gait tested and normal.  ASSESSMENT/PLAN KLEBER DIERSEN is an 60 y.o. male with a past medical history significant for HTN with poor adherence to treatment (not taking his BP medications for at least 3 months), admitted for LUE and LLE weakness and numbness and found to have a small area of acute infarct in the right centrum semiovale extending along the posterior limb of the right internal capsule.  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  Resultant Left Hand and Leg weakness and numbness  MRI  brain performed in the ED was independently reviewed and revealed small area of acute infarct in the right centrum semiovale extending along the posterior limb of the right internal capsule. There is also an area of suspected subacute ischemia within the right dentate nucleus.   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  2D Echo no cardiac source of emboli, severe LVH, no RWMA, grade 1 diastolic dysfunction  LDL 222  HgbA1c 6.2  SCDs for VTE prophylaxis Diet Heart Room service appropriate? Yes; Fluid consistency: Thin Diet - low sodium heart healthy  None  prior to admission, now on aspirin 325 mg daily  Patient counseled to be compliant with his antithrombotic medications  Ongoing aggressive stroke risk factor management  Therapy recommendations:  Outpatient PT/OT  Disposition:  Discharge to Home  Left Middle Cerebral Artery Stenosis  MRA showed subtotal stenosis of the left middle cerebral artery without visible collateralization within the left MCA distribution on the time-of-flight sequence, most likely chronic MCA stenosis with collateral flow via small/slow-flowing collaterals that are not apparent on time-of-flight imaging.  ASA and Atorvastatin as above   Hypertension  Stable; mildly elevated 140-160s/90s  Permissive hypertension (OK if < 220/120) but gradually normalize in 5-7 days  Long-term BP goal normotensive  Non-compliant with home lisinopril; would restart on discharge  Hyperlipidemia  LDL 222, goal < 70  Add atorvastatin 80 mg QD  Continue statin at discharge  Pre-Diabetes  HgbA1c 6.2, goal < 7.0  Controlled  Other Stroke Risk Factors  Cigarette smoker; advised to stop  smoking   Hospital day # [place], DO PGY-3 Internal Medicine Resident Pager # [phone] 01/09/2016 1:41 PM I have personally examined this patient, reviewed notes, independently viewed imaging studies, participated in medical decision making and plan of care.ROS completed by me  personally and pertinent positives fully documented  I have made any additions or clarifications directly to the above note. Agree with note above. Marland Kitchen He presented with one-week history of left-sided numbness and weakness but did not seek emergent medical help. MRI scan does show a subcortical right brain infarct secondary to small vessel disease. Patient was counseled to quit smoking and start taking aspirin every day and maintain aggressive risk factor modification. He was advised to find a primary care physician and to keep regular follow-up. Greater than 50% time during this 35 minute visit was spent on counseling and coordination of care about stroke risk, prevention and treatment  Antony Contras, MD Medical Director [place] Pager: [phone] 01/09/2016 1:54 PM   To contact Stroke Continuity provider, please refer to [email or web address] After hours, contact General Neurology

## 2016-01-09 NOTE — Progress Notes (Signed)
RN discussed discharge education with patient. Patient vocalized understanding of follow up appointments, medications, s/sx stroke, when to call 911, educated to call 911. Neuro assessment unchanged. Denies any pain at this time. [name] after [facility].

## 2016-01-09 NOTE — Care Management Note (Signed)
Case Management Note  Patient Details  Name: [name] MRN: [id number] Date of Birth: 04-Apr-1955  Subjective/Objective:   Pt currently without insurance but has a new job and insurance will be in effect in two weeks.  Provided [place] letter and pt will get scripts filled @ [facility].  States he was seen PTA @ [facility] by Seth Eagles, PA, and plans to f/u there.  Provided him with information about [facility] for hospital f/u appointment if he should need same.  Also provided him with contact information for [facility] and initial evaluation price per his request and he states he may call on Monday for an appointment - has script.                       Expected Discharge Plan:  Home/Self Care  Discharge planning Services  CM Consult, [facility]  Status of Service:  Completed, signed off  Girard Cooter, [place] 01/09/2016, 3:08 PM

## 2016-01-09 NOTE — Discharge Summary (Signed)
PATIENT DETAILS Name: Seth [name] Age: 60 y.o. Sex: male Date of Birth: 06/16/1955 MRN: [id number]. Admitting Physician: Etta Quill, DO PCP:No PCP Per Patient  Admit Date: 01/07/2016 Discharge date: 01/09/2016  Recommendations for Outpatient Follow-up:  1. Follow up with PCP in 1-2 weeks 2. Ensure follow up at the stroke clinic 3. Repeat lipid panel and A1c in 3 months 4. Please obtain BMP/CBC in one week   Admitted From:  Home  Disposition: [employer]: No  Equipment/Devices: None  Discharge Condition: Stable/   CODE STATUS: FULL CODE  Diet recommendation:  Heart Healthy   Brief Summary: See H&P, Labs, Consult and Test reports for all details in brief,Patient is a 60 y.o. male with hx of HTN, tobacco abuse admitted with left sided weakness that started approximately 5 days prior to admission. Found to with acute infarct in the right centrum semiovale. See below for further details.   Brief Hospital Course: Acute Ischemic CVA: left sided weakness has significantly improved. Symptoms started approximately 5 days prior to admission-hence not a TPA candidate. TTE without any embolic source-EF [id number]. Carotid Doppler negative for stenosis. LDL 222, A1C 6.2. Continue ASA, and Lipitor. Spoke with Dr. Marquette Old further recommendations at this time okay to discharge.   Dyslipidemia:continue Statin-see above  [id number] mild permissive HTN-will slowly initiate anti-hypertensives  Tobacco Abuse:counseled patient to quit  Procedures/Studies: None  Discharge Diagnoses:  Principal Problem:   Acute ischemic stroke [facility]) Active Problems:   HTN (hypertension)   Discharge Instructions:  Activity:  As tolerated with Full fall precautions use walker/cane & assistance as needed   Discharge Instructions    Ambulatory referral to Neurology    Complete by:  As directed    Diet - low sodium heart healthy    Complete by:  As directed    Discharge  instructions    Complete by:  As directed    Follow with Primary MD and Dr Leonie Man at the stroke clinic  Stop smoking!!  Please get a complete blood count and chemistry panel checked by your Primary MD at your next visit, and again as instructed by your Primary MD.  Get Medicines reviewed and adjusted: Please take all your medications with you for your next visit with your Primary MD  Laboratory/radiological data: Please request your Primary MD to go over all hospital tests and procedure/radiological results at the follow up, please ask your Primary MD to get all Hospital records sent to his/her office.  In some cases, they will be blood work, cultures and biopsy results pending at the time of your discharge. Please request that your primary care M.D. follows up on these results.  Also Note the following: If you experience worsening of your admission symptoms, develop shortness of breath, life threatening emergency, suicidal or homicidal thoughts you must seek medical attention immediately by calling 911 or calling your MD immediately  if symptoms less severe.  You must read complete instructions/literature along with all the possible adverse reactions/side effects for all the Medicines you take and that have been prescribed to you. Take any new Medicines after you have completely understood and accpet all the possible adverse reactions/side effects.   Do not drive when taking Pain medications or sleeping medications (Benzodaizepines)  Do not take more than prescribed Pain, Sleep and Anxiety Medications. It is not advisable to combine anxiety,sleep and pain medications without talking with your primary care practitioner  Special Instructions: If you have smoked or chewed Tobacco  in  the last 2 yrs please stop smoking, stop any regular Alcohol  and or any Recreational drug use.  Wear Seat belts while driving.  Please note: You were cared for by a hospitalist during your hospital stay. Once  you are discharged, your primary care physician will handle any further medical issues. Please note that NO REFILLS for any discharge medications will be authorized once you are discharged, as it is imperative that you return to your primary care physician (or establish a relationship with a primary care physician if you do not have one) for your post hospital discharge needs so that they can reassess your need for medications and monitor your lab values.   Increase activity slowly    Complete by:  As directed      Allergies as of 01/09/2016   No Known Allergies     Medication List    TAKE these medications   aspirin 325 MG tablet Take 1 tablet (325 mg total) by mouth daily. Start taking on:  01/10/2016   atorvastatin 80 MG tablet Commonly known as:  LIPITOR Take 1 tablet (80 mg total) by mouth daily at 6 PM.   lisinopril 20 MG tablet Commonly known as:  PRINIVIL,ZESTRIL Take 1 tablet (20 mg total) by mouth daily. What changed:  medication strength  how much to take   NONFORMULARY OR COMPOUNDED ITEM Please evaluate and treat for outpatient PT Diagnoses: Acute CVA   THERAFLU FLU & SORE THROAT 20-10-650 MG Pack Generic drug:  Pheniramine-PE-APAP Take 1 tablet by mouth daily as needed. Flu symptoms      Follow-up Information    SETHI,PRAMOD, MD. Schedule an appointment as soon as possible for a visit in 2 month(s).   Specialties:  Neurology, Radiology Why:  Office will call you for a follow-up appointment a month if you do not hear from them-please give them a call Contact information: [address] [place] [address] [place]. Schedule an appointment as soon as possible for a visit in 2 week(s).   Contact information: [place] [phone]         No Known Allergies  Consultations:   neurology     Other Procedures/Studies: Ct Head Wo  Contrast  Result Date: 01/07/2016 CLINICAL DATA:  Hypertension, left-sided leg and finger numbness EXAM: CT HEAD WITHOUT CONTRAST TECHNIQUE: Contiguous axial images were obtained from the base of the skull through the vertex without intravenous contrast. COMPARISON:  None. FINDINGS: Brain: The ventricular system is normal in size and configuration and the septum is in a normal midline position. The fourth ventricle and basilar cisterns are unremarkable. There is a right basal ganglial lacunar infarct present which may be acute or subacute. No hemorrhage is seen. Mild small vessel ischemic change is noted within the periventricular white matter. Vascular: No vascular abnormality is seen on this unenhanced study. Skull: No calvarial lesion is evident. Sinuses/Orbits: The paranasal sinuses is with a small probable retention cyst within the right partition of the sphenoid sinus. There also is a probable retention cyst medially within the left maxillary sinus. Other: None IMPRESSION: 1. Small lacunar infarct in the right basal ganglia may be acute or subacute. No hemorrhage. 2. Mild small vessel ischemic change in the periventricular white matter. 3. Sphenoid and left maxillary retention cysts. Electronically Signed   By: Ivar Drape M.D.   On: 01/07/2016 10:55   Mr Jodene Nam  Head Wo Contrast  Result Date: 01/07/2016 CLINICAL DATA:  Cerebral vascular accident. Numbness of the left lower extremity. Tingling in left hand EXAM: MRI HEAD WITHOUT AND WITH CONTRAST MRA HEAD WITHOUT CONTRAST MRA NECK WITHOUT AND WITH CONTRAST TECHNIQUE: Multiplanar, multiecho pulse sequences of the brain and surrounding structures were obtained without and with intravenous contrast. Angiographic images of the Circle of Willis were obtained using MRA technique without intravenous contrast. Angiographic images of the neck were obtained using MRA technique without and with intravenous contrast. Carotid stenosis measurements (when applicable) are  obtained utilizing NASCET criteria, using the distal internal carotid diameter as the denominator. CONTRAST:  60mL MULTIHANCE GADOBENATE DIMEGLUMINE 529 MG/ML IV SOLN COMPARISON:  Head CT 01/07/2016 FINDINGS: MRI HEAD FINDINGS Brain: The midline structures are normal. There is focal diffusion restriction at the right caudate tail extending inferiorly along the course of the posterior limb of the right internal capsule. There is additional diffusion restriction at the right dentate nucleus, with normalized ADC values. There is right dentate nucleus T2 hyperintensity. There is multifocal hyperintense T2 weighted signal in the periventricular and deep white matter and along the distribution of the above-described diffusion restriction. There is no hemorrhage, midline shift or other significant mass effect. No hydrocephalus or extra-axial fluid collection. No age advanced or lobar predominant atrophy. Vascular: No evidence of chronic microhemorrhage or amyloid angiopathy. Skull and upper cervical spine: The visualized skull base, calvarium, upper cervical spine and extracranial soft tissues are normal. Sinuses/Orbits: No fluid levels or advanced mucosal thickening. No mastoid effusion. Normal orbits. MRA HEAD FINDINGS Intracranial internal carotid arteries: Normal. Anterior cerebral arteries: Normal. Middle cerebral arteries: There is near complete occlusion of the left middle cerebral artery at the proximal aspect of the M1 segment. No flow related enhancement seen within the distal left MCA distribution. The right middle cerebral artery is normal. Posterior communicating arteries: Present bilaterally. Posterior cerebral arteries: Normal. Basilar artery: Mild multifocal narrowing. Vertebral arteries: There is no flow related enhancement seen within left vertebral artery. Superior cerebellar arteries: Normal. Anterior inferior cerebellar arteries: Normal. Posterior inferior cerebellar arteries: Not visualized. MRA NECK  FINDINGS Both carotid systems are normal. There is no dissection or hemodynamically significant stenosis. The right vertebral artery is normal in course and caliber. The left vertebral artery is diminutive with reduced contrast enhancement and near complete lack of enhancement of the V3 and V4 segments. Visualized aortic arch and proximal subclavian arteries are normal. There is a normal 3 vessel arch branching pattern. IMPRESSION: 1. 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 This is in keeping with reported left-sided symptoms. Additional area of suspected subacute ischemia within the right dentate nucleus. No hemorrhage or mass effect. 2. 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 No left MCA distribution diffusion abnormalities. 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 CT angiography might be helpful, as it is more sensitive for the visualization of very small collaterals. 3. Diminutive left vertebral artery with limited enhancement of the V3 and V4 segments, likely secondary to atherosclerotic disease. Critical Value/emergent results were called by telephone at the time of interpretation  on 01/07/2016 at 7:00 pm to Dr. Hollice Gong, who verbally acknowledged these results. Electronically Signed   By: Ulyses Jarred M.D.   On: 01/07/2016 19:08   Mr Angiogram Neck W Or Wo Contrast  Result Date: 01/07/2016 CLINICAL DATA:  Cerebral vascular accident. Numbness of the left lower extremity. Tingling in  left hand EXAM: MRI HEAD WITHOUT AND WITH CONTRAST MRA HEAD WITHOUT CONTRAST MRA NECK WITHOUT AND WITH CONTRAST TECHNIQUE: Multiplanar, multiecho pulse sequences of the brain and surrounding structures were obtained without and with intravenous contrast. Angiographic images of the Circle of Willis were obtained using MRA technique without intravenous contrast. Angiographic images of the neck were obtained using MRA technique without and with intravenous contrast. Carotid stenosis measurements (when applicable) are obtained utilizing NASCET criteria, using the distal internal carotid diameter as the denominator. CONTRAST:  56mL MULTIHANCE GADOBENATE DIMEGLUMINE 529 MG/ML IV SOLN COMPARISON:  Head CT 01/07/2016 FINDINGS: MRI HEAD FINDINGS Brain: The midline structures are normal. There is focal diffusion restriction at the right caudate tail extending inferiorly along the course of the posterior limb of the right internal capsule. There is additional diffusion restriction at the right dentate nucleus, with normalized ADC values. There is right dentate nucleus T2 hyperintensity. There is multifocal hyperintense T2 weighted signal in the periventricular and deep white matter and along the distribution of the above-described diffusion restriction. There is no hemorrhage, midline shift or other significant mass effect. No hydrocephalus or extra-axial fluid collection. No age advanced or lobar predominant atrophy. Vascular: No evidence of chronic microhemorrhage or amyloid angiopathy. Skull and upper cervical spine: The visualized skull base, calvarium, upper cervical spine and extracranial soft tissues are normal. Sinuses/Orbits: No fluid levels or advanced mucosal thickening. No mastoid effusion. Normal orbits. MRA HEAD FINDINGS Intracranial internal carotid arteries: Normal. Anterior cerebral arteries: Normal. Middle cerebral arteries: There is near complete occlusion of the left middle cerebral artery at the proximal  aspect of the M1 segment. No flow related enhancement seen within the distal left MCA distribution. The right middle cerebral artery is normal. Posterior communicating arteries: Present bilaterally. Posterior cerebral arteries: Normal. Basilar artery: Mild multifocal narrowing. Vertebral arteries: There is no flow related enhancement seen within left vertebral artery. Superior cerebellar arteries: Normal. Anterior inferior cerebellar arteries: Normal. Posterior inferior cerebellar arteries: Not visualized. MRA NECK FINDINGS Both carotid systems are normal. There is no dissection or hemodynamically significant stenosis. The right vertebral artery is normal in course and caliber. The left vertebral artery is diminutive with reduced contrast enhancement and near complete lack of enhancement of the V3 and V4 segments. Visualized aortic arch and proximal subclavian arteries are normal. There is a normal 3 vessel arch branching pattern. IMPRESSION: 1. 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 This is in keeping with reported left-sided symptoms. Additional area of suspected subacute ischemia within the right dentate nucleus. No hemorrhage or mass effect. 2. 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 No left MCA distribution diffusion abnormalities. 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 CT angiography might be helpful, as it is more sensitive for the visualization of  very small collaterals. 3. Diminutive left  vertebral artery with limited enhancement of the V3 and V4 segments, likely secondary to atherosclerotic disease. Critical Value/emergent results were called by telephone at the time of interpretation on 01/07/2016 at 7:00 pm to Dr. Hollice Gong, who verbally acknowledged these results. Electronically Signed   By: Ulyses Jarred M.D.   On: 01/07/2016 19:08   Mr Jeri Cos [id number] Contrast  Result Date: 01/07/2016 CLINICAL DATA:  Cerebral vascular accident. Numbness of the left lower extremity. Tingling in left hand EXAM: MRI HEAD WITHOUT AND WITH CONTRAST MRA HEAD WITHOUT CONTRAST MRA NECK WITHOUT AND WITH CONTRAST TECHNIQUE: Multiplanar, multiecho pulse sequences of the brain and surrounding structures were obtained without and with intravenous contrast. Angiographic images of the Circle of Willis were obtained using MRA technique without intravenous contrast. Angiographic images of the neck were obtained using MRA technique without and with intravenous contrast. Carotid stenosis measurements (when applicable) are obtained utilizing NASCET criteria, using the distal internal carotid diameter as the denominator. CONTRAST:  42mL MULTIHANCE GADOBENATE DIMEGLUMINE 529 MG/ML IV SOLN COMPARISON:  Head CT 01/07/2016 FINDINGS: MRI HEAD FINDINGS Brain: The midline structures are normal. There is focal diffusion restriction at the right caudate tail extending inferiorly along the course of the posterior limb of the right internal capsule. There is additional diffusion restriction at the right dentate nucleus, with normalized ADC values. There is right dentate nucleus T2 hyperintensity. There is multifocal hyperintense T2 weighted signal in the periventricular and deep white matter and along the distribution of the above-described diffusion restriction. There is no hemorrhage, midline shift or other significant mass effect. No hydrocephalus or extra-axial fluid collection. No age advanced or lobar predominant atrophy. Vascular:  No evidence of chronic microhemorrhage or amyloid angiopathy. Skull and upper cervical spine: The visualized skull base, calvarium, upper cervical spine and extracranial soft tissues are normal. Sinuses/Orbits: No fluid levels or advanced mucosal thickening. No mastoid effusion. Normal orbits. MRA HEAD FINDINGS Intracranial internal carotid arteries: Normal. Anterior cerebral arteries: Normal. Middle cerebral arteries: There is near complete occlusion of the left middle cerebral artery at the proximal aspect of the M1 segment. No flow related enhancement seen within the distal left MCA distribution. The right middle cerebral artery is normal. Posterior communicating arteries: Present bilaterally. Posterior cerebral arteries: Normal. Basilar artery: Mild multifocal narrowing. Vertebral arteries: There is no flow related enhancement seen within left vertebral artery. Superior cerebellar arteries: Normal. Anterior inferior cerebellar arteries: Normal. Posterior inferior cerebellar arteries: Not visualized. MRA NECK FINDINGS Both carotid systems are normal. There is no dissection or hemodynamically significant stenosis. The right vertebral artery is normal in course and caliber. The left vertebral artery is diminutive with reduced contrast enhancement and near complete lack of enhancement of the V3 and V4 segments. Visualized aortic arch and proximal subclavian arteries are normal. There is a normal 3 vessel arch branching pattern. IMPRESSION: 1. 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 This is in keeping with reported left-sided symptoms. Additional area of suspected subacute ischemia within the right dentate nucleus. No hemorrhage or mass effect. 2. 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 No left MCA distribution diffusion abnormalities.  Hyperacute thrombosis of the left MCA (in the brief period before diffusion weighted imaging abnormalities would appear, typically a matter of minutes) is a theoretical possibility; however, in the absence of symptoms of acute left MCA  infarct, I suspect chronic MCA stenosis with collateral flow via small/slow-flowing collaterals that are not apparent on time-of-flight imaging. CT angiography might be helpful, as it is more sensitive for the visualization of very small collaterals. 3. Diminutive left vertebral artery with limited enhancement of the V3 and V4 segments, likely secondary to atherosclerotic disease. Critical Value/emergent results were called by telephone at the time of interpretation on 01/07/2016 at 7:00 pm to Dr. Hollice Gong, who verbally acknowledged these results. Electronically Signed   By: Ulyses Jarred M.D.   On: 01/07/2016 19:08     TODAY-DAY OF DISCHARGE:  Subjective:   Seth [name] today has no headache,no chest abdominal pain,no new weakness tingling or numbness, feels much better wants to go home today.   Objective:   Blood pressure (!) 168/94, pulse 88, temperature 98.2 F (36.8 C), temperature source Oral, resp. rate 18, height 6\' 1"  (1.854 m), weight 76.4 kg (168 lb 6.9 oz), SpO2 100 %.  Intake/Output Summary (Last 24 hours) at 01/09/16 1302 Last data filed at 01/08/16 1347  Gross per 24 hour  Intake              240 ml  Output                0 ml  Net              240 ml   Filed Weights   01/07/16 2139  Weight: 76.4 kg (168 lb 6.9 oz)    Exam: Awake Alert, Oriented *3, No new F.N deficits, Normal affect Jolly.AT,PERRAL Supple Neck,No JVD, No cervical lymphadenopathy appriciated.  Symmetrical Chest wall movement, Good air movement bilaterally, CTAB RRR,No Gallops,Rubs or new Murmurs, No Parasternal Heave +ve B.Sounds, Abd Soft, Non tender, No organomegaly appriciated, No rebound -guarding or rigidity. No Cyanosis, Clubbing or edema, No new Rash or  bruise   PERTINENT RADIOLOGIC STUDIES: Ct Head Wo Contrast  Result Date: 01/07/2016 CLINICAL DATA:  Hypertension, left-sided leg and finger numbness EXAM: CT HEAD WITHOUT CONTRAST TECHNIQUE: Contiguous axial images were obtained from the base of the skull through the vertex without intravenous contrast. COMPARISON:  None. FINDINGS: Brain: The ventricular system is normal in size and configuration and the septum is in a normal midline position. The fourth ventricle and basilar cisterns are unremarkable. There is a right basal ganglial lacunar infarct present which may be acute or subacute. No hemorrhage is seen. Mild small vessel ischemic change is noted within the periventricular white matter. Vascular: No vascular abnormality is seen on this unenhanced study. Skull: No calvarial lesion is evident. Sinuses/Orbits: The paranasal sinuses is with a small probable retention cyst within the right partition of the sphenoid sinus. There also is a probable retention cyst medially within the left maxillary sinus. Other: None IMPRESSION: 1. Small lacunar infarct in the right basal ganglia may be acute or subacute. No hemorrhage. 2. Mild small vessel ischemic change in the periventricular white matter. 3. Sphenoid and left maxillary retention cysts. Electronically Signed   By: Ivar Drape M.D.   On: 01/07/2016 10:55   Mr Jodene Nam Head Wo Contrast  Result Date: 01/07/2016 CLINICAL DATA:  Cerebral vascular accident. Numbness of the left lower extremity. Tingling in left hand EXAM: MRI HEAD WITHOUT AND WITH CONTRAST MRA HEAD WITHOUT CONTRAST MRA NECK WITHOUT AND WITH CONTRAST TECHNIQUE: Multiplanar, multiecho pulse sequences of the brain and surrounding structures were obtained without and with intravenous contrast. Angiographic images of the Circle of Willis were obtained using MRA technique without  intravenous contrast. Angiographic images of the neck were obtained using MRA technique without and with intravenous  contrast. Carotid stenosis measurements (when applicable) are obtained utilizing NASCET criteria, using the distal internal carotid diameter as the denominator. CONTRAST:  21mL MULTIHANCE GADOBENATE DIMEGLUMINE 529 MG/ML IV SOLN COMPARISON:  Head CT 01/07/2016 FINDINGS: MRI HEAD FINDINGS Brain: The midline structures are normal. There is focal diffusion restriction at the right caudate tail extending inferiorly along the course of the posterior limb of the right internal capsule. There is additional diffusion restriction at the right dentate nucleus, with normalized ADC values. There is right dentate nucleus T2 hyperintensity. There is multifocal hyperintense T2 weighted signal in the periventricular and deep white matter and along the distribution of the above-described diffusion restriction. There is no hemorrhage, midline shift or other significant mass effect. No hydrocephalus or extra-axial fluid collection. No age advanced or lobar predominant atrophy. Vascular: No evidence of chronic microhemorrhage or amyloid angiopathy. Skull and upper cervical spine: The visualized skull base, calvarium, upper cervical spine and extracranial soft tissues are normal. Sinuses/Orbits: No fluid levels or advanced mucosal thickening. No mastoid effusion. Normal orbits. MRA HEAD FINDINGS Intracranial internal carotid arteries: Normal. Anterior cerebral arteries: Normal. Middle cerebral arteries: There is near complete occlusion of the left middle cerebral artery at the proximal aspect of the M1 segment. No flow related enhancement seen within the distal left MCA distribution. The right middle cerebral artery is normal. Posterior communicating arteries: Present bilaterally. Posterior cerebral arteries: Normal. Basilar artery: Mild multifocal narrowing. Vertebral arteries: There is no flow related enhancement seen within left vertebral artery. Superior cerebellar arteries: Normal. Anterior inferior cerebellar arteries: Normal.  Posterior inferior cerebellar arteries: Not visualized. MRA NECK FINDINGS Both carotid systems are normal. There is no dissection or hemodynamically significant stenosis. The right vertebral artery is normal in course and caliber. The left vertebral artery is diminutive with reduced contrast enhancement and near complete lack of enhancement of the V3 and V4 segments. Visualized aortic arch and proximal subclavian arteries are normal. There is a normal 3 vessel arch branching pattern. IMPRESSION: 1. 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 This is in keeping with reported left-sided symptoms. Additional area of suspected subacute ischemia within the right dentate nucleus. No hemorrhage or mass effect. 2. 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 No left MCA distribution diffusion abnormalities. 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 CT angiography might be helpful, as it is more sensitive for the visualization of very small collaterals. 3. Diminutive left vertebral artery with limited enhancement of the V3 and V4 segments, likely secondary to atherosclerotic disease. Critical Value/emergent results were called by telephone at the time of interpretation on 01/07/2016 at 7:00 pm to Dr. Hollice Gong, who verbally acknowledged these results. Electronically Signed   By: Ulyses Jarred M.D.   On: 01/07/2016 19:08   Mr Angiogram Neck W Or Wo Contrast  Result Date: 01/07/2016 CLINICAL DATA:  Cerebral  vascular accident. Numbness of the left lower extremity. Tingling in left hand EXAM: MRI HEAD WITHOUT AND WITH CONTRAST MRA HEAD WITHOUT CONTRAST MRA NECK  WITHOUT AND WITH CONTRAST TECHNIQUE: Multiplanar, multiecho pulse sequences of the brain and surrounding structures were obtained without and with intravenous contrast. Angiographic images of the Circle of Willis were obtained using MRA technique without intravenous contrast. Angiographic images of the neck were obtained using MRA technique without and with intravenous contrast. Carotid stenosis measurements (when applicable) are obtained utilizing NASCET criteria, using the distal internal carotid diameter as the denominator. CONTRAST:  62mL MULTIHANCE GADOBENATE DIMEGLUMINE 529 MG/ML IV SOLN COMPARISON:  Head CT 01/07/2016 FINDINGS: MRI HEAD FINDINGS Brain: The midline structures are normal. There is focal diffusion restriction at the right caudate tail extending inferiorly along the course of the posterior limb of the right internal capsule. There is additional diffusion restriction at the right dentate nucleus, with normalized ADC values. There is right dentate nucleus T2 hyperintensity. There is multifocal hyperintense T2 weighted signal in the periventricular and deep white matter and along the distribution of the above-described diffusion restriction. There is no hemorrhage, midline shift or other significant mass effect. No hydrocephalus or extra-axial fluid collection. No age advanced or lobar predominant atrophy. Vascular: No evidence of chronic microhemorrhage or amyloid angiopathy. Skull and upper cervical spine: The visualized skull base, calvarium, upper cervical spine and extracranial soft tissues are normal. Sinuses/Orbits: No fluid levels or advanced mucosal thickening. No mastoid effusion. Normal orbits. MRA HEAD FINDINGS Intracranial internal carotid arteries: Normal. Anterior cerebral arteries: Normal. Middle cerebral arteries: There is near  complete occlusion of the left middle cerebral artery at the proximal aspect of the M1 segment. No flow related enhancement seen within the distal left MCA distribution. The right middle cerebral artery is normal. Posterior communicating arteries: Present bilaterally. Posterior cerebral arteries: Normal. Basilar artery: Mild multifocal narrowing. Vertebral arteries: There is no flow related enhancement seen within left vertebral artery. Superior cerebellar arteries: Normal. Anterior inferior cerebellar arteries: Normal. Posterior inferior cerebellar arteries: Not visualized. MRA NECK FINDINGS Both carotid systems are normal. There is no dissection or hemodynamically significant stenosis. The right vertebral artery is normal in course and caliber. The left vertebral artery is diminutive with reduced contrast enhancement and near complete lack of enhancement of the V3 and V4 segments. Visualized aortic arch and proximal subclavian arteries are normal. There is a normal 3 vessel arch branching pattern. IMPRESSION: 1. 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 This is in keeping with reported left-sided symptoms. Additional area of suspected subacute ischemia within the right dentate nucleus. No hemorrhage or mass effect. 2. 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 No left MCA distribution diffusion abnormalities. 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 CT angiography might be helpful, as it is more sensitive  for the visualization of very small collaterals. 3. Diminutive left vertebral artery with limited enhancement of the V3 and V4 segments, likely secondary to atherosclerotic disease. Critical Value/emergent results were called by telephone at the time of interpretation on 01/07/2016 at 7:00 pm to Dr. Hollice Gong, who verbally acknowledged these results. Electronically Signed   By: Ulyses Jarred M.D.   On: 01/07/2016 19:08   Mr Jeri Cos [id number] Contrast  Result Date: 01/07/2016 CLINICAL DATA:  Cerebral vascular accident. Numbness of the left lower extremity. Tingling in left hand EXAM: MRI HEAD WITHOUT AND WITH CONTRAST MRA HEAD WITHOUT CONTRAST MRA NECK WITHOUT AND WITH CONTRAST TECHNIQUE: Multiplanar, multiecho pulse sequences of the brain and surrounding structures were obtained without and with intravenous contrast. Angiographic images of the Circle of Willis were obtained using MRA technique without intravenous contrast. Angiographic images of the neck were obtained using MRA technique without and with intravenous contrast. Carotid stenosis measurements (when applicable) are obtained utilizing NASCET criteria, using the distal internal carotid diameter as the denominator. CONTRAST:  40mL MULTIHANCE GADOBENATE DIMEGLUMINE 529 MG/ML IV SOLN COMPARISON:  Head CT 01/07/2016 FINDINGS: MRI HEAD FINDINGS Brain: The midline structures are normal. There is focal diffusion restriction at the right caudate tail extending inferiorly along the course of the posterior limb of the right internal capsule. There is additional diffusion restriction at the right dentate nucleus, with normalized ADC values. There is right dentate nucleus T2 hyperintensity. There is multifocal hyperintense T2 weighted signal in the periventricular and deep white matter and along the distribution of the above-described diffusion restriction. There is no hemorrhage, midline shift or other significant mass effect. No hydrocephalus or extra-axial fluid  collection. No age advanced or lobar predominant atrophy. Vascular: No evidence of chronic microhemorrhage or amyloid angiopathy. Skull and upper cervical spine: The visualized skull base, calvarium, upper cervical spine and extracranial soft tissues are normal. Sinuses/Orbits: No fluid levels or advanced mucosal thickening. No mastoid effusion. Normal orbits. MRA HEAD FINDINGS Intracranial internal carotid arteries: Normal. Anterior cerebral arteries: Normal. Middle cerebral arteries: There is near complete occlusion of the left middle cerebral artery at the proximal aspect of the M1 segment. No flow related enhancement seen within the distal left MCA distribution. The right middle cerebral artery is normal. Posterior communicating arteries: Present bilaterally. Posterior cerebral arteries: Normal. Basilar artery: Mild multifocal narrowing. Vertebral arteries: There is no flow related enhancement seen within left vertebral artery. Superior cerebellar arteries: Normal. Anterior inferior cerebellar arteries: Normal. Posterior inferior cerebellar arteries: Not visualized. MRA NECK FINDINGS Both carotid systems are normal. There is no dissection or hemodynamically significant stenosis. The right vertebral artery is normal in course and caliber. The left vertebral artery is diminutive with reduced contrast enhancement and near complete lack of enhancement of the V3 and V4 segments. Visualized aortic arch and proximal subclavian arteries are normal. There is a normal 3 vessel arch branching pattern. IMPRESSION: 1. 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 This is in keeping with reported left-sided symptoms. Additional area of suspected subacute ischemia within the right dentate nucleus. No hemorrhage or mass effect. 2. 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 No left MCA distribution diffusion abnormalities. 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 CT angiography might be helpful, as it is more sensitive for the visualization of very small collaterals. 3. Diminutive left vertebral artery with limited enhancement of the V3 and V4 segments, likely secondary to atherosclerotic disease. Critical Value/emergent results were called by telephone at the time of interpretation on 01/07/2016 at 7:00  pm to Dr. Hollice Gong, who verbally acknowledged these results. Electronically Signed   By: Ulyses Jarred M.D.   On: 01/07/2016 19:08     PERTINENT LAB RESULTS: CBC:  Recent Labs  01/07/16 1030  WBC 8.5  HGB 15.1  HCT 42.7  PLT 229   CMET CMP     Component Value Date/Time   NA 140 01/07/2016 1030   K 3.9 01/07/2016 1030   CL 105 01/07/2016 1030   CO2 27 01/07/2016 1030   GLUCOSE 105 (H) 01/07/2016 1030   BUN 10 01/07/2016 1030   CREATININE 1.07 01/07/2016 1030   CALCIUM 9.6 01/07/2016 1030   PROT 7.1 01/07/2016 1030   ALBUMIN 4.1 01/07/2016 1030   AST 23 01/07/2016 1030   ALT 20 01/07/2016 1030   ALKPHOS 70 01/07/2016 1030   BILITOT 1.1 01/07/2016 1030   GFRNONAA >60 01/07/2016 1030   GFRAA >60 01/07/2016 1030    GFR Estimated Creatinine Clearance (by C-G formula based on SCr of 1.07 mg/dL) Male: 65.7 mL/min Male: 78.3 mL/min No results for input(s): LIPASE, AMYLASE in the last 72 hours. No results for input(s): CKTOTAL, CKMB, CKMBINDEX, TROPONINI in the last 72 hours. Invalid input(s): POCBNP No results for input(s): DDIMER in the last 72 hours.  Recent Labs  01/08/16 0436    HGBA1C 6.2*    Recent Labs  01/08/16 0436  CHOL 283*  HDL 42  LDLCALC 222*  TRIG 96  CHOLHDL 6.7   No results for input(s): TSH, T4TOTAL, T3FREE, THYROIDAB in the last 72 hours.  Invalid input(s): FREET3 No results for input(s): VITAMINB12, FOLATE, FERRITIN, TIBC, IRON, RETICCTPCT in the last 72 hours. Coags:  Recent Labs  01/07/16 1030  INR 1.01   Microbiology: No results found for this or any previous visit (from the past 240 hour(s)).  FURTHER DISCHARGE INSTRUCTIONS:  Get Medicines reviewed and adjusted: Please take all your medications with you for your next visit with your Primary MD  Laboratory/radiological data: Please request your Primary MD to go over all hospital tests and procedure/radiological results at the follow up, please ask your Primary MD to get all Hospital records sent to his/her office.  In some cases, they will be blood work, cultures and biopsy results pending at the time of your discharge. Please request that your primary care M.D. goes through all the records of your hospital data and follows up on these results.  Also Note the following: If you experience worsening of your admission symptoms, develop shortness of breath, life threatening emergency, suicidal or homicidal thoughts you must seek medical attention immediately by calling 911 or calling your MD immediately  if symptoms less severe.  You must read complete instructions/literature along with all the possible adverse reactions/side effects for all the Medicines you take and that have been prescribed to you. Take any new Medicines after you have completely understood and accpet all the possible adverse reactions/side effects.   Do not drive when taking Pain medications or sleeping medications (Benzodaizepines)  Do not take more than prescribed Pain, Sleep and Anxiety Medications. It is not advisable to combine anxiety,sleep and pain medications without talking with your primary care  practitioner  Special Instructions: If you have smoked or chewed Tobacco  in the last 2 yrs please stop smoking, stop any regular Alcohol  and or any Recreational drug use.  Wear Seat belts while driving.  Please note: You were cared for by a hospitalist during your hospital stay. Once you are discharged, your primary  care physician will handle any further medical issues. Please note that NO REFILLS for any discharge medications will be authorized once you are discharged, as it is imperative that you return to your primary care physician (or establish a relationship with a primary care physician if you do not have one) for your post hospital discharge needs so that they can reassess your need for medications and monitor your lab values.  Total Time spent coordinating discharge including counseling, education and face to face time equals  45 minutes.  SignedOren Binet 01/09/2016 1:02 PM

## 2016-01-09 NOTE — Progress Notes (Signed)
Patient notified that he may schedule appt starting Tuesday for pcp clinic

## 2016-01-09 NOTE — Progress Notes (Signed)
Occupational Therapy Treatment Patient Details Name: Seth [name] MRN: [id number] DOB: 1955-08-06 Today's Date: 01/09/2016    History of present illness 60 y.o. male with a past medical history significant for HTN with poor adherence to treatment (no taking his BP medications for at least 3 months), admitted for further stroke evaluation and management. MRI revealed small area of acute infarct in the right centrum semiovale extending along the posterior limb of the right internal capsule. There is also aarea of suspected subacute ischemia within the right dentate nucleus. 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   OT comments  Pt progressing towards acute OT goals. Focus of session was tub transfer, LB dressing, and HEP for residual FMC deficits. Session details below. D/c plan updated to include OP OT.    Follow Up Recommendations  Outpatient OT;Supervision - Intermittent    Equipment Recommendations  3 in 1 bedside commode    Recommendations for Other Services      Precautions / Restrictions Precautions Precautions: Fall Restrictions Weight Bearing Restrictions: No       Mobility Bed Mobility               General bed mobility comments: OOB  Transfers Overall transfer level: Needs assistance     Sit to Stand: Modified independent (Device/Increase time)              Balance                                   ADL Overall ADL's : Needs assistance/impaired                     Lower Body Dressing: Min guard;Sit to/from stand Lower Body Dressing Details (indicate cue type and reason): Pt initially donning initiating donning pants in the middle of room then self-corrected and sat in  chair.  Discussed LB dressing technique. Toilet Transfer: Modified Independent;Ambulation       Tub/ Shower Transfer: Tub transfer;Supervision/safety;Ambulation;3 in 1 Tub/Shower Transfer Details (indicate cue type and reason): Practiced tub transfer using wall for support. Discussed fall prevention.  Functional mobility during ADLs: Modified independent General ADL Comments: Pt completed household distance functional mobility, tub transfer and LB dressing as detailed above. Educated on fall prevention strategies.       Vision                     Perception     Praxis      Cognition   Behavior During Therapy: [facility] for tasks assessed/performed Overall Cognitive Status: Within Functional Limits for tasks assessed                  General Comments: SLP eval revealed higher level cognitive deficits. baseline vs acute?     Extremity/Trunk Assessment               Exercises Other Exercises Other Exercises: Isused theraputty and HEP for higher level FMC deficits in L hand.   Shoulder Instructions       General Comments      Pertinent Vitals/ Pain       Pain Assessment: Faces Faces Pain Scale: No hurt  Home Living  Prior Functioning/Environment              Frequency  Min 2X/week        Progress Toward Goals  OT Goals(current goals can now be found in the care plan section)  Progress towards OT goals: Progressing toward goals  Acute Rehab OT Goals Patient Stated Goal: home  OT Goal Formulation: With patient Time For Goal Achievement: 01/15/16 Potential to Achieve Goals: Good ADL Goals Pt Will Perform Grooming: with modified independence Pt Will Perform Lower Body Bathing: with modified independence;sit to/from stand Pt Will Perform Lower Body Dressing: with modified independence;sit to/from stand Pt Will Perform Tub/Shower Transfer: Tub transfer;with modified  independence;ambulating;3 in 1  Plan Discharge plan needs to be updated    Co-evaluation                 End of Session Equipment Utilized During Treatment: Gait belt   Activity Tolerance Patient tolerated treatment well   Patient Left in bed;with call bell/phone within reach   Nurse Communication          Time: 1016-1030 OT Time Calculation (min): 14 min  Charges: OT General Charges $OT Visit: 1 Procedure OT Treatments $Self Care/Home Management : 8-22 mins  Hortencia Pilar 01/09/2016, 10:48 AM

## 2016-01-22 ENCOUNTER — Telehealth: Payer: Self-pay | Admitting: General Practice

## 2016-01-22 NOTE — Telephone Encounter (Signed)
APT. REMINDER CALL, WE HAVE WRONG NUMBER IN SYSTEM

## 2016-01-27 ENCOUNTER — Encounter: Payer: Self-pay | Admitting: General Practice

## 2016-01-27 ENCOUNTER — Ambulatory Visit: Payer: Self-pay

## 2016-02-06 ENCOUNTER — Telehealth: Payer: Self-pay | Admitting: General Practice

## 2016-02-06 NOTE — Telephone Encounter (Signed)
APT. REMINDER CALL, LMTCB °

## 2016-02-10 ENCOUNTER — Ambulatory Visit (INDEPENDENT_AMBULATORY_CARE_PROVIDER_SITE_OTHER): Payer: BLUE CROSS/BLUE SHIELD | Admitting: Internal Medicine

## 2016-02-10 ENCOUNTER — Encounter: Payer: Self-pay | Admitting: Internal Medicine

## 2016-02-10 VITALS — BP 197/129 | HR 72 | Temp 98.3°F | Wt 181.5 lb

## 2016-02-10 DIAGNOSIS — Z09 Encounter for follow-up examination after completed treatment for conditions other than malignant neoplasm: Secondary | ICD-10-CM | POA: Diagnosis not present

## 2016-02-10 DIAGNOSIS — F17211 Nicotine dependence, cigarettes, in remission: Secondary | ICD-10-CM

## 2016-02-10 DIAGNOSIS — Z79899 Other long term (current) drug therapy: Secondary | ICD-10-CM

## 2016-02-10 DIAGNOSIS — Z833 Family history of diabetes mellitus: Secondary | ICD-10-CM

## 2016-02-10 DIAGNOSIS — I10 Essential (primary) hypertension: Secondary | ICD-10-CM

## 2016-02-10 DIAGNOSIS — Z720 Tobacco use: Secondary | ICD-10-CM

## 2016-02-10 DIAGNOSIS — Z8673 Personal history of transient ischemic attack (TIA), and cerebral infarction without residual deficits: Secondary | ICD-10-CM

## 2016-02-10 DIAGNOSIS — Z823 Family history of stroke: Secondary | ICD-10-CM

## 2016-02-10 DIAGNOSIS — F17200 Nicotine dependence, unspecified, uncomplicated: Secondary | ICD-10-CM | POA: Insufficient documentation

## 2016-02-10 DIAGNOSIS — Z8249 Family history of ischemic heart disease and other diseases of the circulatory system: Secondary | ICD-10-CM

## 2016-02-10 DIAGNOSIS — Z0000 Encounter for general adult medical examination without abnormal findings: Secondary | ICD-10-CM | POA: Insufficient documentation

## 2016-02-10 MED ORDER — ATORVASTATIN CALCIUM 80 MG PO TABS: 80.0000 mg | ORAL_TABLET | Freq: Every day | ORAL | 2 refills | Status: DC

## 2016-02-10 MED ORDER — LISINOPRIL 20 MG PO TABS: 20.0000 mg | ORAL_TABLET | Freq: Every day | ORAL | 0 refills | Status: DC

## 2016-02-10 MED ORDER — HYDROCHLOROTHIAZIDE 12.5 MG PO TABS: 25.0000 mg | ORAL_TABLET | Freq: Every day | ORAL | 2 refills | Status: DC

## 2016-02-10 MED ORDER — ASPIRIN 81 MG PO TABS: 81.0000 mg | ORAL_TABLET | Freq: Every day | ORAL | 2 refills | Status: DC

## 2016-02-10 MED ORDER — HYDROCHLOROTHIAZIDE 25 MG PO TABS: 25.0000 mg | ORAL_TABLET | Freq: Every day | ORAL | 2 refills | Status: DC

## 2016-02-10 NOTE — Assessment & Plan Note (Addendum)
[name] was recently admitted to [facility] last month with left sided weakness and found to have an acute infarct of his right centrum semi ovale. He is here today for hospital follow up and to establish care.   His symptoms of left sided weakness began approximately 5 days prior to admission and thus was not a tPA candidate. He had not been taking any anti-hypertensive medications for 3 months prior. His MRI revealed small area of acute infarct in the right centrum semiovale extending along the posterior limb of the right internal capsule. There was also an area of suspected subacute ischemia within the right dentate nucleus. 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   His 2D ECHO showed EF 50-55%, no cardiac source of emboli, severe LVH, no regional wall motion abnormalities and grade 1 diastolic dysfunction. Hgb A1c was in the pre-diabetic range at 6.2. His lipid panel showed total cholesterol 283, HDL 96, LDL 222.   His left sided weakness significantly improved and he was started on ASA 325 mg daily, Lipitor 80 mg daily, Lisinopril 20 mg daily. His blood pressure at discharge was still elevated at 168/94.   Today, he reports that his weakness has resolved and has not had any further symptoms. No weakness, dizziness/lightheadedness, vision changes, slurred speech. Has had headaches the past two days. Across his forehead, dull aching and went away on its own. Denies any other symptoms. Does not check his blood pressure at home. He reports compliance with his medications but reports that he took his last pills last night and needs refills today. BP today is 197/129. Reports he has quit smoking today. Has not smoked since getting  out of the hospital.   Plan -Re-check A1c in 2 months as it was abnormal -Congratulated on the smoking cessation and encouraged continued abstinence  -Needs better BP control. Will check BMET today, continue Lisinopril 20 mg daily and start HCTZ 25 mg daily. -Decrease ASA 325 mg daily to 81 mg daily.  -Continue Atorvastatin 80 mg daily. Re-check lipid panel in 2 weeks.  -F/U in clinic in 2 weeks

## 2016-02-10 NOTE — Progress Notes (Signed)
CC: Hospital Follow Up -CVA  HPI:  Mr.Rafik A Lonia Blood is a 61 y.o. 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 He is here today for hospital follow up and to establish care.    His symptoms of left sided weakness began approximately 5 days prior to admission and thus was not a tPA candidate. He had not been taking any anti-hypertensive medications for 3 months prior. His MRI revealed small area of acute infarct in the right centrum semiovale extending along the posterior limb of the right internal capsule. There was also an area of suspected subacute ischemia within the right dentate nucleus. 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   His 2D ECHO showed EF 50-55%, no cardiac source of emboli, severe LVH, no regional wall motion abnormalities and grade 1 diastolic dysfunction. Hgb A1c was in the pre-diabetic range at 6.2. His lipid panel showed total cholesterol 283, HDL 96, LDL 222.   His left sided weakness significantly improved and he was started on ASA 325 mg daily, Lipitor 80 mg daily, Lisinopril 20 mg daily. His blood pressure at discharge was still elevated at 168/94.   Today, he reports that his weakness has resolved and has not had any further symptoms. No weakness, dizziness/lightheadedness, vision changes, slurred speech. Has had headaches the past two days. Across his forehead, dull aching and went away on its own. Denies any other symptoms. Does not check his blood pressure at home. He reports compliance with his medications but reports that he took  his last pills last night and needs refills today. BP today is 197/129. Reports he has quit smoking today. Has not smoked since getting out of the hospital.   Past Medical History:  Diagnosis Date  . CVA (cerebral vascular accident) ([place]) 12/2015  . Hypertension    Past Surgical History:  Procedure Laterality Date  . CYST REMOVAL LEG     Family History  Problem Relation Age of Onset  . Diabetes Mother   . Hypertension Mother   . Hypertension Father   . Stroke Paternal Grandmother    Social History   Social History  . Marital status: Single    Spouse name: N/A  . Number of children: N/A  . Years of education: N/A   Social History Main Topics  . Smoking status: Former Smoker    Packs/day: 1.00    Years: 45.00    Quit date: 01/07/2016  . Smokeless tobacco: Never Used  . Alcohol use 0.6 oz/week    1 Shots of liquor per week     Comment: once 2-3 months  . Drug use: No     Comment: in the past   . Sexual activity: No   Other Topics Concern  . None   Social History Narrative  . None    Review of Systems:   Review of Systems  Constitutional: Negative for chills, fever and malaise/fatigue.  Eyes: Negative for blurred vision.  Respiratory: Negative for cough.   Cardiovascular: Negative for chest pain and palpitations.  Gastrointestinal: Negative for abdominal pain, constipation, diarrhea, nausea and vomiting.  Genitourinary: Negative for dysuria.  Musculoskeletal: Negative for falls and myalgias.  Skin: Negative for rash.  Neurological: Negative for dizziness, sensory change, speech change, focal weakness, seizures and headaches.  Psychiatric/Behavioral: Negative for depression.   Physical Exam:  Vitals:   02/10/16 0842  BP: (!) 197/129  Pulse: 72  Temp: 98.3 F (36.8 C)  TempSrc: Oral  SpO2: 100%  Weight: 181 lb 8 oz (82.3 kg)    Physical Exam  Constitutional: He is oriented to person, place, and time and well-developed, well-nourished, and in no  distress. No distress.  HENT:  Head: Normocephalic and atraumatic.  Eyes: EOM are normal. Pupils are equal, round, and reactive to light.  Neck: Normal range of motion.  Cardiovascular: Normal rate, regular rhythm and normal heart sounds.  Exam reveals no gallop and no friction rub.   No murmur heard. Pulmonary/Chest: Effort normal and breath sounds normal. No respiratory distress. He has no wheezes.  Abdominal: Soft. Bowel sounds are normal. He exhibits no distension. There is no tenderness.  Musculoskeletal: Normal range of motion. He exhibits no edema.  Neurological: He is alert and oriented to person, place, and time. He displays normal reflexes. No cranial nerve deficit. He exhibits normal muscle tone. Coordination normal.  5/5 strength in bilateral upper extremities. 5/5 strength in RLE. 4+/5 strength in LLE. Intact normal sensation throughout.   Skin: Skin is warm and dry. No rash noted.  Psychiatric: Mood and affect normal.   Assessment & Plan:   See Encounters Tab for problem based charting.  Patient discussed with Dr. Eppie Gibson

## 2016-02-10 NOTE — Assessment & Plan Note (Signed)
BP Readings from Last 3 Encounters:  02/10/16 (!) 197/129  01/09/16 (!) 168/94  01/07/16 (!) 174/104    Lab Results  Component Value Date   NA 140 01/07/2016   K 3.9 01/07/2016   CREATININE 1.07 01/07/2016    Assessment: Long standing history of uncontrolled BP. BP toady is 197/129. No weakness, dizziness/lightheadedness, vision changes, slurred speech. Has had headaches the past two days. Across his forehead, dull aching and went away on its own. Denies any other symptoms. Does not check his blood pressure at home. He reports compliance with his medications but reports that he took his last pills last night and needs refills today.   Plan: Check BMET today Continue Lisinopril 20 mg daily Start HCTZ 25 mg daily Asked to check his BP at home and keep a log RTC in 2 weeks for follow up

## 2016-02-10 NOTE — Patient Instructions (Signed)
[name],  Seth [name] work on quitting smoking! You've already done one of the biggest things you can do to help prevent any future strokes. As we talked about, my other biggest concern is your [name] pressure. You are already on the Lisinopril 20 mg daily right now. I am going to check some [name] work today to make sure everything is still looking okay on that medication. If everything looks good, I will give you a call in the next 1-2 days and likely have you start taking 40 mg daily. I am also going to have you start on another [name] pressure medication called Hydrochlorothiazide (HCTZ) 25 mg daily. As we discussed, take both these medications before going to bed.  I would like to see you back in 2 weeks for follow up.

## 2016-02-10 NOTE — Assessment & Plan Note (Signed)
Reports smoking 1 PPD x 45 years. Says that he has not smoked since his hospitalization a month ago. Denies any cravings or desire to smoke today.  Congratulated on his smoking cessation and encouraged continued cessation.

## 2016-02-10 NOTE — Progress Notes (Signed)
Case discussed with Dr. Boswell at the time of the visit. We reviewed the resident's history and exam and pertinent patient test results. I agree with the assessment, diagnosis, and plan of care documented in the resident's note. 

## 2016-02-11 LAB — BMP8+ANION GAP
Anion Gap: 18 mmol/L (ref 10.0–18.0)
BUN/Creatinine Ratio: 8 — ABNORMAL LOW (ref 10–24)
BUN: 8 mg/dL (ref 8–27)
CALCIUM: 9.5 mg/dL (ref 8.6–10.2)
CO2: 24 mmol/L (ref 18–29)
CREATININE: 0.99 mg/dL (ref 0.76–1.27)
Chloride: 101 mmol/L (ref 96–106)
GFR calc Af Amer: 95 mL/min/{1.73_m2} (ref >59)
GFR calc non Af Amer: 82 mL/min/{1.73_m2} (ref >59)
Glucose: 119 mg/dL — ABNORMAL HIGH (ref 65–99)
POTASSIUM: 3.7 mmol/L (ref 3.5–5.2)
SODIUM: 143 mmol/L (ref 134–144)

## 2016-02-24 ENCOUNTER — Ambulatory Visit (INDEPENDENT_AMBULATORY_CARE_PROVIDER_SITE_OTHER): Payer: BLUE CROSS/BLUE SHIELD | Admitting: Internal Medicine

## 2016-02-24 DIAGNOSIS — I10 Essential (primary) hypertension: Secondary | ICD-10-CM

## 2016-02-24 DIAGNOSIS — F17211 Nicotine dependence, cigarettes, in remission: Secondary | ICD-10-CM | POA: Diagnosis not present

## 2016-02-24 DIAGNOSIS — Z79899 Other long term (current) drug therapy: Secondary | ICD-10-CM | POA: Diagnosis not present

## 2016-02-24 MED ORDER — LISINOPRIL 40 MG PO TABS: 20.0000 mg | ORAL_TABLET | Freq: Every day | ORAL | 2 refills | Status: DC

## 2016-02-24 MED ORDER — AMLODIPINE BESYLATE 5 MG PO TABS: 5.0000 mg | ORAL_TABLET | Freq: Every day | ORAL | 1 refills | Status: DC

## 2016-02-24 NOTE — Patient Instructions (Signed)
[name],   Please increase the dose of the lisinopril to 40 mg daily. I am also going to start you on a new medication called amlodipine 5 mg daily.  Please follow up in 2 weeks.   Please schedule an appointment to see me on March 14.  Amlodipine tablets What is this medicine? AMLODIPINE (am LOE di peen) is a calcium-channel blocker. It affects the amount of calcium found in your heart and muscle cells. This relaxes your blood vessels, which can reduce the amount of work the heart has to do. This medicine is used to lower high blood pressure. It is also used to prevent chest pain. This medicine may be used for other purposes; ask your health care provider or pharmacist if you have questions. COMMON BRAND NAME(S): Norvasc What should I tell my health care provider before I take this medicine? They need to know if you have any of these conditions: -heart problems like heart failure or aortic stenosis -liver disease -an unusual or allergic reaction to amlodipine, other medicines, foods, dyes, or preservatives -pregnant or trying to get pregnant -breast-feeding How should I use this medicine? Take this medicine by mouth with a glass of water. Follow the directions on the prescription label. Take your medicine at regular intervals. Do not take more medicine than directed. Talk to your pediatrician regarding the use of this medicine in children. Special care may be needed. This medicine has been used in children as young as 6. Persons over 55 years old may have a stronger reaction to this medicine and need smaller doses. Overdosage: If you think you have taken too much of this medicine contact a poison control center or emergency room at once. NOTE: This medicine is only for you. Do not share this medicine with others. What if I miss a dose? If you miss a dose, take it as soon as you can. If it is almost time for your next dose, take only that dose. Do not take double or extra doses. What  may interact with this medicine? -herbal or dietary supplements -local or general anesthetics -medicines for high blood pressure -medicines for prostate problems -rifampin This list may not describe all possible interactions. Give your health care provider a list of all the medicines, herbs, non-prescription drugs, or dietary supplements you use. Also tell them if you smoke, drink alcohol, or use illegal drugs. Some items may interact with your medicine. What should I watch for while using this medicine? Visit your doctor or health care professional for regular check ups. Check your blood pressure and pulse rate regularly. Ask your health care professional what your blood pressure and pulse rate should be, and when you should contact him or her. This medicine may make you feel confused, dizzy or lightheaded. Do not drive, use machinery, or do anything that needs mental alertness until you know how this medicine affects you. To reduce the risk of dizzy or fainting spells, do not sit or stand up quickly, especially if you are an older patient. Avoid alcoholic drinks; they can make you more dizzy. Do not suddenly stop taking amlodipine. Ask your doctor or health care professional how you can gradually reduce the dose. What side effects may I notice from receiving this medicine? Side effects that you should report to your doctor or health care professional as soon as possible: -allergic reactions like skin rash, itching or hives, swelling of the face, lips, or tongue -breathing problems -changes in vision or hearing -chest pain -fast,  irregular heartbeat -swelling of legs or ankles Side effects that usually do not require medical attention (report to your doctor or health care professional if they continue or are bothersome): -dry mouth -facial flushing -nausea, vomiting -stomach gas, pain -tired, weak -trouble sleeping This list may not describe all possible side effects. Call your doctor for  medical advice about side effects. You may report side effects to FDA at 1-800-FDA-1088. Where should I keep my medicine? Keep out of the reach of children. Store at room temperature between 59 and 86 degrees F (15 and 30 degrees C). Protect from light. Keep container tightly closed. Throw away any unused medicine after the expiration date. NOTE: This sheet is a summary. It may not cover all possible information. If you have questions about this medicine, talk to your doctor, pharmacist, or health care provider.  2017 Elsevier/Gold Standard (2011-12-10 11:40:58)

## 2016-02-24 NOTE — Progress Notes (Signed)
   CC: [name] pressure  HPI:  [name] is a 61 y.o. male with a past medical history listed below here today for follow up of his HTN.   For details of today's visit and the status of his chronic medical issues please refer to the assessment and plan.   Past Medical History:  Diagnosis Date  . CVA (cerebral vascular accident) ([place]) 12/2015  . Hypertension     Review of Systems:   See HPI  Physical Exam:  Vitals:   02/24/16 0823  BP: (!) 181/108  Pulse: 70  Temp: 98.1 F (36.7 C)  TempSrc: Oral  SpO2: 100%  Weight: 180 lb 8 oz (81.9 kg)  Height: 6\' 2"  (1.88 m)   Physical Exam  Constitutional: He is well-developed, well-nourished, and in no distress.  HENT:  Head: Normocephalic and atraumatic.  Eyes: Pupils are equal, round, and reactive to light.  Cardiovascular: Normal rate, regular rhythm and normal heart sounds.   Pulmonary/Chest: Effort normal and breath sounds normal. He has no wheezes. He has no rales.  Musculoskeletal: He exhibits no edema.    Assessment & Plan:   See Encounters Tab for problem based charting.  Patient discussed with Dr. Eppie Gibson

## 2016-02-24 NOTE — Assessment & Plan Note (Addendum)
BP Readings from Last 3 Encounters:  02/24/16 (!) 181/108  02/10/16 (!) 197/129  01/09/16 (!) 168/94    Lab Results  Component Value Date   NA 143 02/10/2016   K 3.7 02/10/2016   CREATININE 0.99 02/10/2016   BP today is 181/108. Started on HCTZ 25 mg daily at last visit in addition to his Lisinopril 20 mg daily. Reports compliance and brings his medications to clinic today.  Started checking his BP at home yesterday. Reports it was elevated in the 180s at that time.  Reports compliance with low salt diet.   Denies any headaches, vision changes, lightheaded/dizzy, nausea/vomiting, chest pain. Denies any side effects from the medications.   Assessment: Uncontrolled HTN, improving  Plan: Increase Lisinopril to 40 mg daily.  Add amlodipine 5 mg daily. Continue HCTZ 25 mg daily.  RTC in 2 weeks for BP re-check. Will likely need to increase amlodipine to 10 mg at that time, re-check BMET with increase lisinopril dose.   F/U with me in 1 month.

## 2016-02-24 NOTE — Progress Notes (Signed)
Case discussed with Dr. Boswell at the time of the visit. We reviewed the resident's history and exam and pertinent patient test results. I agree with the assessment, diagnosis, and plan of care documented in the resident's note. 

## 2016-03-08 ENCOUNTER — Other Ambulatory Visit: Payer: Self-pay | Admitting: Internal Medicine

## 2016-03-09 ENCOUNTER — Ambulatory Visit (INDEPENDENT_AMBULATORY_CARE_PROVIDER_SITE_OTHER): Payer: BLUE CROSS/BLUE SHIELD | Admitting: Pulmonary Disease

## 2016-03-09 VITALS — BP 154/90 | HR 69 | Temp 98.2°F | Ht 74.0 in | Wt 182.4 lb

## 2016-03-09 DIAGNOSIS — Z79899 Other long term (current) drug therapy: Secondary | ICD-10-CM | POA: Diagnosis not present

## 2016-03-09 DIAGNOSIS — Z8673 Personal history of transient ischemic attack (TIA), and cerebral infarction without residual deficits: Secondary | ICD-10-CM | POA: Diagnosis not present

## 2016-03-09 DIAGNOSIS — I10 Essential (primary) hypertension: Secondary | ICD-10-CM

## 2016-03-09 DIAGNOSIS — F17211 Nicotine dependence, cigarettes, in remission: Secondary | ICD-10-CM | POA: Diagnosis not present

## 2016-03-09 MED ORDER — LISINOPRIL 40 MG PO TABS: 40.0000 mg | ORAL_TABLET | Freq: Every day | ORAL | 2 refills | Status: DC

## 2016-03-09 NOTE — Progress Notes (Signed)
   CC: HTN follow up  HPI:  [name] is a 61 y.o. man with history of CVA and HTN here for follow up of his HTN.  At visit on 1/30, Lisinopril increased to 40 mg daily. Added amlodipine 5 mg daily. Continued HCTZ 25 mg daily.  Last two days he was only taking 1/2 tablet because the prescription said to do so.   Past Medical History:  Diagnosis Date  . CVA (cerebral vascular accident) ([place]) 12/2015  . Hypertension     Review of Systems:   No fevers or chills No chest pain  Physical Exam:  Vitals:   03/09/16 0818 03/09/16 0930  BP: (!) 154/71 (!) 154/90  Pulse: 69   Temp: 98.2 F (36.8 C)   TempSrc: Oral   SpO2: 100%   Weight: 182 lb 6.4 oz (82.7 kg)   Height: 6\' 2"  (1.88 m)    General Apperance: NAD HEENT: Normocephalic, atraumatic, anicteric sclera Neck: Supple, trachea midline Lungs: Clear to auscultation bilaterally. No wheezes, rhonchi or rales. Breathing comfortably Heart: Regular rate and rhythm, no murmur/rub/gallop Abdomen: Soft, nontender, nondistended, no rebound/guarding Extremities: Warm and well perfused, no edema Skin: No rashes or lesions Neurologic: Alert and interactive. No gross deficits.  Assessment & Plan:   See Encounters Tab for problem based charting.  Patient discussed with Dr. Angelia Mould

## 2016-03-09 NOTE — Patient Instructions (Addendum)
Take one whole tablet of lisinopril daily Take the rest of your medications as instructed Follow up in 4-6 weeks

## 2016-03-09 NOTE — Assessment & Plan Note (Signed)
Assessment: BP 154/90. Patient has been taking 20mg  of lisinopril daily for the last two days instead of 40mg .  Plan: Continue amlodipine 5mg  daily Continue HCTZ 25mg  daily Increase lisinopril (changed prescription sig) to 40mg  daily Obtain BMP Follow up in 4-6 weeks

## 2016-03-09 NOTE — Progress Notes (Signed)
[facility] Medicine Clinic Attending  Case discussed with Dr. Krall at the time of the visit.  We reviewed the resident's history and exam and pertinent patient test results.  I agree with the assessment, diagnosis, and plan of care documented in the resident's note.  

## 2016-03-10 LAB — BMP8+ANION GAP
Anion Gap: 17 mmol/L (ref 10.0–18.0)
BUN/Creatinine Ratio: 11 (ref 10–24)
BUN: 10 mg/dL (ref 8–27)
CHLORIDE: 98 mmol/L (ref 96–106)
CO2: 25 mmol/L (ref 18–29)
Calcium: 9.3 mg/dL (ref 8.6–10.2)
Creatinine, Ser: 0.92 mg/dL (ref 0.76–1.27)
GFR calc non Af Amer: 89 mL/min/{1.73_m2} (ref >59)
GFR, EST AFRICAN AMERICAN: 103 mL/min/{1.73_m2} (ref >59)
GLUCOSE: 127 mg/dL — AB (ref 65–99)
POTASSIUM: 3.9 mmol/L (ref 3.5–5.2)
Sodium: 140 mmol/L (ref 134–144)

## 2016-04-06 ENCOUNTER — Telehealth: Payer: Self-pay | Admitting: Internal Medicine

## 2016-04-06 ENCOUNTER — Ambulatory Visit: Payer: BLUE CROSS/BLUE SHIELD

## 2016-04-06 NOTE — Telephone Encounter (Signed)
APT. REMINDER CALL, LMTCB °

## 2016-04-07 ENCOUNTER — Ambulatory Visit (INDEPENDENT_AMBULATORY_CARE_PROVIDER_SITE_OTHER): Payer: BLUE CROSS/BLUE SHIELD | Admitting: Internal Medicine

## 2016-04-07 ENCOUNTER — Encounter: Payer: Self-pay | Admitting: Internal Medicine

## 2016-04-07 DIAGNOSIS — Z8673 Personal history of transient ischemic attack (TIA), and cerebral infarction without residual deficits: Secondary | ICD-10-CM

## 2016-04-07 DIAGNOSIS — I10 Essential (primary) hypertension: Secondary | ICD-10-CM | POA: Diagnosis not present

## 2016-04-07 DIAGNOSIS — Z79899 Other long term (current) drug therapy: Secondary | ICD-10-CM | POA: Diagnosis not present

## 2016-04-07 DIAGNOSIS — F17211 Nicotine dependence, cigarettes, in remission: Secondary | ICD-10-CM

## 2016-04-07 MED ORDER — AMLODIPINE BESYLATE 10 MG PO TABS: 10.0000 mg | ORAL_TABLET | Freq: Every day | ORAL | 1 refills | Status: DC

## 2016-04-07 NOTE — Assessment & Plan Note (Signed)
Vitals:   04/07/16 0838 04/07/16 0847  BP: (!) 164/101 (!) 152/92  Pulse: 73   Temp: 98.2 F (36.8 C)    BP still remains above goal. Will increase amlodipine to 10mg  daily, cont lisinopril 40mg  daily and hctz 25mg  daily. Discussed dietary compliance with a healthy diet with low sodium. F/up in 4-6 weeks.

## 2016-04-07 NOTE — Progress Notes (Signed)
Case discussed with Dr. Ahmed at the time of the visit. We reviewed the resident's history and exam and pertinent patient test results. I agree with the assessment, diagnosis, and plan of care documented in the resident's note. 

## 2016-04-07 NOTE — Patient Instructions (Signed)
Please take your blood pressure medications. We increased amlodipine to 10mg  daily.  Follow up in 4-6 weeks.    DASH Eating Plan DASH stands for "Dietary Approaches to Stop Hypertension." The DASH eating plan is a healthy eating plan that has been shown to reduce high blood pressure (hypertension). It may also reduce your risk for type 2 diabetes, heart disease, and stroke. The DASH eating plan may also help with weight loss. What are tips for following this plan? General guidelines   Avoid eating more than 2,300 mg (milligrams) of salt (sodium) a day. If you have hypertension, you may need to reduce your sodium intake to 1,500 mg a day.  Limit alcohol intake to no more than 1 drink a day for nonpregnant women and 2 drinks a day for men. One drink equals 12 oz of beer, 5 oz of wine, or 1 oz of hard liquor.  Work with your health care provider to maintain a healthy body weight or to lose weight. Ask what an ideal weight is for you.  Get at least 30 minutes of exercise that causes your heart to beat faster (aerobic exercise) most days of the week. Activities may include walking, swimming, or biking.  Work with your health care provider or diet and nutrition specialist (dietitian) to adjust your eating plan to your individual calorie needs. Reading food labels   Check food labels for the amount of sodium per serving. Choose foods with less than 5 percent of the Daily Value of sodium. Generally, foods with less than 300 mg of sodium per serving fit into this eating plan.  To find whole grains, look for the word "whole" as the first word in the ingredient list. Shopping   Buy products labeled as "low-sodium" or "no salt added."  Buy fresh foods. Avoid canned foods and premade or frozen meals. Cooking   Avoid adding salt when cooking. Use salt-free seasonings or herbs instead of table salt or sea salt. Check with your health care provider or pharmacist before using salt substitutes.  Do  not fry foods. Cook foods using healthy methods such as baking, boiling, grilling, and broiling instead.  Cook with heart-healthy oils, such as olive, canola, soybean, or sunflower oil. Meal planning    Eat a balanced diet that includes:  5 or more servings of fruits and vegetables each day. At each meal, try to fill half of your plate with fruits and vegetables.  Up to 6-8 servings of whole grains each day.  Less than 6 oz of lean meat, poultry, or fish each day. A 3-oz serving of meat is about the same size as a deck of cards. One egg equals 1 oz.  2 servings of low-fat dairy each day.  A serving of nuts, seeds, or beans 5 times each week.  Heart-healthy fats. Healthy fats called Omega-3 fatty acids are found in foods such as flaxseeds and coldwater fish, like sardines, salmon, and mackerel.  Limit how much you eat of the following:  Canned or prepackaged foods.  Food that is high in trans fat, such as fried foods.  Food that is high in saturated fat, such as fatty meat.  Sweets, desserts, sugary drinks, and other foods with added sugar.  Full-fat dairy products.  Do not salt foods before eating.  Try to eat at least 2 vegetarian meals each week.  Eat more home-cooked food and less restaurant, buffet, and fast food.  When eating at a restaurant, ask that your food be prepared with  less salt or no salt, if possible. What foods are recommended? The items listed may not be a complete list. Talk with your dietitian about what dietary choices are best for you. Grains  Whole-grain or whole-wheat bread. Whole-grain or whole-wheat pasta. Brown rice. [place]. Bulgur. Whole-grain and low-sodium cereals. Pita bread. Low-fat, low-sodium crackers. Whole-wheat flour tortillas. Vegetables  Fresh or frozen vegetables (raw, steamed, roasted, or grilled). Low-sodium or reduced-sodium tomato and vegetable juice. Low-sodium or reduced-sodium tomato sauce and tomato paste. Low-sodium  or reduced-sodium canned vegetables. Fruits  All fresh, dried, or frozen fruit. Canned fruit in natural juice (without added sugar). Meat and other protein foods  Skinless chicken or [place]. Ground chicken or [place]. Pork with fat trimmed off. Fish and seafood. Egg whites. Dried beans, peas, or lentils. Unsalted nuts, nut butters, and seeds. Unsalted canned beans. Lean cuts of beef with fat trimmed off. Low-sodium, lean deli meat. Dairy  Low-fat (1%) or fat-free (skim) milk. Fat-free, low-fat, or reduced-fat cheeses. Nonfat, low-sodium ricotta or cottage cheese. Low-fat or nonfat yogurt. Low-fat, low-sodium cheese. Fats and oils  Soft margarine without trans fats. Vegetable oil. Low-fat, reduced-fat, or light mayonnaise and salad dressings (reduced-sodium). Canola, safflower, olive, soybean, and sunflower oils. Avocado. Seasoning and other foods  Herbs. Spices. Seasoning mixes without salt. Unsalted popcorn and pretzels. Fat-free sweets. What foods are not recommended? The items listed may not be a complete list. Talk with your dietitian about what dietary choices are best for you. Grains  Baked goods made with fat, such as croissants, muffins, or some breads. Dry pasta or rice meal packs. Vegetables  Creamed or fried vegetables. Vegetables in a cheese sauce. Regular canned vegetables (not low-sodium or reduced-sodium). Regular canned tomato sauce and paste (not low-sodium or reduced-sodium). Regular tomato and vegetable juice (not low-sodium or reduced-sodium). Angie Fava. Olives. Fruits  Canned fruit in a light or heavy syrup. Fried fruit. Fruit in cream or butter sauce. Meat and other protein foods  Fatty cuts of meat. Ribs. Fried meat. Berniece Salines. Sausage. Bologna and other processed lunch meats. Salami. Fatback. Hotdogs. Bratwurst. Salted nuts and seeds. Canned beans with added salt. Canned or smoked fish. Whole eggs or egg yolks. Chicken or [place] with skin. Dairy  Whole or 2% milk, cream, and  half-and-half. Whole or full-fat cream cheese. Whole-fat or sweetened yogurt. Full-fat cheese. Nondairy creamers. Whipped toppings. Processed cheese and cheese spreads. Fats and oils  Butter. Stick margarine. Lard. Shortening. Ghee. Bacon fat. Tropical oils, such as coconut, palm kernel, or palm oil. Seasoning and other foods  Salted popcorn and pretzels. Onion salt, garlic salt, seasoned salt, table salt, and sea salt. Worcestershire sauce. Tartar sauce. Barbecue sauce. Teriyaki sauce. Soy sauce, including reduced-sodium. Steak sauce. Canned and packaged gravies. Fish sauce. Oyster sauce. Cocktail sauce. Horseradish that you find on the shelf. Ketchup. Mustard. Meat flavorings and tenderizers. Bouillon cubes. Hot sauce and Tabasco sauce. Premade or packaged marinades. Premade or packaged taco seasonings. Relishes. Regular salad dressings. Where to find more information:  National Heart, Lung, and [place]: https://wilson-eaton.com/  American Heart Association: www.heart.org Summary  The DASH eating plan is a healthy eating plan that has been shown to reduce high blood pressure (hypertension). It may also reduce your risk for type 2 diabetes, heart disease, and stroke.  With the DASH eating plan, you should limit salt (sodium) intake to 2,300 mg a day. If you have hypertension, you may need to reduce your sodium intake to 1,500 mg a day.  When on the DASH  eating plan, aim to eat more fresh fruits and vegetables, whole grains, lean proteins, low-fat dairy, and heart-healthy fats.  Work with your health care provider or diet and nutrition specialist (dietitian) to adjust your eating plan to your individual calorie needs. This information is not intended to replace advice given to you by your health care provider. Make sure you discuss any questions you have with your health care provider. Document Released: 12/31/2010 Document Revised: 01/05/2016 Document Reviewed: 01/05/2016 Elsevier Interactive  Patient Education  2017 [employer].

## 2016-04-07 NOTE — Progress Notes (Signed)
   CC: BP f/up  HPI:  [name] is a 61 y.o. with PMH as listed below is here for HTN f/up  Past Medical History:  Diagnosis Date  . CVA (cerebral vascular accident) ([place]) 12/2015  . Hypertension    HTN: on amlodipine 5 mg daily, hctz 25mg  daily, lisinopril 40mg  daily. BP 152/92. Compliant with meds, no side effects. Eating out throughout the day at fast food restaurants but doesn't add any extra salt to his food.    Review of Systems:   Review of Systems  Constitutional: Negative for chills and fever.  Eyes: Negative for blurred vision.  Cardiovascular: Negative for chest pain and palpitations.  Gastrointestinal: Negative for heartburn, nausea and vomiting.  Genitourinary: Negative for dysuria.  Neurological: Negative for dizziness, tingling, tremors, sensory change and headaches.     Physical Exam:  Vitals:   04/07/16 0838 04/07/16 0847  BP: (!) 164/101 (!) 152/92  Pulse: 73   Temp: 98.2 F (36.8 C)   TempSrc: Oral   SpO2: 100%   Weight: 186 lb 14.4 oz (84.8 kg)   Height: 6\' 1"  (1.854 m)    Physical Exam  Constitutional: He is oriented to person, place, and time. He appears well-developed and well-nourished. No distress.  HENT:  Head: Normocephalic and atraumatic.  Cardiovascular: Normal rate and regular rhythm.  Exam reveals no gallop and no friction rub.   No murmur heard. Respiratory: Effort normal and breath sounds normal. No respiratory distress. He has no wheezes.  Musculoskeletal: Normal range of motion. He exhibits no edema or deformity.  Neurological: He is alert and oriented to person, place, and time.  Skin: He is not diaphoretic.  Psychiatric: He has a normal mood and affect.    Assessment & Plan:   See Encounters Tab for problem based charting.  Patient discussed with Dr. Eppie Gibson

## 2016-04-19 ENCOUNTER — Other Ambulatory Visit: Payer: Self-pay | Admitting: Internal Medicine

## 2016-05-06 ENCOUNTER — Other Ambulatory Visit: Payer: Self-pay | Admitting: *Deleted

## 2016-05-06 DIAGNOSIS — I10 Essential (primary) hypertension: Secondary | ICD-10-CM

## 2016-05-07 MED ORDER — AMLODIPINE BESYLATE 10 MG PO TABS: 10.0000 mg | ORAL_TABLET | Freq: Every day | ORAL | 0 refills | Status: DC

## 2016-05-07 NOTE — Telephone Encounter (Signed)
Refilled 90 day supply

## 2016-05-20 ENCOUNTER — Telehealth: Payer: Self-pay | Admitting: Internal Medicine

## 2016-05-20 NOTE — Telephone Encounter (Signed)
APT. REMINDER CALL, LMTCB °

## 2016-05-21 ENCOUNTER — Encounter (INDEPENDENT_AMBULATORY_CARE_PROVIDER_SITE_OTHER): Payer: Self-pay

## 2016-05-21 ENCOUNTER — Ambulatory Visit (INDEPENDENT_AMBULATORY_CARE_PROVIDER_SITE_OTHER): Payer: BLUE CROSS/BLUE SHIELD | Admitting: Internal Medicine

## 2016-05-21 VITALS — BP 136/85 | HR 81 | Temp 98.7°F | Ht 73.0 in | Wt 185.3 lb

## 2016-05-21 DIAGNOSIS — Z7289 Other problems related to lifestyle: Secondary | ICD-10-CM

## 2016-05-21 DIAGNOSIS — Z79899 Other long term (current) drug therapy: Secondary | ICD-10-CM | POA: Diagnosis not present

## 2016-05-21 DIAGNOSIS — F17211 Nicotine dependence, cigarettes, in remission: Secondary | ICD-10-CM | POA: Diagnosis not present

## 2016-05-21 DIAGNOSIS — Z7982 Long term (current) use of aspirin: Secondary | ICD-10-CM | POA: Diagnosis not present

## 2016-05-21 DIAGNOSIS — I10 Essential (primary) hypertension: Secondary | ICD-10-CM | POA: Diagnosis not present

## 2016-05-21 DIAGNOSIS — Z8673 Personal history of transient ischemic attack (TIA), and cerebral infarction without residual deficits: Secondary | ICD-10-CM

## 2016-05-21 DIAGNOSIS — R739 Hyperglycemia, unspecified: Secondary | ICD-10-CM

## 2016-05-21 DIAGNOSIS — Z1211 Encounter for screening for malignant neoplasm of colon: Secondary | ICD-10-CM

## 2016-05-21 DIAGNOSIS — Z23 Encounter for immunization: Secondary | ICD-10-CM

## 2016-05-21 DIAGNOSIS — Z720 Tobacco use: Secondary | ICD-10-CM

## 2016-05-21 DIAGNOSIS — E119 Type 2 diabetes mellitus without complications: Secondary | ICD-10-CM | POA: Insufficient documentation

## 2016-05-21 LAB — POCT GLYCOSYLATED HEMOGLOBIN (HGB A1C): Hemoglobin A1C: 8.3

## 2016-05-21 LAB — GLUCOSE, CAPILLARY: GLUCOSE-CAPILLARY: 153 mg/dL — AB (ref 65–99)

## 2016-05-21 NOTE — Assessment & Plan Note (Signed)
He was given Tdap.today.  He was given a referral for colonoscopy. We tested him for hep C and HIV.

## 2016-05-21 NOTE — Assessment & Plan Note (Signed)
No residual deficit.  No current complaints.  Continue risk modification with aspirin and Lipitor.

## 2016-05-21 NOTE — Progress Notes (Signed)
   CC: For follow-up of his [name] pressure.  HPI:  [name] is a 61 y.o. with past medical history as listed below came to the clinic for follow-up of his [name] pressure. His amlodipine was increased to 10 mg daily during his previous office visit.  He has no other complaint.  Past Medical History:  Diagnosis Date  . CVA (cerebral vascular accident) ([place]) 12/2015  . Hypertension     Review of Systems:  As per HPI  Physical Exam:  Vitals:   05/21/16 1616  BP: 136/85  Pulse: 81  Temp: 98.7 F (37.1 C)  TempSrc: Oral  SpO2: 100%  Weight: 185 lb 4.8 oz (84.1 kg)  Height: 6\' 1"  (1.854 m)   General: Vital signs reviewed.  Patient is well-developed and well-nourished, in no acute distress and cooperative with exam.  Head: Normocephalic and atraumatic. Eyes: EOMI, conjunctivae normal, no scleral icterus.  Neck: Supple, trachea midline, normal ROM, no JVD, masses, thyromegaly, or carotid bruit present.  Cardiovascular: RRR, S1 normal, S2 normal, no murmurs, gallops, or rubs. Pulmonary/Chest: Clear to auscultation bilaterally, no wheezes, rales, or rhonchi. Abdominal: Soft, non-tender, non-distended, BS +, no masses, organomegaly, or guarding present.  Musculoskeletal: No joint deformities, erythema, or stiffness, ROM full and nontender. Extremities: No lower extremity edema bilaterally,  pulses symmetric and intact bilaterally. No cyanosis or clubbing. Neurological: A&O x3, Strength is normal and symmetric bilaterally, cranial nerve II-XII are grossly intact, no focal motor deficit, sensory intact to light touch bilaterally.  Skin: Warm, dry and intact. No rashes or erythema. Psychiatric: Normal mood and affect. speech and behavior is normal. Cognition and memory are normal. Assessment & Plan:   See Encounters Tab for problem based charting.  Patient discussed with Dr. Lynnae January.

## 2016-05-21 NOTE — Assessment & Plan Note (Signed)
BP Readings from Last 3 Encounters:  05/21/16 136/85  04/07/16 (!) 152/92  03/09/16 (!) 154/90    Lab Results  Component Value Date   NA 140 03/09/2016   K 3.9 03/09/2016   CREATININE 0.92 03/09/2016    Assessment: Blood pressure control:  improving Progress toward BP goal:   Good Comments: The patient's blood pressure improved with increase in amlodipine, he is compliant with his medications.  Plan: Medications:  continue current medications Educational resources provided:  yes Self management tools provided:   Other plans: We talked about low sodium diet and regular exercise again today. We will monitor his blood pressure. His goal blood pressure is below 130/80.

## 2016-05-21 NOTE — Patient Instructions (Addendum)
Thank you for visiting clinic today. Your blood sugar and hemoglobin A1c both are high today. Please watch for your diet as we discussed today. Please come back in 2 weeks' time to see our diabetic coordinator Butch Penny, we will check your A1c again that day to confirm today's value, if it remained high we will start you on a new medicine to control your blood sugar.

## 2016-05-21 NOTE — Assessment & Plan Note (Signed)
He quit smoking in December 2017.

## 2016-05-21 NOTE — Progress Notes (Signed)
Internal Medicine Clinic Attending  Case discussed with Dr. Amin at the time of the visit.  We reviewed the resident's history and exam and pertinent patient test results.  I agree with the assessment, diagnosis, and plan of care documented in the resident's note.    

## 2016-05-21 NOTE — Assessment & Plan Note (Addendum)
He was found to have an A1c of 6.2 during his hospital visit in December 2017 for TIA. He is not a known diabetic.  We repeated his A1c today and it was 8.3. With CBG of 153.  Patient drinks a lot of regular soda and juices. According to him he was never told that he should watch for his sugar and carbohydrate intake. He do give some history of polyuria but stating that it's not new and he attributes this with his soda intake, as on days he drinks more soda he has to urinate more and this is not a new thing for him. He denies any nocturia and blurry vision.  I encouraged him to do lifestyle modifications with watch for diet, replace soda with water as he do not like diet soda. Exercise regularly and eat low carbohydrate diet-I provided him with some literature.  We made an appointment to see Butch Penny in 2 weeks. We will repeat his A1c during that visit. If it remains elevated-he will be started on metformin 500 mg daily.  He will need a close follow-up than to titrate his dose.

## 2016-05-22 LAB — HIV ANTIBODY (ROUTINE TESTING W REFLEX): HIV Screen 4th Generation wRfx: NONREACTIVE

## 2016-05-22 LAB — HEPATITIS C ANTIBODY: Hep C Virus Ab: <0.1 s/co ratio (ref 0.0–0.9)

## 2016-05-31 ENCOUNTER — Ambulatory Visit (INDEPENDENT_AMBULATORY_CARE_PROVIDER_SITE_OTHER): Payer: BLUE CROSS/BLUE SHIELD | Admitting: Dietician

## 2016-05-31 ENCOUNTER — Encounter: Payer: Self-pay | Admitting: *Deleted

## 2016-05-31 ENCOUNTER — Other Ambulatory Visit: Payer: Self-pay | Admitting: Dietician

## 2016-05-31 ENCOUNTER — Other Ambulatory Visit: Payer: Self-pay | Admitting: Internal Medicine

## 2016-05-31 DIAGNOSIS — E119 Type 2 diabetes mellitus without complications: Secondary | ICD-10-CM | POA: Diagnosis not present

## 2016-05-31 DIAGNOSIS — Z713 Dietary counseling and surveillance: Secondary | ICD-10-CM | POA: Diagnosis not present

## 2016-05-31 DIAGNOSIS — R739 Hyperglycemia, unspecified: Secondary | ICD-10-CM

## 2016-05-31 DIAGNOSIS — E1165 Type 2 diabetes mellitus with hyperglycemia: Secondary | ICD-10-CM | POA: Diagnosis not present

## 2016-05-31 DIAGNOSIS — Z87891 Personal history of nicotine dependence: Secondary | ICD-10-CM

## 2016-05-31 DIAGNOSIS — Z6824 Body mass index (BMI) 24.0-24.9, adult: Secondary | ICD-10-CM | POA: Diagnosis not present

## 2016-05-31 LAB — GLUCOSE, CAPILLARY: GLUCOSE-CAPILLARY: 150 mg/dL — AB (ref 65–99)

## 2016-05-31 LAB — POCT GLYCOSYLATED HEMOGLOBIN (HGB A1C): HEMOGLOBIN A1C: 8.3

## 2016-05-31 MED ORDER — METFORMIN HCL 500 MG PO TABS: 500.0000 mg | ORAL_TABLET | Freq: Two times a day (BID) | ORAL | 11 refills | Status: DC

## 2016-05-31 NOTE — Patient Instructions (Addendum)
1- Suggest NO trans fats 2- Less than 15 gram saturated fats per day    (replace saturated with mono and polyunsaturated) 3- Fish at least 2-3 times a week 4- Aim for more than 30 grams fiber/day from beans, fruits, vegetables, whole grains and nuts.  Eat more... 1. Whole grains: whole wheat cereal, crackers, bread, pasta, old fashioned oats & brown rice 2. Whole fruits-1 cup a day 3. Vegetables- 2-3 cups a day 4. Lowfat Protein: Chicken, [place], lean cuts of beef and pork, fish 2-3 times a week 5. Nuts, Seeds and Soy:add walnuts to cereal, peanut butter sandwich  Eat Less... 1. Saturated & Transfats- mostly from red meat, high fat dairy like milk, ice cream, coffee creamer, snack foods like chips, pork rind, fried foods 2. Added Sugar: artifical sweeteners can help 3. Sodium:Use spices instead of salt, avoid salty foods like soup, crackers, chips, lunch meat, sausage, hot dogs  Avoid.... . High-fructose corn syrup and "other sugars" . Partially hydrogenated vegetable oils . Trans fatty acids . Nondairy coffee creamer Of course, too much of almost any food.   The metformin prescription will be sent to your pharmacy.

## 2016-05-31 NOTE — Progress Notes (Signed)
  Medical Nutrition Therapy:  Appt start time: 0830 end time:  0915 Visit # 1  Assessment:  Primary concerns today: Seth [name]/ new diabetes.   Seth Seth [name] reports already working on drinking diet soda and seltzer water instead of regular soda to lower his Seth [name]. He is also using beet powder daily and smoothies to be sure he gets his fruit and vegetables,. 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 He enjoys being in the sun and sweating. He reports cotton mouth which he thinks is a side effect of his other medications. He agrees to start metformin today and was relieved not to have to test his Seth [name] at home. He miantains his weight in a healthy range and quit smoking a few year ago.  Preferred Learning Style: Auditory,Visual Learning Readiness:Ready & Change in progress  ANTHROPOMETRICS: weight-185#, height-73", BMI-24.5, IBW-165-185# WEIGHT HISTORY: Highest: 187# this year,  Lowest-168# in 2017 SLEEP:need to assess at future visit  Denies- Nausea,Vomiting:,Diarrhea ,Constipation, hair loss:   MEDICATIONS: norvasc, lisinopril, ASA, HCTZ, Lipitor Seth [name]:glucose was 153 and A1C was 8.3 10 days ago, glucose was 150 today with A1C 8.3%  DIETARY INTAKE: Usual eating pattern includes 3 meals and 0 snacks per day. Just started eating yogurt and doing fruit & vegetable smoothies.  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   Nutritional Diagnosis:  NB-1.1 Food and nutrition-related knowledge deficit As related to newly diagnosed diabetes.  As evidenced by his questions  and concerns.    Intervention:  Nutrition education about healthy diabetes meal planning and helped him understand how to the body controls Seth [name]. Coordination of care: requested prescription per Seth Seth [name] for metformin  Teaching Method Utilized: Visual, Auditory  Handouts given during visit include: ADA's where do I begin? Booklet and A!C handout Barriers to learning/adherence to lifestyle change: competing values Demonstrated degree of understanding via:  Teach Back   Monitoring/Evaluation:  Dietary intake, exercise, and body weight in 4 week(s). Seth Seth [name], [place] 05/31/2016 9:39 AM.

## 2016-05-31 NOTE — Progress Notes (Signed)
Per your request, instructed patient to begin metformin because repeat A1C was 8.3%. He verbalized understanding to the instructions that were provided to him in writing.

## 2016-05-31 NOTE — Progress Notes (Signed)
I did send an order to his pharmacy. Thank you for your help.  Carma Dwiggins

## 2016-06-10 ENCOUNTER — Telehealth: Payer: Self-pay | Admitting: Dietician

## 2016-06-10 NOTE — Telephone Encounter (Signed)
Left a message for patient to clarify his metformin dose. Patient will need additional medication. Request corrected amount to dispense and instructions be sent to pharmacy.

## 2016-06-10 NOTE — Telephone Encounter (Signed)
Ok Thanks.  Seth [name]

## 2016-06-11 MED ORDER — METFORMIN HCL 500 MG PO TABS: ORAL_TABLET | ORAL | 11 refills | Status: DC

## 2016-06-11 NOTE — Telephone Encounter (Signed)
Signed new Rx to go electronically.

## 2016-06-11 NOTE — Telephone Encounter (Signed)
Mr Seth [name] calls saying he has not gotten a prescription for metformin yet. Upon chart review, the prescription was set to print rather than  Normal so it never went to his pharmacy.  Will ask Attending to expedite it.

## 2016-07-01 ENCOUNTER — Ambulatory Visit (INDEPENDENT_AMBULATORY_CARE_PROVIDER_SITE_OTHER): Payer: BLUE CROSS/BLUE SHIELD | Admitting: Dietician

## 2016-07-01 DIAGNOSIS — Z6823 Body mass index (BMI) 23.0-23.9, adult: Secondary | ICD-10-CM | POA: Diagnosis not present

## 2016-07-01 DIAGNOSIS — E119 Type 2 diabetes mellitus without complications: Secondary | ICD-10-CM | POA: Diagnosis not present

## 2016-07-01 DIAGNOSIS — Z7984 Long term (current) use of oral hypoglycemic drugs: Secondary | ICD-10-CM

## 2016-07-01 DIAGNOSIS — Z713 Dietary counseling and surveillance: Secondary | ICD-10-CM

## 2016-07-01 LAB — GLUCOSE, CAPILLARY: GLUCOSE-CAPILLARY: 83 mg/dL (ref 65–99)

## 2016-07-01 NOTE — Progress Notes (Signed)
  Medical Nutrition Therapy:  Appt start time: 0816 end time:  0915 Visit # 2  Assessment:  Primary concerns today: blood sugar/ new diabetes.   [name] reports  Cutting back on snacks except for fruits, he is reading labels trying to keep sugar and carbs to less than 20 g/servings.  He is tolerating 1500 mg metformin/day, He lost 9# since last visit and miantains his weight in a healthy range.  Learning Readiness:Ready & Change in progress  ANTHROPOMETRICS: weight-175.9#, height-73", BMI-23.21, IBW-165-185# SLEEP:need to assess   MEDICATIONS: norvasc, lisinopril, ASA, HCTZ, Lipitor, metformin 1500 mg/day BLOOD SUGAR:glucose was 83 DIETARY INTAKE: Usual eating pattern includes 3 meals and 0 snacks per day. Just started eating yogurt and doing fruit & vegetable smoothies.  Dining Out (times/week): while working - [employer]. 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   Nutritional Diagnosis:  NB-1.1 Food and nutrition-related knowledge deficit As related to newly diagnosed diabetes improved  As evidenced by his questions, blood sugar and weight decreases.     Intervention:  Nutrition education about how to care fo rhis diabetes, answered his questions about healthy diabetes meal planning and helped him understand the difference between Type 1 and Type 2 Coordination of care: none  Teaching Method Utilized: Visual, Auditory  Handouts given during visit include: AVS, caing for my diabetes pocket card with results. Barriers to learning/adherence to lifestyle change: competing values Demonstrated degree of understanding via:  Teach Back   Monitoring/Evaluation:  Dietary intake, exercise, and body weight in 8 week(s). Cadence Haslam, Butch Penny,  [facility] 07/01/2016 9:24 AM.

## 2016-07-01 NOTE — Addendum Note (Signed)
Addended by: Yvonna Alanis on: 07/01/2016 01:19 PM   Modules accepted: Orders

## 2016-07-01 NOTE — Patient Instructions (Addendum)
Please make an appointment to see Dr. Reesa Chew in late July/August.  Your blood sugar was 83 mg/dl.  Your blood pressure 152/80,  147/84  You are doing a good job taking care of your diabetes.   Continue doing as you are!

## 2016-07-12 ENCOUNTER — Telehealth: Payer: Self-pay | Admitting: Dietician

## 2016-07-12 NOTE — Telephone Encounter (Signed)
Left patient a message to go back to the dose of metformin that he was tolerating: for example if 2000 mg/day( 2pills twice a day) made him feel bad, go back to 1500 mg/day( 2 pills in Pm and 1 Pill in am) .

## 2016-07-13 NOTE — Telephone Encounter (Addendum)
Patient returned call saying that his vomit over the weekend had a tiny bit of pink [name] in it. He is fine now and back on 1500 mg metformin for past 48 hours. He complains of bitter taste from metformin. Asked him to call if any further nausea/vomiting/[name] noticed. [name] verbalized understanding

## 2016-09-03 ENCOUNTER — Encounter: Payer: Self-pay | Admitting: Dietician

## 2016-09-03 ENCOUNTER — Ambulatory Visit (INDEPENDENT_AMBULATORY_CARE_PROVIDER_SITE_OTHER): Payer: BLUE CROSS/BLUE SHIELD | Admitting: Dietician

## 2016-09-03 ENCOUNTER — Ambulatory Visit (INDEPENDENT_AMBULATORY_CARE_PROVIDER_SITE_OTHER): Payer: BLUE CROSS/BLUE SHIELD | Admitting: Internal Medicine

## 2016-09-03 VITALS — BP 136/96 | HR 81 | Temp 98.6°F | Wt 171.1 lb

## 2016-09-03 DIAGNOSIS — Z7984 Long term (current) use of oral hypoglycemic drugs: Secondary | ICD-10-CM

## 2016-09-03 DIAGNOSIS — Z713 Dietary counseling and surveillance: Secondary | ICD-10-CM

## 2016-09-03 DIAGNOSIS — F17211 Nicotine dependence, cigarettes, in remission: Secondary | ICD-10-CM | POA: Diagnosis not present

## 2016-09-03 DIAGNOSIS — K4090 Unilateral inguinal hernia, without obstruction or gangrene, not specified as recurrent: Secondary | ICD-10-CM | POA: Diagnosis not present

## 2016-09-03 DIAGNOSIS — E119 Type 2 diabetes mellitus without complications: Secondary | ICD-10-CM

## 2016-09-03 DIAGNOSIS — J301 Allergic rhinitis due to pollen: Secondary | ICD-10-CM

## 2016-09-03 DIAGNOSIS — Z79899 Other long term (current) drug therapy: Secondary | ICD-10-CM

## 2016-09-03 DIAGNOSIS — R739 Hyperglycemia, unspecified: Secondary | ICD-10-CM

## 2016-09-03 DIAGNOSIS — Z9109 Other allergy status, other than to drugs and biological substances: Secondary | ICD-10-CM

## 2016-09-03 DIAGNOSIS — I10 Essential (primary) hypertension: Secondary | ICD-10-CM

## 2016-09-03 LAB — POCT GLYCOSYLATED HEMOGLOBIN (HGB A1C): Hemoglobin A1C: 6.8

## 2016-09-03 LAB — GLUCOSE, CAPILLARY: Glucose-Capillary: 110 mg/dL — ABNORMAL HIGH (ref 65–99)

## 2016-09-03 MED ORDER — HYDROCHLOROTHIAZIDE 25 MG PO TABS: 25.0000 mg | ORAL_TABLET | Freq: Every day | ORAL | 2 refills | Status: DC

## 2016-09-03 MED ORDER — ATORVASTATIN CALCIUM 80 MG PO TABS: 80.0000 mg | ORAL_TABLET | Freq: Every day | ORAL | 2 refills | Status: DC

## 2016-09-03 MED ORDER — LISINOPRIL 40 MG PO TABS: 40.0000 mg | ORAL_TABLET | Freq: Every day | ORAL | 2 refills | Status: DC

## 2016-09-03 MED ORDER — AMLODIPINE BESYLATE 10 MG PO TABS: 10.0000 mg | ORAL_TABLET | Freq: Every day | ORAL | 2 refills | Status: DC

## 2016-09-03 MED ORDER — SITAGLIPTIN PHOSPHATE 50 MG PO TABS: 50.0000 mg | ORAL_TABLET | Freq: Every day | ORAL | 2 refills | Status: DC

## 2016-09-03 MED ORDER — OLOPATADINE HCL 0.7 % OP SOLN: 1.0000 [drp] | Freq: Every day | OPHTHALMIC | 2 refills | Status: DC

## 2016-09-03 NOTE — Progress Notes (Signed)
  Medical Nutrition Therapy:  Appt start time: 1400 end time:  8264 Visit # 3  Assessment:  Primary concerns today: Seth [name]/ new diabetes.   Seth Seth [name] reports  Cutting back on snacks except for fruits, he is reading labels trying to keep sugar and carbs to less than 20 g/servings. He lost a few more pounds and is in the middle of his healthy weight range.   Learning Readiness:Ready & Change in progress  ANTHROPOMETRICS: weight-171.1#, height-73", BMI-22.57, IBW-165-185# SLEEP: very good per patient  Most of the time MEDICATIONS: norvasc, lisinopril, ASA, HCTZ, Lipitor, stopped metformin due to side affects. Starting [place] today Seth [name]:glucose was 110 today DIETARY INTAKE: Not gone over in detail today. He reports drinking large amounts of juice almost daily and soda periodically also. We discussed the servings sizes, sugar content of both and how to read labels as well as options to work them in without increasing Seth [name].   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   Nutritional Diagnosis:  NB-1.1 Food and nutrition-related knowledge deficit As related to newly diagnosed diabetes improved  As evidenced by his questions, Seth [name] and weight decreases.     Intervention:  Nutrition education about how to care for his diabetes, answered his questions about healthy diabetes meal planning and helped him understand the freestyle Seth Seth [name] personal and purpose of self monitoring. Coordination of care: none  Teaching Method Utilized: Visual, Auditory  Handouts given during visit include: AVS, caing for my diabetes pocket card with results. Barriers to learning/adherence to lifestyle change: competing values Demonstrated degree of understanding via:  Teach Back   Monitoring/Evaluation:  Dietary intake, exercise, and body weight in 8 week(s). Seth Seth [name], Seth Seth [name], [place] 09/03/2016 1:55 PM.

## 2016-09-03 NOTE — Assessment & Plan Note (Signed)
His right inguinal intermittent swelling is most likely due to right inguinal hernia.  He has a mild disease, hernia is not persistent and has no sign of obstruction. It does not interfere with his daily activities nor it cause any pain. I offered him a surgical evaluation, he declined at this time, stating that if it starts bothering him he will think about it.

## 2016-09-03 NOTE — Patient Instructions (Addendum)
Thank you for visiting clinic today. As we discussed I am changing your diabetes medication. I am stopping metformin and starting you on Januvia, you will take 1 pill every day. At this time your diabetes is well controlled, you had dropped your A1c from 8.3-6.8 today. I'm also giving you an eyedrop for your ichy and red eyes, you can use it once daily. It might make your eyes feel little dry, you can use teardrops for Your dryness. Please take your lisinopril and hydrochlorothiazide in the morning. You can take amlodipine in the evening, if you want to cure her blood pressure medications. Please follow-up in 3 month.

## 2016-09-03 NOTE — Assessment & Plan Note (Signed)
The redness and itchiness of his eyes is most likely because of allergies. Can be pole and" grass, as it get worse after cutting grass.  -He was given a prescription of [place] eyedrops. -He was instructed to use artificial tears if he experiences excessive dryness with this medicine.

## 2016-09-03 NOTE — Assessment & Plan Note (Signed)
He has a new diagnosis of diabetes, when he was found to have A1c of 8.3 in April 2018. He was started on metformin 500 mg initially, gradually increased to 1000 mg twice daily. According to patient since he started increasing his dose he was becoming more  nauseated, altered taste and lost his appetite causing reduction in his weight. He decreased his dose but continued to get similar symptoms. He stopped taking metformin 1 week ago, which resolved all his symptoms and his appetite normalized. He do not want to take metformin anymore.  He did lost 14 pound since April 2018. His A1c today was 6.8. Responded very well to metformin.  -I start him on Januvia 50 mg daily. -Advised him to exercise regularly and try to lose some more weight as it will help with better control of diabetes.

## 2016-09-03 NOTE — Patient Instructions (Signed)
You are doing a good job caring for your diabetes.   When you get your eye exam, ask them to fax [place] the results.  Consider adding water to your juice.   Follow up in 3 months.

## 2016-09-03 NOTE — Assessment & Plan Note (Signed)
BP Readings from Last 3 Encounters:  09/03/16 (!) 136/96  07/01/16 (!) 147/84  05/21/16 136/85   His blood pressure was mildly elevated. He normally takes all his medications at bedtime, including hydrochlorothiazide. On explaining that it is a diuretic and should be taken in the morning, he replied that he was wondering why he has to get up frequently at night for urination. I advised him to take lisinopril and HCTZ in the morning and he can take amlodipine at bedtime as he wants to space out his medications.  We will continue with current regimen of amlodipine 10 mg daily, lisinopril 40 mg daily and HCTZ 25 mg daily. Refills were provided.

## 2016-09-03 NOTE — Progress Notes (Signed)
   CC: For follow-up of his diabetes and hypertension.  HPI:  [name] is a 61 y.o. gentleman with past medical history as listed below came to the clinic for follow-up of his diabetes and hypertension. He was complaining of intermittent right inguinal swelling, increased with exertion or heavy lifting and regress spontaneously with a resting. He noticed this for about 2-3 weeks. He denies any pain.  He was also complaining of itchiness and redness of his both eyes, attributes this to seasonal allergies, stating that he might be allergic to grass as it get worse whenever he cut his grass. He denies any increased rectal lacrimation. He denies any change in his vision or any floaters.  He has no other complaints. Please see assessment and plan part for his chronic  Problems.  Past Medical History:  Diagnosis Date  . CVA (cerebral vascular accident) ([place]) 12/2015  . Diabetes ([place]) 2017  . Hypertension    Review of Systems:  As per HPI.  Physical Exam:  Vitals:   09/03/16 1322  BP: (!) 136/96  Pulse: 81  Temp: 98.6 F (37 C)  TempSrc: Oral  SpO2: 100%  Weight: 171 lb 1.6 oz (77.6 kg)    General: Vital signs reviewed.  Patient is well-developed and well-nourished, in no acute distress and cooperative with exam.  Head: Normocephalic and atraumatic. Eyes: EOMI, Mild conjunctival erythema, no scleral icterus.  Cardiovascular: RRR, S1 normal, S2 normal, no murmurs, gallops, or rubs. Pulmonary/Chest: Clear to auscultation bilaterally, no wheezes, rales, or rhonchi. Abdominal: Soft, non-tender, non-distended, BS +, no masses, organomegaly, or guarding present.  A small right inguinal hernia with lifting head and upper body against resistance, regresses spontaneously immediately. Musculoskeletal: No joint deformities, erythema, or stiffness, ROM full and nontender. Extremities: No lower extremity edema bilaterally,  pulses symmetric and intact bilaterally. No cyanosis or  clubbing. Skin: Warm, dry and intact. No rashes or erythema. Psychiatric: Normal mood and affect. speech and behavior is normal. Cognition and memory are normal.  Assessment & Plan:   See Encounters Tab for problem based charting.  Patient discussed with Dr. Dareen Piano.

## 2016-09-08 NOTE — Progress Notes (Signed)
Internal Medicine Clinic Attending  Case discussed with Dr. Amin at the time of the visit.  We reviewed the resident's history and exam and pertinent patient test results.  I agree with the assessment, diagnosis, and plan of care documented in the resident's note.    

## 2016-09-17 ENCOUNTER — Ambulatory Visit: Payer: BLUE CROSS/BLUE SHIELD | Admitting: Dietician

## 2016-11-03 ENCOUNTER — Other Ambulatory Visit: Payer: Self-pay | Admitting: Internal Medicine

## 2016-11-22 ENCOUNTER — Other Ambulatory Visit: Payer: Self-pay | Admitting: Internal Medicine

## 2016-12-10 ENCOUNTER — Encounter: Payer: BLUE CROSS/BLUE SHIELD | Admitting: Internal Medicine

## 2016-12-10 ENCOUNTER — Encounter: Payer: BLUE CROSS/BLUE SHIELD | Admitting: Dietician

## 2016-12-24 ENCOUNTER — Ambulatory Visit: Payer: BLUE CROSS/BLUE SHIELD | Admitting: Internal Medicine

## 2016-12-24 ENCOUNTER — Encounter: Payer: Self-pay | Admitting: Internal Medicine

## 2016-12-24 ENCOUNTER — Encounter: Payer: Self-pay | Admitting: Dietician

## 2016-12-24 ENCOUNTER — Other Ambulatory Visit: Payer: Self-pay

## 2016-12-24 ENCOUNTER — Ambulatory Visit (INDEPENDENT_AMBULATORY_CARE_PROVIDER_SITE_OTHER): Payer: BLUE CROSS/BLUE SHIELD | Admitting: Dietician

## 2016-12-24 VITALS — BP 138/91 | HR 62 | Temp 98.1°F | Ht 73.0 in | Wt 178.7 lb

## 2016-12-24 DIAGNOSIS — B353 Tinea pedis: Secondary | ICD-10-CM

## 2016-12-24 DIAGNOSIS — Z79899 Other long term (current) drug therapy: Secondary | ICD-10-CM

## 2016-12-24 DIAGNOSIS — E119 Type 2 diabetes mellitus without complications: Secondary | ICD-10-CM

## 2016-12-24 DIAGNOSIS — Z1211 Encounter for screening for malignant neoplasm of colon: Secondary | ICD-10-CM

## 2016-12-24 DIAGNOSIS — Z720 Tobacco use: Secondary | ICD-10-CM

## 2016-12-24 DIAGNOSIS — Z713 Dietary counseling and surveillance: Secondary | ICD-10-CM

## 2016-12-24 DIAGNOSIS — Z7982 Long term (current) use of aspirin: Secondary | ICD-10-CM

## 2016-12-24 DIAGNOSIS — F17211 Nicotine dependence, cigarettes, in remission: Secondary | ICD-10-CM | POA: Diagnosis not present

## 2016-12-24 DIAGNOSIS — I10 Essential (primary) hypertension: Secondary | ICD-10-CM

## 2016-12-24 DIAGNOSIS — Z7984 Long term (current) use of oral hypoglycemic drugs: Secondary | ICD-10-CM

## 2016-12-24 DIAGNOSIS — Z23 Encounter for immunization: Secondary | ICD-10-CM | POA: Diagnosis not present

## 2016-12-24 DIAGNOSIS — Z0000 Encounter for general adult medical examination without abnormal findings: Secondary | ICD-10-CM

## 2016-12-24 LAB — GLUCOSE, CAPILLARY: Glucose-Capillary: 87 mg/dL (ref 65–99)

## 2016-12-24 LAB — POCT GLYCOSYLATED HEMOGLOBIN (HGB A1C): Hemoglobin A1C: 6.8

## 2016-12-24 MED ORDER — NYSTATIN 100000 UNIT/GM EX POWD: Freq: Four times a day (QID) | CUTANEOUS | 0 refills | Status: DC

## 2016-12-24 NOTE — Patient Instructions (Signed)
Your are doing a great job taking care of your diabetes.   Make a follow up in 1 year or sooner if needed.    Diabetes and Foot Care Diabetes may cause you to have problems because of poor blood supply (circulation) to your feet and legs. This may cause the skin on your feet to become thinner, break easier, and heal more slowly. Your skin may become dry, and the skin may peel and crack. You may also have nerve damage in your legs and feet causing decreased feeling in them. You may not notice minor injuries to your feet that could lead to infections or more serious problems. Taking care of your feet is one of the most important things you can do for yourself. Follow these instructions at home:  Wear shoes at all times, even in the house. Do not go barefoot. Bare feet are easily injured.  Check your feet daily for blisters, cuts, and redness. If you cannot see the bottom of your feet, use a mirror or ask someone for help.  Wash your feet with warm water (do not use hot water) and mild soap. Then pat your feet and the areas between your toes until they are completely dry. Do not soak your feet as this can dry your skin.  Apply a moisturizing lotion or petroleum jelly (that does not contain alcohol and is unscented) to the skin on your feet and to dry, brittle toenails. Do not apply lotion between your toes.  Trim your toenails straight across. Do not dig under them or around the cuticle. File the edges of your nails with an emery board or nail file.  Do not cut corns or calluses or try to remove them with medicine.  Wear clean socks or stockings every day. Make sure they are not too tight. Do not wear knee-high stockings since they may decrease blood flow to your legs.  Wear shoes that fit properly and have enough cushioning. To break in new shoes, wear them for just a few hours a day. This prevents you from injuring your feet. Always look in your shoes before you put them on to be sure there  are no objects inside.  Do not cross your legs. This may decrease the blood flow to your feet.  If you find a minor scrape, cut, or break in the skin on your feet, keep it and the skin around it clean and dry. These areas may be cleansed with mild soap and water. Do not cleanse the area with peroxide, alcohol, or iodine.  When you remove an adhesive bandage, be sure not to damage the skin around it.  If you have a wound, look at it several times a day to make sure it is healing.  Do not use heating pads or hot water bottles. They may burn your skin. If you have lost feeling in your feet or legs, you may not know it is happening until it is too late.  Make sure your health care provider performs a complete foot exam at least annually or more often if you have foot problems. Report any cuts, sores, or bruises to your health care provider immediately. Contact a health care provider if:  You have an injury that is not healing.  You have cuts or breaks in the skin.  You have an ingrown nail.  You notice redness on your legs or feet.  You feel burning or tingling in your legs or feet.  You have pain or cramps  in your legs and feet.  Your legs or feet are numb.  Your feet always feel cold. Get help right away if:  There is increasing redness, swelling, or pain in or around a wound.  There is a red line that goes up your leg.  Pus is coming from a wound.  You develop a fever or as directed by your health care provider.  You notice a bad smell coming from an ulcer or wound.   Debera Lat, Diabetes Educator [phone]

## 2016-12-24 NOTE — Assessment & Plan Note (Signed)
He was provided with Pneumovax 23 and flu vaccine today. He was given referral for eye exam and colonoscopy. Foot exam was done today.

## 2016-12-24 NOTE — Assessment & Plan Note (Signed)
Patient quit smoking about a year ago.

## 2016-12-24 NOTE — Assessment & Plan Note (Signed)
His A1c today was 6.8 which was at goal.  He does follow his dietary restrictions most of the time, occasionally skipping restrictions while on vacation or with the company. He was worried about his eating habits during the holiday season.  Continue Januvia 50 mg daily. Advised him to watch his diet and exercise regularly and follow-up in 22-month to recheck A1c, if it started going up we can increase Januvia 100 mg daily.

## 2016-12-24 NOTE — Assessment & Plan Note (Signed)
His foot exam was consistent with tinea pedis in between 2 left lateral toes.  He does complaint of some itching in that area.  -Nystatin powder. -Keep his foot dry and well aerated.

## 2016-12-24 NOTE — Progress Notes (Signed)
  Medical Nutrition Therapy:  Appt start time: 1430 end time:  15.05 Visit # 4  Assessment:  Primary concerns today: [name]/ new diabetes.   [name]  that he enjoys his food, is trying new things such as fish, broccoli, and red wine. He continues to read food labels for sugar content and limit the amount of added sugars. His weight in increased and continues in target range.   Learning Readiness:Ready & Change in progress  ANTHROPOMETRICS: weight-178.7#, height-73", BMI-23.58, IBW-165-185# MEDICATIONS: norvasc, lisinopril, ASA, HCTZ, Lipitor, januvia 50mg  [name]: a1c maintained at 6.8%, has meter but doesn't check [name] sugars at home DIETARY INTAKE: seems overall balanced, getting a good variety amongst food groups, avoids regular soda, however, works some sweets in moderation  Usual physical activity: [name], swam a lot on vacation  Progress Towards Goal(s):  Some progress.   Nutritional Diagnosis:  NB-1.1 Food and nutrition-related knowledge deficit As related to newly diagnosed diabetes resolved As evidenced by his questions, [name] and weight.     Intervention:  Nutrition education about diabetes foot care, answered his questions about healthy diabetes meal planning.Reminderd him to have report sent to [place] when he gets  Eye exam. Coordination of care: spoke with Dr. Reesa Chew today about patient care  Teaching Method Utilized: Visual, Auditory  Handouts given during visit include: AVS, caing for feet Barriers to learning/adherence to lifestyle change: competing values Demonstrated degree of understanding via:  Teach Back   Monitoring/Evaluation:  Dietary intake, exercise, and body weight in 1 year(s). Seth [name], Seth [name], Seth [name] 12/24/2016 2:25 PM.

## 2016-12-24 NOTE — Progress Notes (Signed)
   CC: For follow-up of his diabetes and hypertension.  HPI:  [name] is a 61 y.o. with past medical history as listed below came to the clinic for follow-up of his diabetes and hypertension.  He recently came back from [place] where he was there for 2 weeks on a vacation.  According to him he was not following his dietary restrictions during that time.  He has no other complaints today. Please see assessment and plan for his chronic problems.  Past Medical History:  Diagnosis Date  . CVA (cerebral vascular accident) ([place]) 12/2015  . Diabetes ([place]) 2017  . Hypertension    Review of Systems:  Negative except mentioned in HPI.  Physical Exam:  Vitals:   12/24/16 1335 12/24/16 1400  BP: (!) 154/90 (!) 138/91  Pulse: 66 62  Temp: 98.1 F (36.7 C)   TempSrc: Oral   SpO2: 100%   Weight: 178 lb 11.2 oz (81.1 kg)   Height: 6\' 1"  (1.854 m)     General: Vital signs reviewed.  Patient is well-developed and well-nourished, in no acute distress and cooperative with exam.  Head: Normocephalic and atraumatic. Eyes: EOMI, conjunctivae normal, no scleral icterus.  Neck: Supple, trachea midline, normal ROM, no JVD, masses, thyromegaly, or carotid bruit present.  Cardiovascular: RRR, S1 normal, S2 normal, no murmurs, gallops, or rubs. Pulmonary/Chest: Clear to auscultation bilaterally, no wheezes, rales, or rhonchi. Abdominal: Soft, non-tender, non-distended, BS +, no masses, organomegaly, or guarding present.  Extremities: No lower extremity edema bilaterally,  pulses symmetric and intact bilaterally. No cyanosis or clubbing. Skin: Warm, dry and intact.  Raw looking moist area between lateral 2 toes.  No obvious sign of  infection or discharge. Psychiatric: Normal mood and affect. speech and behavior is normal. Cognition and memory are normal.  Assessment & Plan:   See Encounters Tab for problem based charting.  Patient discussed with Dr. Daryll Drown.

## 2016-12-24 NOTE — Assessment & Plan Note (Addendum)
BP Readings from Last 3 Encounters:  12/24/16 (!) 138/91  09/03/16 (!) 136/96  07/01/16 (!) 147/84   His blood pressure was high on initial check, which was 315 systolic, which improved to 138 on recheck.  According to patient he is compliant with his medications.  Provided him with low-salt diet information, he try to avoid added salt most of the time.  We will continue current management with amlodipine 10 mg daily, hydrochlorothiazide 25 mg daily and lisinopril 40 mg daily. If  his blood pressure remained elevated we can consider switching hydrochlorothiazide to chlorthalidone.

## 2016-12-24 NOTE — Patient Instructions (Addendum)
Thank you for visiting clinic today. You are doing great with your diabetes, you had A1c of 6.8 which remained stable. Continue using Januvia 50 mg daily, continue watching your diet and exercise regularly. Continue taking your blood pressure medicines as directed. Watch for the sodium content of your diet to keep your blood pressure under good control. I am giving you a referral for eye exam and colonoscopy please make your appointments according to your convenience. Please follow-up in 60-month.   Low-Sodium Eating Plan Sodium, which is an element that makes up salt, helps you maintain a healthy balance of fluids in your body. Too much sodium can increase your blood pressure and cause fluid and waste to be held in your body. Your health care provider or dietitian may recommend following this plan if you have high blood pressure (hypertension), kidney disease, liver disease, or heart failure. Eating less sodium can help lower your blood pressure, reduce swelling, and protect your heart, liver, and kidneys. What are tips for following this plan? General guidelines  Most people on this plan should limit their sodium intake to 1,500-2,000 mg (milligrams) of sodium each day. Reading food labels  The Nutrition Facts label lists the amount of sodium in one serving of the food. If you eat more than one serving, you must multiply the listed amount of sodium by the number of servings.  Choose foods with less than 140 mg of sodium per serving.  Avoid foods with 300 mg of sodium or more per serving. Shopping  Look for lower-sodium products, often labeled as "low-sodium" or "no salt added."  Always check the sodium content even if foods are labeled as "unsalted" or "no salt added".  Buy fresh foods. ? Avoid canned foods and premade or frozen meals. ? Avoid canned, cured, or processed meats  Buy breads that have less than 80 mg of sodium per slice. Cooking  Eat more home-cooked food and less  restaurant, buffet, and fast food.  Avoid adding salt when cooking. Use salt-free seasonings or herbs instead of table salt or sea salt. Check with your health care provider or pharmacist before using salt substitutes.  Cook with plant-based oils, such as canola, sunflower, or olive oil. Meal planning  When eating at a restaurant, ask that your food be prepared with less salt or no salt, if possible.  Avoid foods that contain MSG (monosodium glutamate). MSG is sometimes added to [place] food, bouillon, and some canned foods. What foods are recommended? The items listed may not be a complete list. Talk with your dietitian about what dietary choices are best for you. Grains Low-sodium cereals, including oats, puffed wheat and rice, and shredded wheat. Low-sodium crackers. Unsalted rice. Unsalted pasta. Low-sodium bread. Whole-grain breads and whole-grain pasta. Vegetables Fresh or frozen vegetables. "No salt added" canned vegetables. "No salt added" tomato sauce and paste. Low-sodium or reduced-sodium tomato and vegetable juice. Fruits Fresh, frozen, or canned fruit. Fruit juice. Meats and other protein foods Fresh or frozen (no salt added) meat, poultry, seafood, and fish. Low-sodium canned tuna and salmon. Unsalted nuts. Dried peas, beans, and lentils without added salt. Unsalted canned beans. Eggs. Unsalted nut butters. Dairy Milk. Soy milk. Cheese that is naturally low in sodium, such as ricotta cheese, fresh mozzarella, or Swiss cheese Low-sodium or reduced-sodium cheese. Cream cheese. Yogurt. Fats and oils Unsalted butter. Unsalted margarine with no trans fat. Vegetable oils such as canola or olive oils. Seasonings and other foods Fresh and dried herbs and spices. Salt-free seasonings. Low-sodium  mustard and ketchup. Sodium-free salad dressing. Sodium-free light mayonnaise. Fresh or refrigerated horseradish. Lemon juice. Vinegar. Homemade, reduced-sodium, or low-sodium soups. Unsalted  popcorn and pretzels. Low-salt or salt-free chips. What foods are not recommended? The items listed may not be a complete list. Talk with your dietitian about what dietary choices are best for you. Grains Instant hot cereals. Bread stuffing, pancake, and biscuit mixes. Croutons. Seasoned rice or pasta mixes. Noodle soup cups. Boxed or frozen macaroni and cheese. Regular salted crackers. Self-rising flour. Vegetables Sauerkraut, pickled vegetables, and relishes. Olives. [place] fries. Onion rings. Regular canned vegetables (not low-sodium or reduced-sodium). Regular canned tomato sauce and paste (not low-sodium or reduced-sodium). Regular tomato and vegetable juice (not low-sodium or reduced-sodium). Frozen vegetables in sauces. Meats and other protein foods Meat or fish that is salted, canned, smoked, spiced, or pickled. Bacon, ham, sausage, hotdogs, corned beef, chipped beef, packaged lunch meats, salt pork, jerky, pickled herring, anchovies, regular canned tuna, sardines, salted nuts. Dairy Processed cheese and cheese spreads. Cheese curds. Blue cheese. Feta cheese. String cheese. Regular cottage cheese. Buttermilk. Canned milk. Fats and oils Salted butter. Regular margarine. Ghee. Bacon fat. Seasonings and other foods Onion salt, garlic salt, seasoned salt, table salt, and sea salt. Canned and packaged gravies. Worcestershire sauce. Tartar sauce. Barbecue sauce. Teriyaki sauce. Soy sauce, including reduced-sodium. Steak sauce. Fish sauce. Oyster sauce. Cocktail sauce. Horseradish that you find on the shelf. Regular ketchup and mustard. Meat flavorings and tenderizers. Bouillon cubes. Hot sauce and Tabasco sauce. Premade or packaged marinades. Premade or packaged taco seasonings. Relishes. Regular salad dressings. Salsa. Potato and tortilla chips. Corn chips and puffs. Salted popcorn and pretzels. Canned or dried soups. Pizza. Frozen entrees and pot pies. Summary  Eating less sodium can help lower  your blood pressure, reduce swelling, and protect your heart, liver, and kidneys.  Most people on this plan should limit their sodium intake to 1,500-2,000 mg (milligrams) of sodium each day.  Canned, boxed, and frozen foods are high in sodium. Restaurant foods, fast foods, and pizza are also very high in sodium. You also get sodium by adding salt to food.  Try to cook at home, eat more fresh fruits and vegetables, and eat less fast food, canned, processed, or prepared foods. This information is not intended to replace advice given to you by your health care provider. Make sure you discuss any questions you have with your health care provider. Document Released: 07/03/2001 Document Revised: 01/05/2016 Document Reviewed: 01/05/2016 Elsevier Interactive Patient Education  2017 [employer].

## 2016-12-26 NOTE — Progress Notes (Signed)
Internal Medicine Clinic Attending  Case discussed with Dr. Amin at the time of the visit.  We reviewed the resident's history and exam and pertinent patient test results.  I agree with the assessment, diagnosis, and plan of care documented in the resident's note.    

## 2017-01-12 ENCOUNTER — Encounter: Payer: Self-pay | Admitting: Gastroenterology

## 2017-01-25 HISTORY — PX: COLONOSCOPY: SHX174

## 2017-01-29 DIAGNOSIS — H524 Presbyopia: Secondary | ICD-10-CM | POA: Diagnosis not present

## 2017-02-09 ENCOUNTER — Encounter: Payer: Self-pay | Admitting: *Deleted

## 2017-02-09 DIAGNOSIS — E119 Type 2 diabetes mellitus without complications: Secondary | ICD-10-CM | POA: Diagnosis not present

## 2017-02-09 DIAGNOSIS — H5210 Myopia, unspecified eye: Secondary | ICD-10-CM | POA: Diagnosis not present

## 2017-02-09 LAB — HM DIABETES EYE EXAM

## 2017-02-20 ENCOUNTER — Other Ambulatory Visit: Payer: Self-pay | Admitting: Internal Medicine

## 2017-02-25 ENCOUNTER — Other Ambulatory Visit: Payer: Self-pay

## 2017-02-25 ENCOUNTER — Ambulatory Visit (AMBULATORY_SURGERY_CENTER): Payer: Self-pay | Admitting: *Deleted

## 2017-02-25 ENCOUNTER — Encounter: Payer: Self-pay | Admitting: Gastroenterology

## 2017-02-25 VITALS — Ht 73.5 in | Wt 185.0 lb

## 2017-02-25 DIAGNOSIS — Z1211 Encounter for screening for malignant neoplasm of colon: Secondary | ICD-10-CM

## 2017-02-25 MED ORDER — NA SULFATE-K SULFATE-MG SULF 17.5-3.13-1.6 GM/177ML PO SOLN: ORAL | 0 refills | Status: DC

## 2017-02-25 NOTE — Progress Notes (Signed)
Patient denies any allergies to eggs or soy. Patient denies any problems with anesthesia/sedation. Patient denies any oxygen use at home. Patient denies taking any diet/weight loss medications or blood thinners. EMMI education assisgned to patient on colonoscopy, this was explained and instructions given to patient. $50 coupon Suprep given to pt.

## 2017-03-11 ENCOUNTER — Other Ambulatory Visit: Payer: Self-pay

## 2017-03-11 ENCOUNTER — Ambulatory Visit (AMBULATORY_SURGERY_CENTER): Payer: BLUE CROSS/BLUE SHIELD | Admitting: Gastroenterology

## 2017-03-11 ENCOUNTER — Encounter: Payer: Self-pay | Admitting: Gastroenterology

## 2017-03-11 VITALS — BP 127/83 | HR 65 | Temp 99.1°F | Resp 12 | Ht 73.5 in | Wt 185.0 lb

## 2017-03-11 DIAGNOSIS — D123 Benign neoplasm of transverse colon: Secondary | ICD-10-CM | POA: Diagnosis not present

## 2017-03-11 DIAGNOSIS — Z1211 Encounter for screening for malignant neoplasm of colon: Secondary | ICD-10-CM | POA: Diagnosis present

## 2017-03-11 MED ORDER — SODIUM CHLORIDE 0.9 % IV SOLN: 500.0000 mL | Freq: Once | INTRAVENOUS | Status: DC

## 2017-03-11 NOTE — Op Note (Signed)
Seth [name] Patient Name: Seth [name] Procedure Date: 03/11/2017 8:36 AM MRN: [id number] Endoscopist: [place]. Seth [name] , MD Age: 62 Referring MD:  Date of Birth: 07/18/1955 Gender: Male Account #: [id number] Procedure:                Colonoscopy Indications:              Screening for colorectal malignant neoplasm, This                            is the patient's first colonoscopy Medicines:                Monitored Anesthesia Care Procedure:                Pre-Anesthesia Assessment:                           - Prior to the procedure, a History and Physical                            was performed, and patient medications and                            allergies were reviewed. The patient's tolerance of                            previous anesthesia was also reviewed. The risks                            and benefits of the procedure and the sedation                            options and risks were discussed with the patient.                            All questions were answered, and informed consent                            was obtained. Anticoagulants: The patient has taken                            aspirin. It was decided not to withhold this                            medication prior to the procedure. ASA Grade                            Assessment: III - A patient with severe systemic                            disease. After reviewing the risks and benefits,                            the patient was deemed in satisfactory condition to  undergo the procedure.                           After obtaining informed consent, the colonoscope                            was passed under direct vision. Throughout the                            procedure, the patient's blood pressure, pulse, and                            oxygen saturations were monitored continuously. The                            Colonoscope was introduced through the anus and                             advanced to the the cecum, identified by                            appendiceal orifice and ileocecal valve. The                            colonoscopy was performed without difficulty. The                            patient tolerated the procedure well. The quality                            of the bowel preparation was excellent. The                            ileocecal valve, appendiceal orifice, and rectum                            were photographed. The bowel preparation used was                            SUPREP. Scope In: 8:40:45 AM Scope Out: 8:52:38 AM Scope Withdrawal Time: 0 hours 10 minutes 10 seconds  Total Procedure Duration: 0 hours 11 minutes 53 seconds  Findings:                 The perianal and digital rectal examinations were                            normal.                           An 8-10 mm polyp was found in the splenic flexure.                            The polyp was pedunculated. The polyp was removed  with a hot snare. Resection and retrieval were                            complete.                           The exam was otherwise without abnormality on                            direct and retroflexion views. Complications:            No immediate complications. Estimated Blood Loss:     Estimated blood loss: none. Impression:               - One 8-10 mm polyp at the splenic flexure, removed                            with a hot snare. Resected and retrieved.                           - The examination was otherwise normal on direct                            and retroflexion views. Recommendation:           - Patient has a contact number available for                            emergencies. The signs and symptoms of potential                            delayed complications were discussed with the                            patient. Return to normal activities tomorrow.                             Written discharge instructions were provided to the                            patient.                           - Resume previous diet.                           - No aspirin, ibuprofen, naproxen, or other                            non-steroidal anti-inflammatory drugs for 5 days                            after polyp removal.                           - Await pathology results.                           -  Repeat colonoscopy is recommended for                            surveillance. The colonoscopy date will be                            determined after pathology results from today's                            exam become available for review. Connor Meacham L. Seth Carrow, MD 03/11/2017 8:59:06 AM This report has been signed electronically.

## 2017-03-11 NOTE — Progress Notes (Signed)
A and O x3. Report to RN. Tolerated MAC anesthesia well.

## 2017-03-11 NOTE — Patient Instructions (Addendum)
YOU HAD AN ENDOSCOPIC PROCEDURE TODAY AT [facility]:   Refer to the procedure report that was given to you for any specific questions about what was found during the examination.  If the procedure report does not answer your questions, please call your gastroenterologist to clarify.  If you requested that your care partner not be given the details of your procedure findings, then the procedure report has been included in a sealed envelope for you to review at your convenience later.  YOU SHOULD EXPECT: Some feelings of bloating in the abdomen. Passage of more gas than usual.  Walking can help get rid of the air that was put into your GI tract during the procedure and reduce the bloating. If you had a lower endoscopy (such as a colonoscopy or flexible sigmoidoscopy) you may notice spotting of blood in your stool or on the toilet paper. If you underwent a bowel prep for your procedure, you may not have a normal bowel movement for a few days.  Please Note:  You might notice some irritation and congestion in your nose or some drainage.  This is from the oxygen used during your procedure.  There is no need for concern and it should clear up in a day or so.  SYMPTOMS TO REPORT IMMEDIATELY:   Following lower endoscopy (colonoscopy or flexible sigmoidoscopy):  Excessive amounts of blood in the stool  Significant tenderness or worsening of abdominal pains  Swelling of the abdomen that is new, acute  Fever of 100F or higher    For urgent or emergent issues, a gastroenterologist can be reached at any hour by calling [phone].   DIET:  We do recommend a small meal at first, but then you may proceed to your regular diet.  Drink plenty of fluids but you should avoid alcoholic beverages for 24 hours.  ACTIVITY:  You should plan to take it easy for the rest of today and you should NOT DRIVE or use heavy machinery until tomorrow (because of the sedation medicines used during the test).     FOLLOW UP: Our staff will call the number listed on your records the next business day following your procedure to check on you and address any questions or concerns that you may have regarding the information given to you following your procedure. If we do not reach you, we will leave a message.  However, if you are feeling well and you are not experiencing any problems, there is no need to return our call.  We will assume that you have returned to your regular daily activities without incident.  If any biopsies were taken you will be contacted by phone or by letter within the next 1-3 weeks.  Please call us at [phone] if you have not heard about the biopsies in 3 weeks.    SIGNATURES/CONFIDENTIALITY: You and/or your care partner have signed paperwork which will be entered into your electronic medical record.  These signatures attest to the fact that that the information above on your After Visit Summary has been reviewed and is understood.  Full responsibility of the confidentiality of this discharge information lies with you and/or your care-partner.   NO Aspirin,ibuprofen,naproxen,or other non-steroidal anti-inflammatory drugs for 5 days resume remainder of medications. Information given on polyps.

## 2017-03-11 NOTE — Progress Notes (Signed)
Called to room to assist during endoscopic procedure.  Patient ID and intended procedure confirmed with present staff. Received instructions for my participation in the procedure from the performing physician.  

## 2017-03-14 ENCOUNTER — Telehealth: Payer: Self-pay

## 2017-03-14 ENCOUNTER — Telehealth: Payer: Self-pay | Admitting: *Deleted

## 2017-03-14 NOTE — Telephone Encounter (Signed)
Called [phone] and unable to leave a message the mailbox has not been set up. maw

## 2017-03-14 NOTE — Telephone Encounter (Signed)
Voicemail not set up yet ,unable to leave message on f/u call

## 2017-03-18 ENCOUNTER — Encounter: Payer: Self-pay | Admitting: Gastroenterology

## 2017-05-21 ENCOUNTER — Other Ambulatory Visit: Payer: Self-pay | Admitting: Internal Medicine

## 2017-05-23 ENCOUNTER — Other Ambulatory Visit: Payer: Self-pay | Admitting: Internal Medicine

## 2017-05-23 DIAGNOSIS — I10 Essential (primary) hypertension: Secondary | ICD-10-CM

## 2017-05-24 NOTE — Telephone Encounter (Signed)
Can you please schedule an appointment? 

## 2017-05-27 NOTE — Telephone Encounter (Signed)
Appt sch with PCP 07/15/2017 with Dr. Reesa Chew.  Pt has been notified.

## 2017-06-09 ENCOUNTER — Encounter: Payer: Self-pay | Admitting: *Deleted

## 2017-07-08 ENCOUNTER — Encounter (INDEPENDENT_AMBULATORY_CARE_PROVIDER_SITE_OTHER): Payer: Self-pay

## 2017-07-08 ENCOUNTER — Other Ambulatory Visit: Payer: Self-pay

## 2017-07-08 ENCOUNTER — Ambulatory Visit: Payer: BLUE CROSS/BLUE SHIELD | Admitting: Internal Medicine

## 2017-07-08 ENCOUNTER — Encounter: Payer: Self-pay | Admitting: Internal Medicine

## 2017-07-08 VITALS — BP 111/80 | HR 82 | Temp 98.2°F | Ht 73.0 in | Wt 185.0 lb

## 2017-07-08 DIAGNOSIS — E119 Type 2 diabetes mellitus without complications: Secondary | ICD-10-CM | POA: Diagnosis not present

## 2017-07-08 DIAGNOSIS — I10 Essential (primary) hypertension: Secondary | ICD-10-CM | POA: Diagnosis not present

## 2017-07-08 DIAGNOSIS — Z8673 Personal history of transient ischemic attack (TIA), and cerebral infarction without residual deficits: Secondary | ICD-10-CM

## 2017-07-08 DIAGNOSIS — N529 Male erectile dysfunction, unspecified: Secondary | ICD-10-CM | POA: Diagnosis not present

## 2017-07-08 LAB — GLUCOSE, CAPILLARY: Glucose-Capillary: 142 mg/dL — ABNORMAL HIGH (ref 65–99)

## 2017-07-08 LAB — POCT GLYCOSYLATED HEMOGLOBIN (HGB A1C): Hemoglobin A1C: 7.4 % — AB (ref 4.0–5.6)

## 2017-07-08 MED ORDER — TADALAFIL 10 MG PO TABS: 10.0000 mg | ORAL_TABLET | ORAL | 1 refills | Status: DC | PRN

## 2017-07-08 MED ORDER — SITAGLIPTIN PHOSPHATE 100 MG PO TABS: 100.0000 mg | ORAL_TABLET | Freq: Every day | ORAL | 11 refills | Status: DC

## 2017-07-08 NOTE — Progress Notes (Signed)
   CC: For follow-up of his diabetes and hypertension.  HPI:  [name] is a 62 y.o. with past medical history as listed below came to the clinic for follow-up of his diabetes and hypertension.  He was asking for a prescription for Cialis, for his erectile dysfunction, he is having difficulty  maintaining erection.  Do get morning erections.  He tried Cialis given to him by 1 of his friend and it was taking care of his dysfunction and asking for his own prescription.  He went to [facility] 2 weeks ago and was smoking and using marijuana for 4 days.  No uses since he came back.  He quit smoking 1 year ago.  He has no other complaints.  Please see assessment and plan for his chronic conditions.  Past Medical History:  Diagnosis Date  . CVA (cerebral vascular accident) ([place]) 12/2015  . Diabetes ([place]) 2017  . Hyperlipidemia   . Hypertension    Review of Systems: Negative except mentioned in HPI.  Physical Exam:  Vitals:   07/08/17 1348  BP: 111/80  Pulse: 82  Temp: 98.2 F (36.8 C)  TempSrc: Oral  SpO2: 99%  Weight: 185 lb (83.9 kg)  Height: 6\' 1"  (1.854 m)    General: Vital signs reviewed.  Patient is well-developed and well-nourished, in no acute distress and cooperative with exam.  Head: Normocephalic and atraumatic. Eyes: EOMI, conjunctivae normal, no scleral icterus.  Cardiovascular: RRR, S1 normal, S2 normal, no murmurs, gallops, or rubs. Pulmonary/Chest: Clear to auscultation bilaterally, no wheezes, rales, or rhonchi. Abdominal: Soft, non-tender, non-distended, BS + Extremities: No lower extremity edema bilaterally,  pulses symmetric and intact bilaterally. No cyanosis or clubbing. Neurological: A&O x3, Strength is normal and symmetric bilaterally, cranial nerve II-XII are grossly intact, no focal motor deficit, sensory intact to light touch bilaterally.  Skin: Warm, dry and intact. No rashes or erythema. Psychiatric: Normal mood and affect. speech and  behavior is normal. Cognition and memory are normal.  Assessment & Plan:   See Encounters Tab for problem based charting.  Patient discussed with Dr. Rebeca Alert.

## 2017-07-08 NOTE — Assessment & Plan Note (Signed)
His A1c today was 7.4, increased from previous check of 6.8 with CBG of 142 today. He does not check his blood sugar at home.  He is just on Januvia 50 mg daily. In the past has been tried metformin-unable to tolerate it because of GI side effects.  -Increase Januvia 200 mg daily. -We will up in 31-month with repeat A1c.

## 2017-07-08 NOTE — Patient Instructions (Addendum)
Thank you for visiting clinic today. As we discussed your A1c is little increased today, it was 7.4, we would like to keep it around 6.5. Please follow your diabetic diet carefully and exercise regularly. I am increasing the dose of Januvia  from 50 mg to 100 mg daily, you can finish up your existing medication by taking 2 pills at a time and then start taking your new prescription. I am also giving you a prescription of Cialis for your erectile dysfunction, take it 30 minutes before any sexual encounter. Follow-up in 43-month.   Tadalafil tablets (Cialis) What is this medicine? TADALAFIL (tah DA la fil) is used to treat erection problems in men. It is also used for enlargement of the prostate gland in men, a condition called benign prostatic hyperplasia or BPH. This medicine improves urine flow and reduces BPH symptoms. This medicine can also treat both erection problems and BPH when they occur together. This medicine may be used for other purposes; ask your health care provider or pharmacist if you have questions. COMMON BRAND NAME(S): Cialis What should I tell my health care provider before I take this medicine? They need to know if you have any of these conditions: -bleeding disorders -eye or vision problems, including a rare inherited eye disease called retinitis pigmentosa -anatomical deformation of the penis, Peyronie's disease, or history of priapism (painful and prolonged erection) -heart disease, angina, a history of heart attack, irregular heart beats, or other heart problems -high or low blood pressure -history of blood diseases, like sickle cell anemia or leukemia -history of stomach bleeding -kidney disease -liver disease -stroke -an unusual or allergic reaction to tadalafil, other medicines, foods, dyes, or preservatives -pregnant or trying to get pregnant -breast-feeding How should I use this medicine? Take this medicine by mouth with a glass of water. Follow the  directions on the prescription label. You may take this medicine with or without meals. When this medicine is used for erection problems, your doctor may prescribe it to be taken once daily or as needed. If you are taking the medicine as needed, you may be able to have sexual activity 30 minutes after taking it and for up to 36 hours after taking it. Whether you are taking the medicine as needed or once daily, you should not take more than one dose per day. If you are taking this medicine for symptoms of benign prostatic hyperplasia (BPH) or to treat both BPH and an erection problem, take the dose once daily at about the same time each day. Do not take your medicine more often than directed. Talk to your pediatrician regarding the use of this medicine in children. Special care may be needed. Overdosage: If you think you have taken too much of this medicine contact a poison control center or emergency room at once. NOTE: This medicine is only for you. Do not share this medicine with others. What if I miss a dose? If you are taking this medicine as needed for erection problems, this does not apply. If you miss a dose while taking this medicine once daily for an erection problem, benign prostatic hyperplasia, or both, take it as soon as you remember, but do not take more than one dose per day. What may interact with this medicine? Do not take this medicine with any of the following medications: -nitrates like amyl nitrite, isosorbide dinitrate, isosorbide mononitrate, nitroglycerin -other medicines for erectile dysfunction like avanafil, sildenafil, vardenafil -other tadalafil products (Adcirca) -riociguat This medicine may also interact  with the following medications: -certain drugs for high blood pressure -certain drugs for the treatment of HIV infection or AIDS -certain drugs used for fungal or yeast infections, like fluconazole, itraconazole, ketoconazole, and voriconazole -certain drugs used for  seizures like carbamazepine, phenytoin, and phenobarbital -grapefruit juice -macrolide antibiotics like clarithromycin, erythromycin, troleandomycin -medicines for prostate problems -rifabutin, rifampin or rifapentine This list may not describe all possible interactions. Give your health care provider a list of all the medicines, herbs, non-prescription drugs, or dietary supplements you use. Also tell them if you smoke, drink alcohol, or use illegal drugs. Some items may interact with your medicine. What should I watch for while using this medicine? If you notice any changes in your vision while taking this drug, call your doctor or health care professional as soon as possible. Stop using this medicine and call your health care provider right away if you have a loss of sight in one or both eyes. Contact your doctor or health care professional right away if the erection lasts longer than 4 hours or if it becomes painful. This may be a sign of serious problem and must be treated right away to prevent permanent damage. If you experience symptoms of nausea, dizziness, chest pain or arm pain upon initiation of sexual activity after taking this medicine, you should refrain from further activity and call your doctor or health care professional as soon as possible. Do not drink alcohol to excess (examples, 5 glasses of wine or 5 shots of whiskey) when taking this medicine. When taken in excess, alcohol can increase your chances of getting a headache or getting dizzy, increasing your heart rate or lowering your blood pressure. Using this medicine does not protect you or your partner against HIV infection (the virus that causes AIDS) or other sexually transmitted diseases. What side effects may I notice from receiving this medicine? Side effects that you should report to your doctor or health care professional as soon as possible: -allergic reactions like skin rash, itching or hives, swelling of the face, lips,  or tongue -breathing problems -changes in hearing -changes in vision -chest pain -fast, irregular heartbeat -prolonged or painful erection -seizures Side effects that usually do not require medical attention (report to your doctor or health care professional if they continue or are bothersome): -back pain -dizziness -flushing -headache -indigestion -muscle aches -nausea -stuffy or runny nose This list may not describe all possible side effects. Call your doctor for medical advice about side effects. You may report side effects to FDA at 1-800-FDA-1088. Where should I keep my medicine? Keep out of the reach of children. Store at room temperature between 15 and 30 degrees C (59 and 86 degrees F). Throw away any unused medicine after the expiration date. NOTE: This sheet is a summary. It may not cover all possible information. If you have questions about this medicine, talk to your doctor, pharmacist, or health care provider.  2018 Elsevier/Gold Standard (2013-06-01 13:15:49)

## 2017-07-08 NOTE — Assessment & Plan Note (Signed)
BP Readings from Last 3 Encounters:  07/08/17 111/80  03/11/17 127/83  12/24/16 (!) 138/91   He was normotensive today. He denies any dizziness.  Continue current management with lisinopril, amlodipine and HCTZ.

## 2017-07-08 NOTE — Assessment & Plan Note (Signed)
He was asking for a prescription for Cialis, for his erectile dysfunction, he is having difficulty  maintaining erection.  Do get morning erections.  He tried Cialis given to him by 1 of his friend and it was taking care of his dysfunction and asking for his own prescription.  Prescription for Cialis 10 mg to be used 30 minutes prior to any sexual encounter was provided.

## 2017-07-08 NOTE — Assessment & Plan Note (Signed)
Current with his Lipitor 80 mg daily. No new deficit.

## 2017-07-13 NOTE — Progress Notes (Signed)
Internal Medicine Clinic Attending  Case discussed with Dr. Amin  at the time of the visit.  We reviewed the resident's history and exam and pertinent patient test results.  I agree with the assessment, diagnosis, and plan of care documented in the resident's note.  Alexander N Raines, MD   

## 2017-07-15 ENCOUNTER — Encounter: Payer: BLUE CROSS/BLUE SHIELD | Admitting: Internal Medicine

## 2017-07-26 ENCOUNTER — Other Ambulatory Visit: Payer: Self-pay | Admitting: Internal Medicine

## 2017-07-26 NOTE — Telephone Encounter (Signed)
Next appt scheduled 9/16 with PCP.

## 2017-08-01 ENCOUNTER — Other Ambulatory Visit: Payer: Self-pay | Admitting: Internal Medicine

## 2017-10-10 ENCOUNTER — Encounter: Payer: Self-pay | Admitting: Internal Medicine

## 2017-10-10 ENCOUNTER — Ambulatory Visit: Payer: BLUE CROSS/BLUE SHIELD | Admitting: Internal Medicine

## 2017-10-10 ENCOUNTER — Other Ambulatory Visit: Payer: Self-pay

## 2017-10-10 VITALS — BP 127/81 | HR 67 | Temp 99.0°F | Wt 179.5 lb

## 2017-10-10 DIAGNOSIS — Z79899 Other long term (current) drug therapy: Secondary | ICD-10-CM

## 2017-10-10 DIAGNOSIS — E119 Type 2 diabetes mellitus without complications: Secondary | ICD-10-CM | POA: Diagnosis not present

## 2017-10-10 DIAGNOSIS — Z7984 Long term (current) use of oral hypoglycemic drugs: Secondary | ICD-10-CM

## 2017-10-10 DIAGNOSIS — K4090 Unilateral inguinal hernia, without obstruction or gangrene, not specified as recurrent: Secondary | ICD-10-CM

## 2017-10-10 DIAGNOSIS — N529 Male erectile dysfunction, unspecified: Secondary | ICD-10-CM | POA: Diagnosis not present

## 2017-10-10 DIAGNOSIS — I10 Essential (primary) hypertension: Secondary | ICD-10-CM | POA: Diagnosis not present

## 2017-10-10 DIAGNOSIS — Z23 Encounter for immunization: Secondary | ICD-10-CM

## 2017-10-10 LAB — GLUCOSE, CAPILLARY: Glucose-Capillary: 78 mg/dL (ref 70–99)

## 2017-10-10 LAB — POCT GLYCOSYLATED HEMOGLOBIN (HGB A1C): Hemoglobin A1C: 6.8 % — AB (ref 4.0–5.6)

## 2017-10-10 MED ORDER — HYDROCHLOROTHIAZIDE 25 MG PO TABS: 25.0000 mg | ORAL_TABLET | Freq: Every day | ORAL | 2 refills | Status: DC

## 2017-10-10 MED ORDER — TADALAFIL 10 MG PO TABS: 15.0000 mg | ORAL_TABLET | ORAL | 1 refills | Status: DC | PRN

## 2017-10-10 MED ORDER — SITAGLIPTIN PHOSPHATE 100 MG PO TABS: 100.0000 mg | ORAL_TABLET | Freq: Every day | ORAL | 3 refills | Status: DC

## 2017-10-10 MED ORDER — ATORVASTATIN CALCIUM 80 MG PO TABS: 80.0000 mg | ORAL_TABLET | Freq: Every day | ORAL | 2 refills | Status: DC

## 2017-10-10 MED ORDER — AMLODIPINE BESYLATE 10 MG PO TABS: 10.0000 mg | ORAL_TABLET | Freq: Every day | ORAL | 3 refills | Status: DC

## 2017-10-10 MED ORDER — LISINOPRIL 40 MG PO TABS: 40.0000 mg | ORAL_TABLET | Freq: Every day | ORAL | 3 refills | Status: DC

## 2017-10-10 MED ORDER — ASPIRIN 81 MG PO TBEC: 81.0000 mg | DELAYED_RELEASE_TABLET | Freq: Every day | ORAL | 2 refills | Status: DC

## 2017-10-10 NOTE — Assessment & Plan Note (Signed)
Patient does not check his blood sugar at home. A1c today was 6.8, decreased from 7.4. Currently he is taking Januvia 100 mg daily.  Continue taking Januvia 100 mg daily.

## 2017-10-10 NOTE — Patient Instructions (Signed)
Thank you for visiting clinic today. You are doing very well with your blood pressure and diabetes, keep up the good work. I am giving you a referral to see a surgeon for your hernia evaluation. We are also increasing the dose of Cialis to 15 mg for your erectile dysfunction. Please follow-up in 50-month.

## 2017-10-10 NOTE — Assessment & Plan Note (Signed)
Patient has easily reducible right inguinal hernia, apparently it is bothering him more and he is feeling that it is increasing in size. He wants to get an evaluation by a surgeon.  Referral for general surgery was provided.

## 2017-10-10 NOTE — Assessment & Plan Note (Signed)
Flu shot was provided. 

## 2017-10-10 NOTE — Assessment & Plan Note (Signed)
Patient was complaining of Cialis is not working the way it used to work before and asking for a higher dose.  Cialis dose was increased from 10 to 15 mg as needed.

## 2017-10-10 NOTE — Progress Notes (Signed)
   CC: For follow-up of his diabetes and hypertension.  HPI:  [name] is a 62 y.o. with past medical history as listed below came to the clinic for follow-up of his diabetes and hypertension.  His right inguinal hernia is becoming more prominent although still reducible but he wants to be evaluated by a surgeon.  Denies any pain.  He continued to have little worsening of his erectile dysfunction, stating Cialis now is not working the way it used to work before and asking for a increase in dose.  He has no other complaints.  See assessment and plan for his chronic conditions.  Past Medical History:  Diagnosis Date  . CVA (cerebral vascular accident) ([place]) 12/2015  . Diabetes ([place]) 2017  . Hyperlipidemia   . Hypertension    Review of Systems: Negative except mentioned in HPI.  Physical Exam:  Vitals:   10/10/17 1447  BP: 127/81  Pulse: 67  Temp: 99 F (37.2 C)  TempSrc: Oral  SpO2: 100%  Weight: 179 lb 8 oz (81.4 kg)   General: Vital signs reviewed.  Patient is well-developed and well-nourished, in no acute distress and cooperative with exam.  Head: Normocephalic and atraumatic. Eyes: EOMI, conjunctivae normal, no scleral icterus.  Cardiovascular: RRR, S1 normal, S2 normal, no murmurs, gallops, or rubs. Pulmonary/Chest: Clear to auscultation bilaterally, no wheezes, rales, or rhonchi. Abdominal: Soft, non-tender, non-distended, BS +, easily reducible inguinal hernia. Extremities: No lower extremity edema bilaterally,  pulses symmetric and intact bilaterally. No cyanosis or clubbing. Skin: Warm, dry and intact. No rashes or erythema. Psychiatric: Normal mood and affect. speech and behavior is normal. Cognition and memory are normal.  Assessment & Plan:   See Encounters Tab for problem based charting.  Patient discussed with Dr. Daryll Drown.

## 2017-10-10 NOTE — Assessment & Plan Note (Signed)
BP Readings from Last 3 Encounters:  10/10/17 127/81  07/08/17 111/80  03/11/17 127/83   His blood pressure was within goal. He is compliant with his lisinopril, amlodipine and HCTZ.  -Continue current management- refill was provided.

## 2017-10-20 NOTE — Progress Notes (Signed)
Internal Medicine Clinic Attending  Case discussed with Dr. Amin at the time of the visit.  We reviewed the resident's history and exam and pertinent patient test results.  I agree with the assessment, diagnosis, and plan of care documented in the resident's note.    

## 2018-01-29 IMAGING — CT CT HEAD W/O CM
4 series · 15 of 47 positions shown, 17 images · non-contrast
Comparison: None.

CLINICAL DATA: Hypertension, left-sided leg and finger numbness

EXAM:
CT HEAD WITHOUT CONTRAST
TECHNIQUE: Contiguous axial images were obtained from the base of the skull
through the vertex without intravenous contrast.

[Series 2: head without · axial · non-contrast · 0.50mm/px · z∈[-139,-19]mm · 7 of 32 slices shown, 9 images]
[im 4/32  brain]
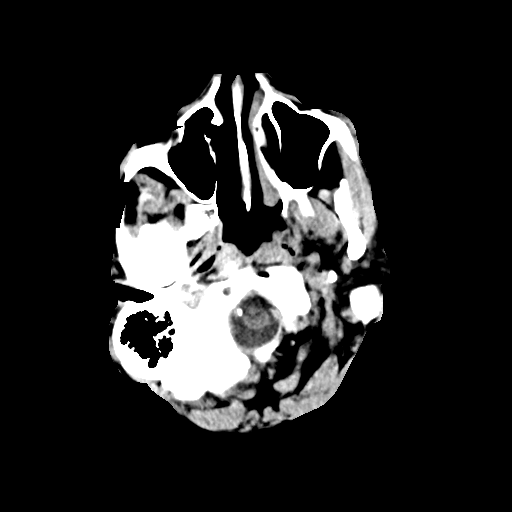
[im 4/32  bone]
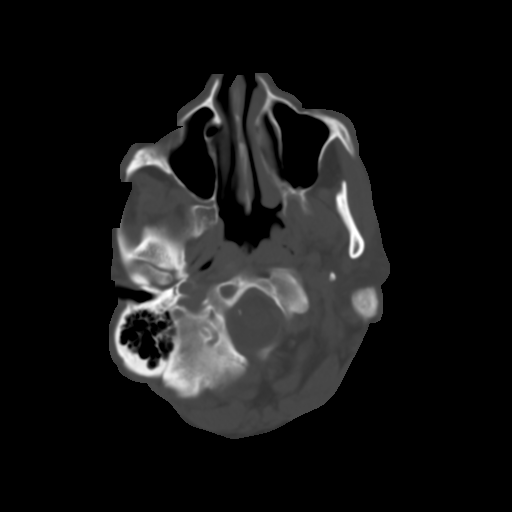
[im 8/32  brain]
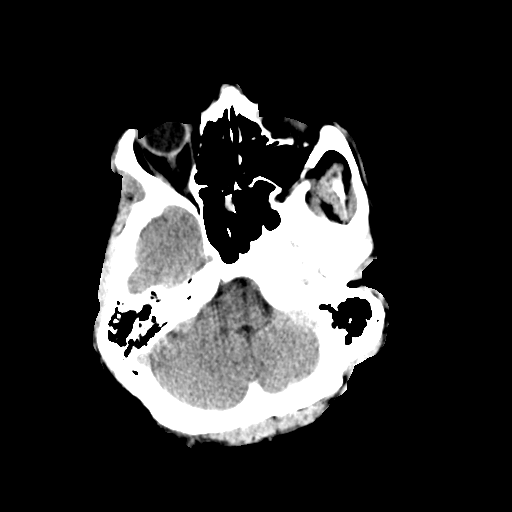
[im 12/32  brain]
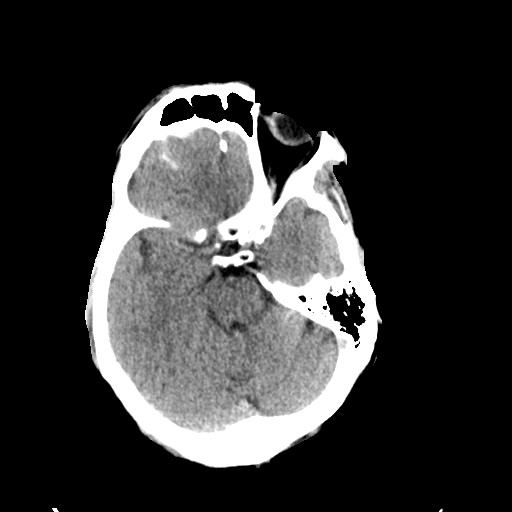
[im 16/32  brain]
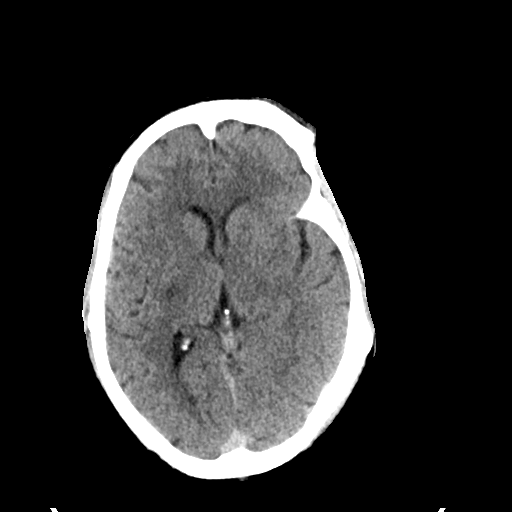
[im 20/32  brain]
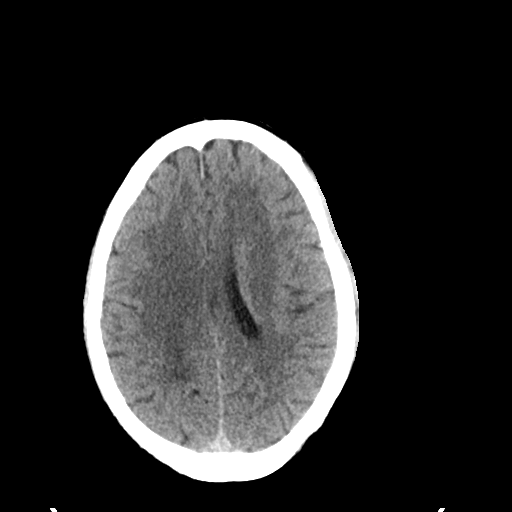
[im 20/32  bone]
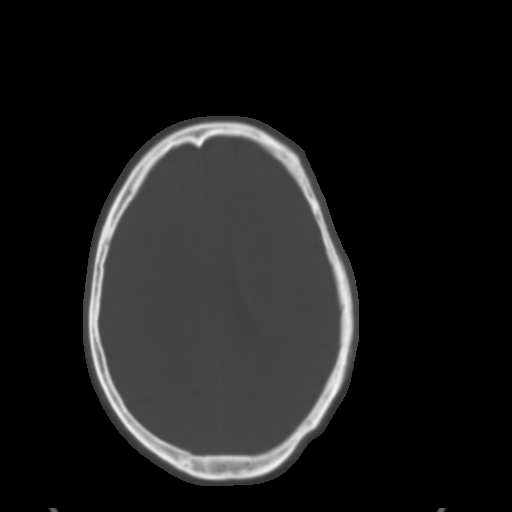
[im 24/32  brain]
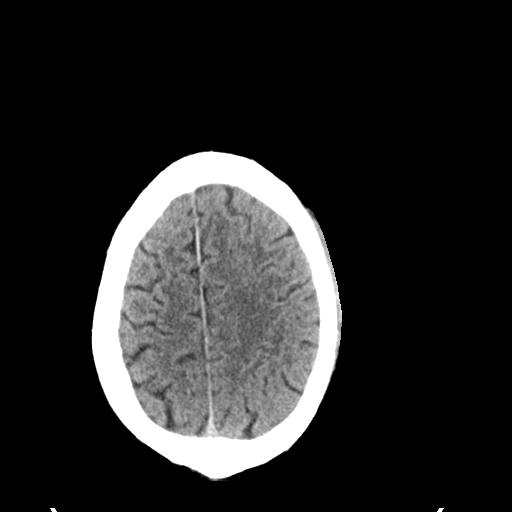
[im 28/32  brain]
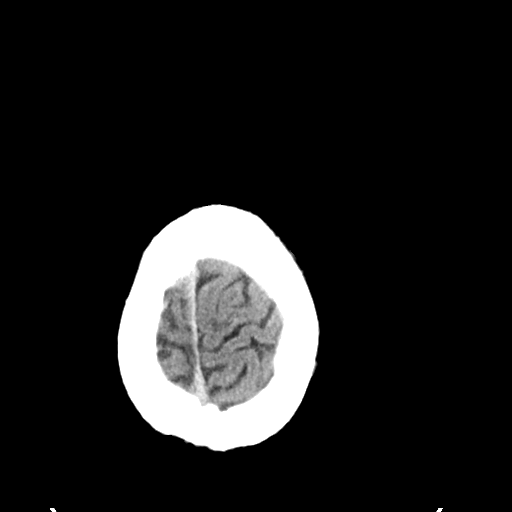

[Series 3: head bone · axial · 0.50mm/px · z∈[-140,-124]mm · 2 of 79 slices shown]
[im 8/79  bone]
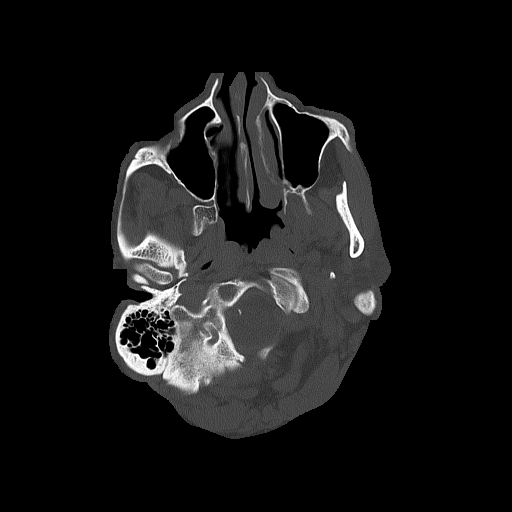
[im 16/79  bone]
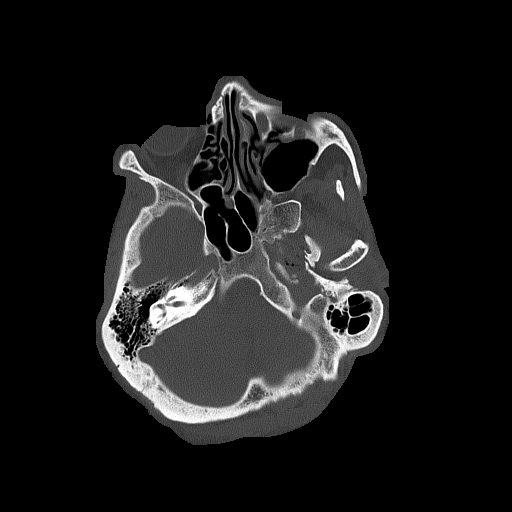

[Series 4: head without cor · coronal · non-contrast · 0.31mm/px · 3 of 69 slices shown]
[im 23/69  brain]
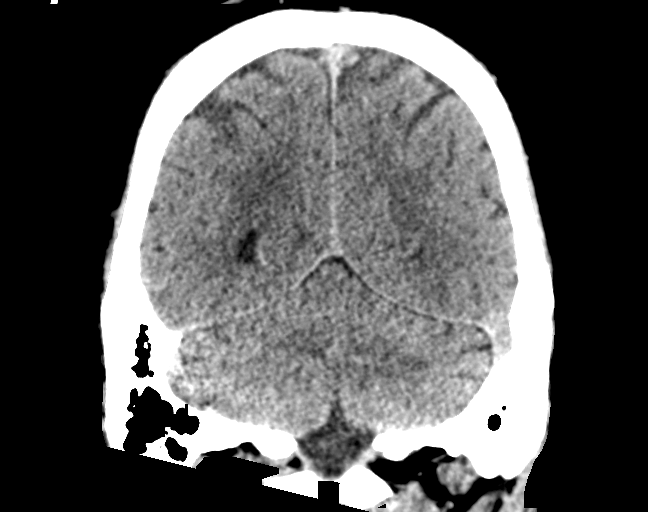
[im 31/69  brain]
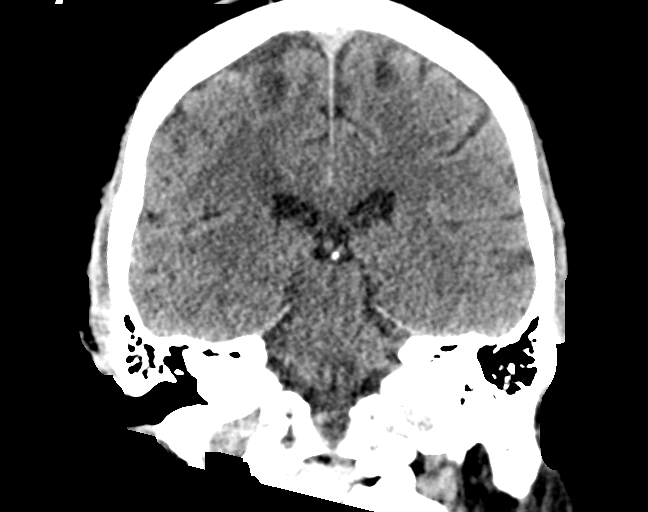
[im 38/69  brain]
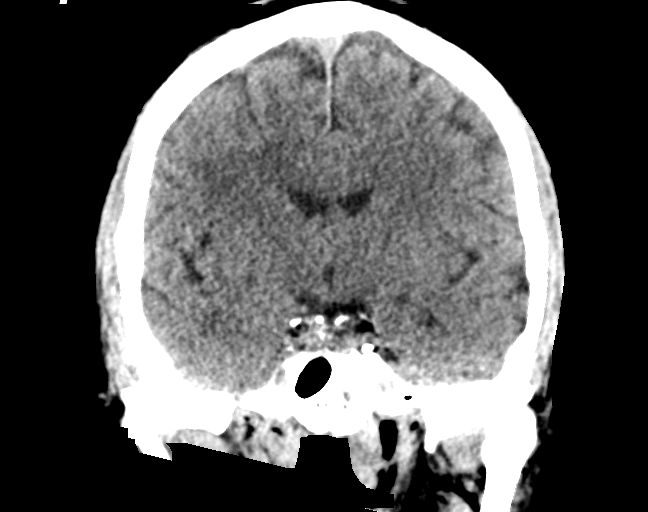

[Series 5: head without sag · sagittal · non-contrast · 0.31mm/px · 3 of 57 slices shown]
[im 24/57  brain]
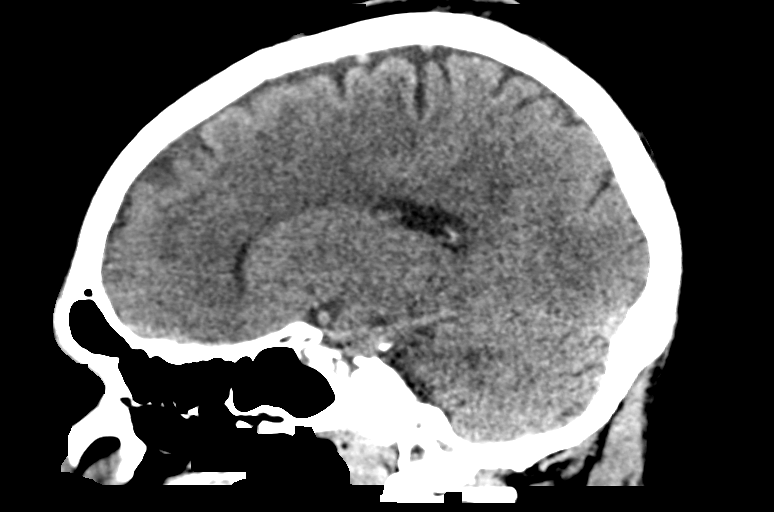
[im 29/57  brain]
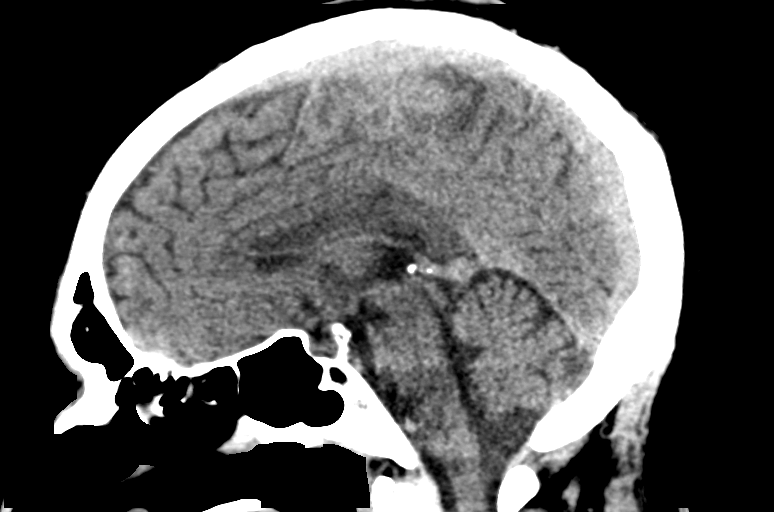
[im 33/57  brain]
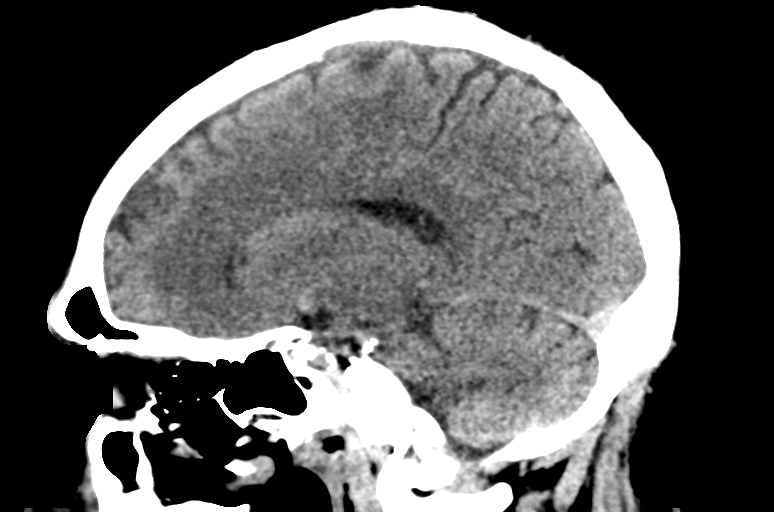

[15 of 47 positions shown; findings below may reference images not displayed]

FINDINGS: Brain: The ventricular system is normal in size and configuration
and the septum is in a normal midline position. The fourth ventricle
and basilar cisterns are unremarkable. There is a right basal
ganglial lacunar infarct present which may be acute or subacute. No
hemorrhage is seen. Mild small vessel ischemic change is noted
within the periventricular white matter.

Vascular: No vascular abnormality is seen on this unenhanced study.

Skull: No calvarial lesion is evident.

Sinuses/Orbits: The paranasal sinuses is with a small probable
retention cyst within the right partition of the sphenoid sinus.
There also is a probable retention cyst medially within the left
maxillary sinus.

Other: None
IMPRESSION: 1. Small lacunar infarct in the right basal ganglia may be acute or
subacute. No hemorrhage.
2. Mild small vessel ischemic change in the periventricular white
matter.
3. Sphenoid and left maxillary retention cysts.

## 2018-02-21 DIAGNOSIS — E119 Type 2 diabetes mellitus without complications: Secondary | ICD-10-CM | POA: Diagnosis not present

## 2018-07-10 ENCOUNTER — Encounter: Payer: Self-pay | Admitting: Internal Medicine

## 2018-07-10 ENCOUNTER — Ambulatory Visit: Payer: BC Managed Care – PPO | Admitting: Internal Medicine

## 2018-07-10 ENCOUNTER — Other Ambulatory Visit: Payer: Self-pay

## 2018-07-10 VITALS — BP 134/81 | HR 74 | Temp 97.8°F | Ht 73.0 in | Wt 181.2 lb

## 2018-07-10 DIAGNOSIS — Z79899 Other long term (current) drug therapy: Secondary | ICD-10-CM

## 2018-07-10 DIAGNOSIS — E119 Type 2 diabetes mellitus without complications: Secondary | ICD-10-CM

## 2018-07-10 DIAGNOSIS — Z7984 Long term (current) use of oral hypoglycemic drugs: Secondary | ICD-10-CM

## 2018-07-10 DIAGNOSIS — I10 Essential (primary) hypertension: Secondary | ICD-10-CM

## 2018-07-10 DIAGNOSIS — K4090 Unilateral inguinal hernia, without obstruction or gangrene, not specified as recurrent: Secondary | ICD-10-CM | POA: Diagnosis not present

## 2018-07-10 LAB — POCT GLYCOSYLATED HEMOGLOBIN (HGB A1C): Hemoglobin A1C: 8 % — AB (ref 4.0–5.6)

## 2018-07-10 LAB — GLUCOSE, CAPILLARY: Glucose-Capillary: 109 mg/dL — ABNORMAL HIGH (ref 70–99)

## 2018-07-10 MED ORDER — ACCU-CHEK GUIDE VI STRP: ORAL_STRIP | 12 refills | Status: AC

## 2018-07-10 MED ORDER — JARDIANCE 10 MG PO TABS: 10.0000 mg | ORAL_TABLET | Freq: Every day | ORAL | 3 refills | Status: DC

## 2018-07-10 NOTE — Assessment & Plan Note (Signed)
Patient does not check his blood glucose at home. He does not follow diabetic diet, stating that while he is staying home he is eating a lot more carbs and a lot of Brisk as he does not want to drink water.  His A1c today was 8, increased from previous check of 6.8. Patient was unable to tolerate metformin in the past and is just taking Januvia.  We will add Jardiance 10 mg daily. Provided him with a glucometer and advised him to check his blood sugar daily and bring glucometer during next follow-up appointment. We had another discussion regarding low carbohydrate diet and avoid sugary drinks. He was provided with foot exam today. Patient he did have his eye exam done at a place in [place], could not remember the name but states that they were telling that everything is okay-no documentation.

## 2018-07-10 NOTE — Assessment & Plan Note (Signed)
BP Readings from Last 3 Encounters:  07/10/18 134/81  10/10/17 127/81  07/08/17 111/80   His blood pressure was within goal today. Denies any headache or dizziness.  -Continue current management with lisinopril 40 mg daily, amlodipine 10 mg daily, and HCTZ 25 mg daily.

## 2018-07-10 NOTE — Patient Instructions (Signed)
Thank you for visiting clinic today. As we discussed your diabetes becomes uncontrolled, as your A1c is an 8 today, we would like to see it below 7. I am adding Jardiance 10 mg daily. Please check your blood glucose 2-3 times a day and bring your glucometer with your next follow-up appointment in 1 month.  Empagliflozin oral tablets What is this medicine? EMPAGLIFLOZIN (EM pa gli FLOE zin) helps to treat type 2 diabetes. It helps to control blood sugar. This drug may also reduce the risk of heart attack or stroke if you have type 2 diabetes and risk factors for heart disease. Treatment is combined with diet and exercise. This medicine may be used for other purposes; ask your health care provider or pharmacist if you have questions. COMMON BRAND NAME(S): JARDIANCE What should I tell my health care provider before I take this medicine? They need to know if you have any of these conditions: -dehydration -diabetic ketoacidosis -diet low in salt -eating less due to illness, surgery, dieting, or any other reason -having surgery -high cholesterol -high levels of potassium in the blood -history of pancreatitis or pancreas problems -history of yeast infection of the penis or vagina -if you often drink alcohol -infections in the bladder, kidneys, or urinary tract -kidney disease -liver disease -low blood pressure -on hemodialysis -problems urinating -type 1 diabetes -uncircumcised male -an unusual or allergic reaction to empagliflozin, other medicines, foods, dyes, or preservatives -pregnant or trying to get pregnant -breast-feeding How should I use this medicine? Take this medicine by mouth with a glass of water. Follow the directions on the prescription label. Take it in the morning, with or without food. Take your dose at the same time each day. Do not take more often than directed. Do not stop taking except on your doctor's advice. Talk to your pediatrician regarding the use of this  medicine in children. Special care may be needed. Overdosage: If you think you have taken too much of this medicine contact a poison control center or emergency room at once. NOTE: This medicine is only for you. Do not share this medicine with others. What if I miss a dose? If you miss a dose, take it as soon as you can. If it is almost time for your next dose, take only that dose. Do not take double or extra doses. What may interact with this medicine? Do not take this medicine with any of the following medications: -gatifloxacin This medicine may also interact with the following medications: -alcohol -certain medicines for blood pressure, heart disease -diuretics This list may not describe all possible interactions. Give your health care provider a list of all the medicines, herbs, non-prescription drugs, or dietary supplements you use. Also tell them if you smoke, drink alcohol, or use illegal drugs. Some items may interact with your medicine. What should I watch for while using this medicine? Visit your doctor or health care professional for regular checks on your progress. This medicine can cause a serious condition in which there is too much acid in the blood. If you develop nausea, vomiting, stomach pain, unusual tiredness, or breathing problems, stop taking this medicine and call your doctor right away. If possible, use a ketone dipstick to check for ketones in your urine. A test called the HbA1C (A1C) will be monitored. This is a simple blood test. It measures your blood sugar control over the last 2 to 3 months. You will receive this test every 3 to 6 months. Learn how to check  your blood sugar. Learn the symptoms of low and high blood sugar and how to manage them. Always carry a quick-source of sugar with you in case you have symptoms of low blood sugar. Examples include hard sugar candy or glucose tablets. Make sure others know that you can choke if you eat or drink when you develop  serious symptoms of low blood sugar, such as seizures or unconsciousness. They must get medical help at once. Tell your doctor or health care professional if you have high blood sugar. You might need to change the dose of your medicine. If you are sick or exercising more than usual, you might need to change the dose of your medicine. Do not skip meals. Ask your doctor or health care professional if you should avoid alcohol. Many nonprescription cough and cold products contain sugar or alcohol. These can affect blood sugar. Wear a medical ID bracelet or chain, and carry a card that describes your disease and details of your medicine and dosage times. What side effects may I notice from receiving this medicine? Side effects that you should report to your doctor or health care professional as soon as possible: -allergic reactions like skin rash, itching or hives, swelling of the face, lips, or tongue -breathing problems -dizziness -feeling faint or lightheaded, falls -muscle weakness -nausea, vomiting, unusual stomach upset or pain -penile discharge, itching, or pain in men -signs and symptoms of a genital infection, such as fever; tenderness, redness, or swelling in the genitals or area from the genitals to the back of the rectum -signs and symptoms of low blood sugar such as feeling anxious, confusion, dizziness, increased hunger, unusually weak or tired, sweating, shakiness, cold, irritable, headache, blurred vision, fast heartbeat, loss of consciousness -signs and symptoms of a urinary tract infection, such as fever, chills, a burning feeling when urinating, blood in the urine, back pain -trouble passing urine or change in the amount of urine, including an urgent need to urinate more often, in larger amounts, or at night -unusual tiredness -vaginal discharge, itching, or odor in women Side effects that usually do not require medical attention (report to your doctor or health care professional if  they continue or are bothersome): -mild increase in urination -thirsty This list may not describe all possible side effects. Call your doctor for medical advice about side effects. You may report side effects to FDA at 1-800-FDA-1088. Where should I keep my medicine? Keep out of the reach of children. Store at room temperature between 20 and 25 degrees C (68 and 77 degrees F). Throw away any unused medicine after the expiration date. NOTE: This sheet is a summary. It may not cover all possible information. If you have questions about this medicine, talk to your doctor, pharmacist, or health care provider.  2019 Elsevier/Gold Standard (2016-09-23 10:25:34)

## 2018-07-10 NOTE — Progress Notes (Signed)
   CC: For follow-up of diabetes and hypertension.  HPI:  [name] is a 63 y.o. past medical history as listed below came to the clinic for follow-up of his diabetes and hypertension.  He was complaining of occasionally getting intermittent periumbilical discomfort.  Denies any associated nausea, vomiting or diarrhea.  Appetite is normal.  No pain or discomfort today.  Please see assessment and plan for his chronic conditions.  Past Medical History:  Diagnosis Date  . CVA (cerebral vascular accident) ([place]) 12/2015  . Diabetes ([place]) 2017  . Hyperlipidemia   . Hypertension    Review of Systems: Negative except mentioned in HPI.  Physical Exam:  Vitals:   07/10/18 1446  BP: 134/81  Pulse: 74  Temp: 97.8 F (36.6 C)  TempSrc: Oral  SpO2: 100%  Weight: 181 lb 3.2 oz (82.2 kg)   General: Vital signs reviewed.  Patient is well-developed and well-nourished, in no acute distress and cooperative with exam.  Head: Normocephalic and atraumatic. Eyes: EOMI, conjunctivae normal, no scleral icterus.  Cardiovascular: RRR, S1 normal, S2 normal, no murmurs, gallops, or rubs. Pulmonary/Chest: Clear to auscultation bilaterally, no wheezes, rales, or rhonchi. Abdominal: Soft, non-tender, non-distended, BS +, Extremities: No lower extremity edema bilaterally,  pulses symmetric and intact bilaterally. No cyanosis or clubbing. Skin: Warm, dry and intact. No rashes or erythema. Psychiatric: Normal mood and affect. speech and behavior is normal. Cognition and memory are normal.  Assessment & Plan:   See Encounters Tab for problem based charting.  Patient discussed with Dr. Angelia Mould.

## 2018-07-10 NOTE — Assessment & Plan Note (Signed)
Continue to experience right inguinal hernia with some exertion and coughing. He was unable to keep his appointment with surgery and then it was postponed due to COVID-19.  -Advised him to call his surgeon again to make an follow-up appointment.

## 2018-07-11 NOTE — Progress Notes (Signed)
Internal Medicine Clinic Attending  Case discussed with Dr. Amin at the time of the visit.  We reviewed the resident's history and exam and pertinent patient test results.  I agree with the assessment, diagnosis, and plan of care documented in the resident's note.    

## 2018-07-21 ENCOUNTER — Ambulatory Visit: Payer: Self-pay | Admitting: Surgery

## 2018-07-21 DIAGNOSIS — K4030 Unilateral inguinal hernia, with obstruction, without gangrene, not specified as recurrent: Secondary | ICD-10-CM | POA: Diagnosis not present

## 2018-07-21 NOTE — H&P (Signed)
History of Present Illness Seth [name]. Seth Yerby MD; 07/21/2018 11:07 AM) The patient is a 63 year old male who presents with an inguinal hernia. Referred by Dr. Lorella Nimrod for right inguinal hernia  This is a 63 year old male who presents with over a year history of an enlarging hernia in his right groin. He was referred to [place] originally last year but never came to see [place] because of scheduling issues. The hernia has enlarged significantly. There is no longer reducible. He is regular with his bowel movements and denies any obstructive symptoms. He has not had any imaging of this area. He denies any pain or swelling on the left side.     Problem List/Past Medical Seth Key K. Alane Hanssen, MD; 07/21/2018 11:07 AM) Seth [name] RIGHT INGUINAL HERNIA (K40.30)  Diagnostic Studies History Seth [name], CMA; 07/21/2018 10:34 AM) Colonoscopy within last year  Allergies Seth [name], [place]; 07/21/2018 10:34 AM) No Known Drug Allergies [07/21/2018]: Allergies Reconciled  Medication History Seth [name], CMA; 07/21/2018 10:35 AM) Vania Rea (10MG  Tablet, Oral) Active. amLODIPine Besylate (10MG  Tablet, Oral) Active. Aspirin (81MG  Tablet DR, Oral) Active. Atorvastatin Calcium (80MG  Tablet, Oral) Active. hydroCHLOROthiazide (25MG  Tablet, Oral) Active. Lisinopril (40MG  Tablet, Oral) Active. Januvia (100MG  Tablet, Oral) Active. Tadalafil (10MG  Tablet, Oral) Active. Medications Reconciled  Social History Seth [name], [place]; 07/21/2018 10:34 AM) Alcohol use Occasional alcohol use. Caffeine use Coffee. Illicit drug use Remotely quit drug use. Tobacco use Current some day smoker.  Family History Seth [name], [place]; 07/21/2018 10:34 AM) Alcohol Abuse Father. Diabetes Mellitus Mother. Hypertension Brother, Father, Mother, Sister. Respiratory Condition Father.  Other Problems Seth [name]. Seth Crader, MD; 07/21/2018 11:07 AM) High blood pressure Sickle cell  disease     Review of Systems Seth [name] CMA; 07/21/2018 10:34 AM) General Not Present- Appetite Loss, Chills, Fatigue, Fever, Night Sweats, Weight Gain and Weight Loss. Skin Not Present- Change in Wart/Mole, Dryness, Hives, Jaundice, New Lesions, Non-Healing Wounds, Rash and Ulcer. HEENT Present- Wears glasses/contact lenses. Not Present- Earache, Hearing Loss, Hoarseness, Nose Bleed, Oral Ulcers, Ringing in the Ears, Seasonal Allergies, Sinus Pain, Sore Throat, Visual Disturbances and Yellow Eyes. Respiratory Not Present- Bloody sputum, Chronic Cough, Difficulty Breathing, Snoring and Wheezing. Breast Not Present- Breast Mass, Breast Pain, Nipple Discharge and Skin Changes. Cardiovascular Not Present- Chest Pain, Difficulty Breathing Lying Down, Leg Cramps, Palpitations, Rapid Heart Rate, Shortness of Breath and Swelling of Extremities. Gastrointestinal Present- Abdominal Pain. Not Present- Bloating, Bloody Stool, Change in Bowel Habits, Chronic diarrhea, Constipation, Difficulty Swallowing, Excessive gas, Gets full quickly at meals, Hemorrhoids, Indigestion, Nausea, Rectal Pain and Vomiting. Male Genitourinary Not Present- Blood in Urine, Change in Urinary Stream, Frequency, Impotence, Nocturia, Painful Urination, Urgency and Urine Leakage. Musculoskeletal Not Present- Back Pain, Joint Pain, Joint Stiffness, Muscle Pain, Muscle Weakness and Swelling of Extremities. Neurological Not Present- Decreased Memory, Fainting, Headaches, Numbness, Seizures, Tingling, Tremor, Trouble walking and Weakness. Psychiatric Not Present- Anxiety, Bipolar, Change in Sleep Pattern, Depression, Fearful and Frequent crying. Endocrine Not Present- Cold Intolerance, Excessive Hunger, Hair Changes, Heat Intolerance, Hot flashes and New Diabetes. Hematology Not Present- Blood Thinners, Easy Bruising, Excessive bleeding, Gland problems, HIV and Persistent Infections.  Vitals Seth [name] CMA; 07/21/2018 10:34  AM) 07/21/2018 10:34 AM Weight: 179.8 lb Height: 73in Body Surface Area: 2.06 m Body Mass Index: 23.72 kg/m  Temp.: 45F  Pulse: 84 (Regular)  BP: 136/80 (Sitting, Left Arm, Standard)        Physical Exam Seth Key K. Aleena Kirkeby MD; 07/21/2018 11:07 AM)  The physical exam findings are as follows:  Note:WDWN in NAD Eyes: Pupils equal, round; sclera anicteric HENT: Oral mucosa moist; good dentition Neck: No masses palpated, no thyromegaly Lungs: CTA bilaterally; normal respiratory effort CV: Regular rate and rhythm; no murmurs; extremities well-perfused with no edema Abd: +bowel sounds, soft, non-tender, no palpable organomegaly; no palpable hernias Bilateral descended testes, no testicular masses, massive right inguinal hernia extending down towards the scrotum. No sign of left inguinal hernia. The right inguinal hernia is not reducible. It is minimally tender. Skin: Warm, dry; no sign of jaundice Psychiatric - alert and oriented x 4; calm mood and affect    Assessment & Plan Seth Key K. Cathan Gearin MD; 07/21/2018 11:08 AM)  INCARCERATED RIGHT INGUINAL HERNIA (K40.30)  Current Plans Schedule for Surgery - Right inguinal hernia repair with mesh. The surgical procedure has been discussed with the patient. Potential risks, benefits, alternative treatments, and expected outcomes have been explained. All of the patient's questions at this time have been answered. The likelihood of reaching the patient's treatment goal is good. The patient understand the proposed surgical procedure and wishes to proceed. Note:I explained to the patient that because of the massive size of the inguinal hernia, he will likely have some residual dead space and probable seroma after surgery.  Seth [name]. Georgette Dover, MD, [facility]  General/ Trauma Surgery Beeper [phone]  07/21/2018 11:08 AM \

## 2018-07-24 ENCOUNTER — Encounter: Payer: Self-pay | Admitting: *Deleted

## 2018-08-25 ENCOUNTER — Other Ambulatory Visit: Payer: Self-pay | Admitting: *Deleted

## 2018-08-25 DIAGNOSIS — E119 Type 2 diabetes mellitus without complications: Secondary | ICD-10-CM

## 2018-08-25 DIAGNOSIS — I10 Essential (primary) hypertension: Secondary | ICD-10-CM

## 2018-08-30 NOTE — Pre-Procedure Instructions (Addendum)
Malyk Girouard Lansing  08/30/2018      [id number] [address], [place] [address] Phone: [phone] Fax: [phone]    Your procedure is scheduled on September 13, 2018.  Report to [facility] Admitting at 800 AM.  Call this number if you have problems the morning of surgery:  [phone]   Remember:  Do not eat or drink after midnight.  You may drink clear liquids until 700 AM.  Clear liquids allowed are:  Water, Juice (non-citric and without pulp), Clear Tea, Black Coffee only and Gatorade  Please complete your Gatorade (G2) that was provided to you by 700 AM the morning of surgery.  Please, if able, drink it in one setting. DO NOT SIP.    Take these medicines the morning of surgery with A SIP OF WATER  Amlodipine (Norvasc)  Follow your surgeon's instructions on when to hold/resume aspirin.  If no instructions were given call the office to determine how they would like to you take aspirin  7 days prior to surgery STOP taking any Aleve, Naproxen, Ibuprofen, Motrin, Advil, Goody's, BC's, all herbal medications, fish oil, and all vitamins  WHAT DO I DO ABOUT MY DIABETES MEDICATION?   Marland Kitchen Do not take oral diabetes medicines (pills) the morning of surgery- empagliflozin (Jardiance) or Januvia (Sitagliptin).      THE DAY BEFORE SURGERY, DO NOT take Empagliflozine Vania Rea)  Reviewed and Endorsed by [facility] Patient Education Committee, August 2015  How to Manage Your Diabetes Before and After Surgery  Why is it important to control my blood sugar before and after surgery? . Improving blood sugar levels before and after surgery helps healing and can limit problems. . A way of improving blood sugar control is eating a healthy diet by: o  Eating less sugar and carbohydrates o  Increasing activity/exercise o  Talking with your doctor about reaching your blood sugar  goals . High blood sugars (greater than 180 mg/dL) can raise your risk of infections and slow your recovery, so you will need to focus on controlling your diabetes during the weeks before surgery. . Make sure that the doctor who takes care of your diabetes knows about your planned surgery including the date and location.  How do I manage my blood sugar before surgery? . Check your blood sugar at least 4 times a day, starting 2 days before surgery, to make sure that the level is not too high or low. o Check your blood sugar the morning of your surgery when you wake up and every 2 hours until you get to the Short Stay unit. . If your blood sugar is less than 70 mg/dL, you will need to treat for low blood sugar: o Do not take insulin. o Treat a low blood sugar (less than 70 mg/dL) with  cup of clear juice (cranberry or apple), 4 glucose tablets, OR glucose gel. Recheck blood sugar in 15 minutes after treatment (to make sure it is greater than 70 mg/dL). If your blood sugar is not greater than 70 mg/dL on recheck, call [phone] o  for further instructions. . Report your blood sugar to the short stay nurse when you get to Short Stay.  . If you are admitted to the hospital after surgery: o Your blood sugar will be checked by the staff and you will probably be given insulin after surgery (instead of oral diabetes medicines)  to make sure you have good blood sugar levels. o The goal for blood sugar control after surgery is 80-180 mg/dL.   [place]- Preparing For Surgery  Before surgery, you can play an important role. Because skin is not sterile, your skin needs to be as free of germs as possible. You can reduce the number of germs on your skin by washing with CHG (chlorahexidine gluconate) Soap before surgery.  CHG is an antiseptic cleaner which kills germs and bonds with the skin to continue killing germs even after washing.    Oral Hygiene is also important to reduce your risk of infection.   Remember - BRUSH YOUR TEETH THE MORNING OF SURGERY WITH YOUR REGULAR TOOTHPASTE  Please do not use if you have an allergy to CHG or antibacterial soaps. If your skin becomes reddened/irritated stop using the CHG.  Do not shave (including legs and underarms) for at least 48 hours prior to first CHG shower. It is OK to shave your face.  Please follow these instructions carefully.   1. Shower the NIGHT BEFORE SURGERY and the MORNING OF SURGERY with CHG.   2. If you chose to wash your hair, wash your hair first as usual with your normal shampoo.  3. After you shampoo, rinse your hair and body thoroughly to remove the shampoo.  4. Use CHG as you would any other liquid soap. You can apply CHG directly to the skin and wash gently with a scrungie or a clean washcloth.   5. Apply the CHG Soap to your body ONLY FROM THE NECK DOWN.  Do not use on open wounds or open sores. Avoid contact with your eyes, ears, mouth and genitals (private parts). Wash Face and genitals (private parts)  with your normal soap.  6. Wash thoroughly, paying special attention to the area where your surgery will be performed.  7. Thoroughly rinse your body with warm water from the neck down.  8. DO NOT shower/wash with your normal soap after using and rinsing off the CHG Soap.  9. Pat yourself dry with a CLEAN TOWEL.  10. Wear CLEAN PAJAMAS to bed the night before surgery, wear comfortable clothes the morning of surgery  11. Place CLEAN SHEETS on your bed the night of your first shower and DO NOT SLEEP WITH PETS.  Day of Surgery: Shower as above Do not apply any deodorants/lotions.  Please wear clean clothes to the hospital/surgery center.   Remember to brush your teeth WITH YOUR REGULAR TOOTHPASTE.    Do not wear jewelry  Do not wear lotions, powders, or cologs, or deodorant.  Men may shave face and neck.  Do not bring valuables to the hospital.  [facility] is not responsible for any belongings or  valuables.  Contacts, dentures or bridgework may not be worn into surgery.  Leave your suitcase in the car.  After surgery it may be brought to your room.  For patients admitted to the hospital, discharge time will be determined by your treatment team.  Patients discharged the day of surgery will not be allowed to drive home.   Please read over the following fact sheets that you were given.

## 2018-08-31 ENCOUNTER — Encounter (HOSPITAL_COMMUNITY)
Admission: RE | Admit: 2018-08-31 | Discharge: 2018-08-31 | Disposition: A | Discharge status: Home / Self Care | Payer: BC Managed Care – PPO | Source: Ambulatory Visit | Attending: Surgery | Admitting: Surgery

## 2018-08-31 ENCOUNTER — Other Ambulatory Visit: Payer: Self-pay

## 2018-08-31 ENCOUNTER — Encounter (HOSPITAL_COMMUNITY): Payer: Self-pay

## 2018-08-31 DIAGNOSIS — I498 Other specified cardiac arrhythmias: Secondary | ICD-10-CM | POA: Diagnosis not present

## 2018-08-31 DIAGNOSIS — Z7984 Long term (current) use of oral hypoglycemic drugs: Secondary | ICD-10-CM | POA: Insufficient documentation

## 2018-08-31 DIAGNOSIS — Z79899 Other long term (current) drug therapy: Secondary | ICD-10-CM | POA: Diagnosis not present

## 2018-08-31 DIAGNOSIS — Z8673 Personal history of transient ischemic attack (TIA), and cerebral infarction without residual deficits: Secondary | ICD-10-CM | POA: Insufficient documentation

## 2018-08-31 DIAGNOSIS — E785 Hyperlipidemia, unspecified: Secondary | ICD-10-CM | POA: Diagnosis not present

## 2018-08-31 DIAGNOSIS — Z01818 Encounter for other preprocedural examination: Secondary | ICD-10-CM | POA: Insufficient documentation

## 2018-08-31 DIAGNOSIS — E119 Type 2 diabetes mellitus without complications: Secondary | ICD-10-CM | POA: Insufficient documentation

## 2018-08-31 DIAGNOSIS — Z87891 Personal history of nicotine dependence: Secondary | ICD-10-CM | POA: Diagnosis not present

## 2018-08-31 DIAGNOSIS — K4030 Unilateral inguinal hernia, with obstruction, without gangrene, not specified as recurrent: Secondary | ICD-10-CM | POA: Diagnosis not present

## 2018-08-31 DIAGNOSIS — I10 Essential (primary) hypertension: Secondary | ICD-10-CM | POA: Insufficient documentation

## 2018-08-31 DIAGNOSIS — Z7982 Long term (current) use of aspirin: Secondary | ICD-10-CM | POA: Diagnosis not present

## 2018-08-31 LAB — BASIC METABOLIC PANEL
Anion gap: 10 (ref 5–15)
BUN: 15 mg/dL (ref 8–23)
CO2: 25 mmol/L (ref 22–32)
Calcium: 9.1 mg/dL (ref 8.9–10.3)
Chloride: 102 mmol/L (ref 98–111)
Creatinine, Ser: 1.29 mg/dL — ABNORMAL HIGH (ref 0.61–1.24)
GFR calc Af Amer: >60 mL/min (ref >60)
GFR calc non Af Amer: 59 mL/min — ABNORMAL LOW (ref >60)
Glucose, Bld: 90 mg/dL (ref 70–99)
Potassium: 3.4 mmol/L — ABNORMAL LOW (ref 3.5–5.1)
Sodium: 137 mmol/L (ref 135–145)

## 2018-08-31 LAB — CBC
HCT: 41.1 % (ref 39.0–52.0)
Hemoglobin: 13.7 g/dL (ref 13.0–17.0)
MCH: 28.6 pg (ref 26.0–34.0)
MCHC: 33.3 g/dL (ref 30.0–36.0)
MCV: 85.8 fL (ref 80.0–100.0)
Platelets: 289 10*3/uL (ref 150–400)
RBC: 4.79 MIL/uL (ref 4.22–5.81)
RDW: 13.5 % (ref 11.5–15.5)
WBC: 10.3 10*3/uL (ref 4.0–10.5)
nRBC: 0 % (ref 0.0–0.2)

## 2018-08-31 LAB — GLUCOSE, CAPILLARY: Glucose-Capillary: 84 mg/dL (ref 70–99)

## 2018-08-31 NOTE — Progress Notes (Addendum)
PCP - Earlene Plater, MD Cardiologist -  Pt denies Chest x-ray - pt denies EKG - 08/31/2018 at PAT appt  Stress Test - pt denies ECHO -  01/08/16 in EPIC  Cardiac Cath - pt denies  Sleep Study - pt denies CPAP - n/a  Fasting Blood Sugar - 100-120 Checks Blood Sugar 2 times a day  Blood Thinner Instructions: n/a Aspirin Instructions: Aspirin 81 mg- hold beginning 09/06/2018 per MD  Anesthesia review: pending labs, EKG  Patient denies shortness of breath, fever, cough and chest pain at PAT appointment  Patient verbalized understanding of instructions that were given to them at the PAT appointment. Patient was also instructed that they will need to review over the PAT instructions again at home before surgery Coronavirus Screening  Have you experienced the following symptoms:  Cough yes/no: No Fever (>100.40F)  yes/no: No Runny nose yes/no: No Sore throat yes/no: No Difficulty breathing/shortness of breath  yes/no: No  Have you or a family member traveled in the last 14 days and where? yes/no: No   If the patient indicates "YES" to the above questions, their PAT will be rescheduled to limit the exposure to others and, the surgeon will be notified. THE PATIENT WILL NEED TO BE ASYMPTOMATIC FOR 14 DAYS.   If the patient is not experiencing any of these symptoms, the PAT nurse will instruct them to NOT bring anyone with them to their appointment since they may have these symptoms or traveled as well.   Please remind your patients and families that hospital visitation restrictions are in effect and the importance of the restrictions.

## 2018-09-01 MED ORDER — ATORVASTATIN CALCIUM 80 MG PO TABS: 80.0000 mg | ORAL_TABLET | Freq: Every day | ORAL | 3 refills | Status: DC

## 2018-09-01 NOTE — Telephone Encounter (Signed)
Next appt scheduled 8/24 with PCP. 

## 2018-09-01 NOTE — Progress Notes (Signed)
Anesthesia Chart Review:  Case: [id number] Date/Time: 09/13/18 0945   Procedure: RIGHT INGUINAL HERNIA REPAIR WITH MESH (Right ) - GENERAL AND TAP BLOCK   Anesthesia type: General   Pre-op diagnosis: INCARCERATED RIGHT INGUINAL HERNIA   Location: [facility] OR ROOM 02 / [facility]   Surgeon: Donnie Mesa, MD      DISCUSSION: Patient is a 63 year old male scheduled for the above procedure.  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  Reports last ASA scheduled for 09/06/18.  BP appears well controlled now. Recent A1c 8.0%, but new medication added. Fasting CBGs reported as ~ 100-120. Patient denied shortness of breath, fever, cough and chest pain at PAT RN visit. Recent evaluation by PCP with referral to general surgery. Pre-surgical COVID-19 test is scheduled for 09/09/18. If negative and otherwise no acute changes then I would anticipate that he could proceed as planned.    VS: BP 121/80   Pulse 86   Temp (!) 36.2 C   Resp 20   Ht 6' 1.5" (1.867 m)   Wt 78.9 kg   SpO2 98%   BMI 22.63 kg/m    PROVIDERS: Earlene Plater, MD is PCP (to get established on 09/18/18 at the [facility]). Most recently saw Lorella Nimrod, MD on 07/10/18.  Jardiance added due to A1c of 8.  Glucometer also provided for him to monitor CBGs at home. He was referred back to [facility] for consideration of right IHR.   LABS: Labs reviewed: Acceptable for surgery. A1c 07/10/18 8.0 (PCP added medication). Currently reports home fasting CBGs 100-120. (all labs ordered are listed, but only abnormal results are displayed)  Labs Reviewed  BASIC METABOLIC PANEL - Abnormal; Notable for the following components:      Result Value   Potassium 3.4 (*)    Creatinine, Ser 1.29 (*)    GFR calc non Af Amer 59 (*)    All other components within normal limits  GLUCOSE, CAPILLARY  CBC     EKG:  08/31/18: Normal sinus rhythm with sinus arrhythmia Moderate voltage criteria for LVH, may be normal variant Borderline ECG Since last tracing (01/07/16) negative T waves in the inferolateral leads have resolved Confirmed by Ena Dawley [phone]) on 08/31/2018 9:15:28 PM   CV: Echo 01/08/16 (as part of CVA work-up): Study Conclusions - Left ventricle: The cavity size was normal. Wall thickness was   increased in a pattern of moderate to severe LVH. Systolic   function was normal. The estimated ejection fraction was in the   range of 50% to 55%. Wall motion was normal; there were no   regional wall motion abnormalities. Doppler parameters are   consistent with abnormal left ventricular relaxation (grade 1   diastolic dysfunction). - Left atrium: The atrium was mildly dilated. Impressions: - No cardiac source of emboli was indentified.   MRA neck 01/07/16 (in setting of acute CVA): FINDINGS Both carotid systems are normal. There is no dissection or hemodynamically significant stenosis. The right vertebral artery is normal in course and caliber. The left vertebral artery is diminutive with reduced contrast enhancement and near complete lack of enhancement of the V3 and V4 segments. Visualized aortic arch and proximal subclavian arteries are normal. There is a normal 3 vessel arch branching pattern.   Past Medical History:  Diagnosis Date  . CVA (cerebral vascular accident) ([place]) 12/2015  . Diabetes ([place])  2017  . Hyperlipidemia   . Hypertension     Past Surgical History:  Procedure Laterality Date  . COLONOSCOPY  2019  . CYST REMOVAL LEG      MEDICATIONS: . amLODipine (NORVASC) 10 MG tablet  . aspirin 81 MG EC tablet  . atorvastatin (LIPITOR) 80 MG tablet  . empagliflozin (JARDIANCE) 10 MG TABS tablet  . glucose blood (ACCU-CHEK GUIDE) test strip  . hydrochlorothiazide (HYDRODIURIL) 25 MG tablet  . lisinopril (PRINIVIL,ZESTRIL) 40 MG tablet  . sitaGLIPtin (JANUVIA)  100 MG tablet  . sitaGLIPtin (JANUVIA) 50 MG tablet  . tadalafil (CIALIS) 10 MG tablet   . 0.9 %  sodium chloride infusion     Myra Gianotti, PA-C Surgical Short Stay/Anesthesiology [facility] Phone [phone] [facility] Phone [phone] 09/01/2018 2:48 PM

## 2018-09-01 NOTE — Anesthesia Preprocedure Evaluation (Addendum)
Anesthesia Evaluation  Patient identified by MRN, date of birth, ID band Patient awake    Reviewed: Allergy & Precautions, NPO status , Patient's Chart, lab work & pertinent test results  History of Anesthesia Complications Negative for: history of anesthetic complications  Airway Mallampati: II  TM Distance: >3 FB Neck ROM: Full    Dental  (+) Chipped, Dental Advisory Given   Pulmonary Current Smoker and Patient abstained from smoking.,  09/09/2018 SARS coronavirus NEG   breath sounds clear to auscultation       Cardiovascular hypertension, Pt. on medications (-) angina Rhythm:Regular Rate:Normal  '17 ECHO: EF 50-55%, valves OK   Neuro/Psych negative neurological ROS     GI/Hepatic negative GI ROS, Neg liver ROS,   Endo/Other  diabetes (glu 109), Oral Hypoglycemic Agents  Renal/GU Renal InsufficiencyRenal disease (creat 1.29)     Musculoskeletal   Abdominal   Peds  Hematology  (+) Sickle cell trait ,   Anesthesia Other Findings   Reproductive/Obstetrics                           Anesthesia Physical Anesthesia Plan  ASA: II  Anesthesia Plan: General   Post-op Pain Management: GA combined w/ Regional for post-op pain   Induction:   PONV Risk Score and Plan: 2 and Ondansetron and Dexamethasone  Airway Management Planned: Oral ETT  Additional Equipment:   Intra-op Plan:   Post-operative Plan: Extubation in OR  Informed Consent: I have reviewed the patients History and Physical, chart, labs and discussed the procedure including the risks, benefits and alternatives for the proposed anesthesia with the patient or authorized representative who has indicated his/her understanding and acceptance.     Dental advisory given  Plan Discussed with: CRNA and Surgeon  Anesthesia Plan Comments: (PAT note written 09/01/2018 by Myra Gianotti, PA-C. Plan routine monitors, GETA with TAP  block for post op analgesia )      Anesthesia Quick Evaluation

## 2018-09-09 ENCOUNTER — Other Ambulatory Visit (HOSPITAL_COMMUNITY)
Admission: RE | Admit: 2018-09-09 | Discharge: 2018-09-09 | Disposition: A | Discharge status: Home / Self Care | Payer: BC Managed Care – PPO | Source: Ambulatory Visit | Attending: Surgery | Admitting: Surgery

## 2018-09-09 DIAGNOSIS — Z20828 Contact with and (suspected) exposure to other viral communicable diseases: Secondary | ICD-10-CM | POA: Diagnosis not present

## 2018-09-09 DIAGNOSIS — Z01812 Encounter for preprocedural laboratory examination: Secondary | ICD-10-CM | POA: Diagnosis not present

## 2018-09-09 LAB — SARS CORONAVIRUS 2 (TAT 6-24 HRS): SARS Coronavirus 2: NEGATIVE

## 2018-09-13 ENCOUNTER — Other Ambulatory Visit: Payer: Self-pay

## 2018-09-13 ENCOUNTER — Ambulatory Visit (HOSPITAL_COMMUNITY)
Admission: RE | Admit: 2018-09-13 | Discharge: 2018-09-13 | Disposition: A | Discharge status: Home / Self Care | Payer: BC Managed Care – PPO | Attending: Surgery | Admitting: Surgery

## 2018-09-13 ENCOUNTER — Ambulatory Visit (HOSPITAL_COMMUNITY): Payer: BC Managed Care – PPO | Admitting: Certified Registered"

## 2018-09-13 ENCOUNTER — Encounter (HOSPITAL_COMMUNITY): Payer: Self-pay

## 2018-09-13 ENCOUNTER — Encounter (HOSPITAL_COMMUNITY)
Admission: RE | Disposition: A | Discharge status: Home / Self Care | Payer: Self-pay | Source: Home / Self Care | Attending: Surgery

## 2018-09-13 ENCOUNTER — Ambulatory Visit (HOSPITAL_COMMUNITY): Payer: BC Managed Care – PPO | Admitting: Vascular Surgery

## 2018-09-13 DIAGNOSIS — D571 Sickle-cell disease without crisis: Secondary | ICD-10-CM | POA: Insufficient documentation

## 2018-09-13 DIAGNOSIS — Z8249 Family history of ischemic heart disease and other diseases of the circulatory system: Secondary | ICD-10-CM | POA: Diagnosis not present

## 2018-09-13 DIAGNOSIS — E119 Type 2 diabetes mellitus without complications: Secondary | ICD-10-CM | POA: Diagnosis not present

## 2018-09-13 DIAGNOSIS — Z7982 Long term (current) use of aspirin: Secondary | ICD-10-CM | POA: Insufficient documentation

## 2018-09-13 DIAGNOSIS — I10 Essential (primary) hypertension: Secondary | ICD-10-CM | POA: Diagnosis not present

## 2018-09-13 DIAGNOSIS — F17200 Nicotine dependence, unspecified, uncomplicated: Secondary | ICD-10-CM | POA: Insufficient documentation

## 2018-09-13 DIAGNOSIS — Z833 Family history of diabetes mellitus: Secondary | ICD-10-CM | POA: Insufficient documentation

## 2018-09-13 DIAGNOSIS — Z79899 Other long term (current) drug therapy: Secondary | ICD-10-CM | POA: Diagnosis not present

## 2018-09-13 DIAGNOSIS — K4030 Unilateral inguinal hernia, with obstruction, without gangrene, not specified as recurrent: Secondary | ICD-10-CM | POA: Insufficient documentation

## 2018-09-13 DIAGNOSIS — G8918 Other acute postprocedural pain: Secondary | ICD-10-CM | POA: Diagnosis not present

## 2018-09-13 DIAGNOSIS — Z7984 Long term (current) use of oral hypoglycemic drugs: Secondary | ICD-10-CM | POA: Diagnosis not present

## 2018-09-13 DIAGNOSIS — K4090 Unilateral inguinal hernia, without obstruction or gangrene, not specified as recurrent: Secondary | ICD-10-CM | POA: Diagnosis not present

## 2018-09-13 HISTORY — PX: INGUINAL HERNIA REPAIR: SHX194

## 2018-09-13 LAB — GLUCOSE, CAPILLARY
Glucose-Capillary: 109 mg/dL — ABNORMAL HIGH (ref 70–99)
Glucose-Capillary: 155 mg/dL — ABNORMAL HIGH (ref 70–99)

## 2018-09-13 SURGERY — REPAIR, HERNIA, INGUINAL, ADULT
Anesthesia: General | Site: Inguinal | Laterality: Right

## 2018-09-13 MED ORDER — MIDAZOLAM HCL 2 MG/2ML IJ SOLN: 0.5000 mg | Freq: Once | INTRAMUSCULAR | Status: DC | PRN

## 2018-09-13 MED ORDER — FENTANYL CITRATE (PF) 250 MCG/5ML IJ SOLN
INTRAMUSCULAR | Status: AC
  Filled 2018-09-13: qty 5

## 2018-09-13 MED ORDER — PHENYLEPHRINE 40 MCG/ML (10ML) SYRINGE FOR IV PUSH (FOR BLOOD PRESSURE SUPPORT)
PREFILLED_SYRINGE | INTRAVENOUS | Status: AC
  Filled 2018-09-13: qty 10

## 2018-09-13 MED ORDER — GABAPENTIN 300 MG PO CAPS
300.0000 mg | ORAL_CAPSULE | ORAL | Status: AC
  Administered 2018-09-13: 09:00:00 300 mg via ORAL

## 2018-09-13 MED ORDER — CELECOXIB 200 MG PO CAPS
ORAL_CAPSULE | ORAL | Status: AC
  Administered 2018-09-13: 200 mg via ORAL
  Filled 2018-09-13: qty 1

## 2018-09-13 MED ORDER — 0.9 % SODIUM CHLORIDE (POUR BTL) OPTIME
TOPICAL | Status: DC | PRN
  Administered 2018-09-13: 11:00:00 1000 mL

## 2018-09-13 MED ORDER — MIDAZOLAM HCL 2 MG/2ML IJ SOLN
INTRAMUSCULAR | Status: AC
  Filled 2018-09-13: qty 2

## 2018-09-13 MED ORDER — CELECOXIB 200 MG PO CAPS
200.0000 mg | ORAL_CAPSULE | ORAL | Status: AC
  Administered 2018-09-13: 09:00:00 200 mg via ORAL

## 2018-09-13 MED ORDER — MIDAZOLAM HCL 2 MG/2ML IJ SOLN
INTRAMUSCULAR | Status: AC
  Administered 2018-09-13: 2 mg via INTRAVENOUS
  Filled 2018-09-13: qty 2

## 2018-09-13 MED ORDER — ONDANSETRON HCL 4 MG/2ML IJ SOLN
INTRAMUSCULAR | Status: AC
  Filled 2018-09-13: qty 2

## 2018-09-13 MED ORDER — ROCURONIUM BROMIDE 10 MG/ML (PF) SYRINGE
PREFILLED_SYRINGE | INTRAVENOUS | Status: AC
  Filled 2018-09-13: qty 10

## 2018-09-13 MED ORDER — PHENYLEPHRINE 40 MCG/ML (10ML) SYRINGE FOR IV PUSH (FOR BLOOD PRESSURE SUPPORT)
PREFILLED_SYRINGE | INTRAVENOUS | Status: DC | PRN
  Administered 2018-09-13: 80 ug via INTRAVENOUS
  Administered 2018-09-13: 120 ug via INTRAVENOUS
  Administered 2018-09-13: 80 ug via INTRAVENOUS
  Administered 2018-09-13: 120 ug via INTRAVENOUS

## 2018-09-13 MED ORDER — DEXAMETHASONE SODIUM PHOSPHATE 10 MG/ML IJ SOLN
INTRAMUSCULAR | Status: DC | PRN
  Administered 2018-09-13: 5 mg via INTRAVENOUS

## 2018-09-13 MED ORDER — FENTANYL CITRATE (PF) 100 MCG/2ML IJ SOLN: 25.0000 ug | INTRAMUSCULAR | Status: DC | PRN

## 2018-09-13 MED ORDER — GABAPENTIN 300 MG PO CAPS
ORAL_CAPSULE | ORAL | Status: AC
  Administered 2018-09-13: 300 mg via ORAL
  Filled 2018-09-13: qty 1

## 2018-09-13 MED ORDER — PROMETHAZINE HCL 25 MG/ML IJ SOLN: 6.2500 mg | INTRAMUSCULAR | Status: DC | PRN

## 2018-09-13 MED ORDER — CEFAZOLIN SODIUM-DEXTROSE 2-4 GM/100ML-% IV SOLN
2.0000 g | INTRAVENOUS | Status: AC
  Administered 2018-09-13: 2 g via INTRAVENOUS

## 2018-09-13 MED ORDER — ACETAMINOPHEN 500 MG PO TABS
ORAL_TABLET | ORAL | Status: AC
  Administered 2018-09-13: 1000 mg via ORAL
  Filled 2018-09-13: qty 2

## 2018-09-13 MED ORDER — OXYCODONE HCL 5 MG PO TABS: 5.0000 mg | ORAL_TABLET | Freq: Once | ORAL | Status: DC | PRN

## 2018-09-13 MED ORDER — FENTANYL CITRATE (PF) 100 MCG/2ML IJ SOLN
100.0000 ug | Freq: Once | INTRAMUSCULAR | Status: AC
  Administered 2018-09-13: 100 ug via INTRAVENOUS

## 2018-09-13 MED ORDER — OXYCODONE HCL 5 MG PO TABS: 5.0000 mg | ORAL_TABLET | Freq: Four times a day (QID) | ORAL | 0 refills | Status: DC | PRN

## 2018-09-13 MED ORDER — BUPIVACAINE-EPINEPHRINE (PF) 0.5% -1:200000 IJ SOLN
INTRAMUSCULAR | Status: DC | PRN
  Administered 2018-09-13: 30 mL

## 2018-09-13 MED ORDER — OXYCODONE HCL 5 MG/5ML PO SOLN: 5.0000 mg | Freq: Once | ORAL | Status: DC | PRN

## 2018-09-13 MED ORDER — MEPERIDINE HCL 25 MG/ML IJ SOLN: 6.2500 mg | INTRAMUSCULAR | Status: DC | PRN

## 2018-09-13 MED ORDER — ROCURONIUM BROMIDE 10 MG/ML (PF) SYRINGE
PREFILLED_SYRINGE | INTRAVENOUS | Status: DC | PRN
  Administered 2018-09-13: 60 mg via INTRAVENOUS

## 2018-09-13 MED ORDER — LIDOCAINE 2% (20 MG/ML) 5 ML SYRINGE
INTRAMUSCULAR | Status: DC | PRN
  Administered 2018-09-13: 40 mg via INTRAVENOUS

## 2018-09-13 MED ORDER — BUPIVACAINE-EPINEPHRINE 0.25% -1:200000 IJ SOLN
INTRAMUSCULAR | Status: DC | PRN
  Administered 2018-09-13: 10 mL

## 2018-09-13 MED ORDER — BUPIVACAINE-EPINEPHRINE (PF) 0.25% -1:200000 IJ SOLN
INTRAMUSCULAR | Status: AC
  Filled 2018-09-13: qty 30

## 2018-09-13 MED ORDER — FENTANYL CITRATE (PF) 100 MCG/2ML IJ SOLN
INTRAMUSCULAR | Status: AC
  Administered 2018-09-13: 100 ug via INTRAVENOUS
  Filled 2018-09-13: qty 2

## 2018-09-13 MED ORDER — MIDAZOLAM HCL 2 MG/2ML IJ SOLN
2.0000 mg | Freq: Once | INTRAMUSCULAR | Status: AC
  Administered 2018-09-13: 10:00:00 2 mg via INTRAVENOUS

## 2018-09-13 MED ORDER — PROPOFOL 10 MG/ML IV BOLUS
INTRAVENOUS | Status: DC | PRN
  Administered 2018-09-13: 110 mg via INTRAVENOUS
  Administered 2018-09-13: 90 mg via INTRAVENOUS

## 2018-09-13 MED ORDER — ONDANSETRON HCL 4 MG/2ML IJ SOLN
INTRAMUSCULAR | Status: DC | PRN
  Administered 2018-09-13: 4 mg via INTRAVENOUS

## 2018-09-13 MED ORDER — PROPOFOL 10 MG/ML IV BOLUS
INTRAVENOUS | Status: AC
  Filled 2018-09-13: qty 20

## 2018-09-13 MED ORDER — LIDOCAINE 2% (20 MG/ML) 5 ML SYRINGE
INTRAMUSCULAR | Status: AC
  Filled 2018-09-13: qty 5

## 2018-09-13 MED ORDER — CEFAZOLIN SODIUM-DEXTROSE 2-4 GM/100ML-% IV SOLN
INTRAVENOUS | Status: AC
  Filled 2018-09-13: qty 100

## 2018-09-13 MED ORDER — SUGAMMADEX SODIUM 200 MG/2ML IV SOLN
INTRAVENOUS | Status: DC | PRN
  Administered 2018-09-13: 200 mg via INTRAVENOUS

## 2018-09-13 MED ORDER — STERILE WATER FOR IRRIGATION IR SOLN
Status: DC | PRN
  Administered 2018-09-13: 1000 mL

## 2018-09-13 MED ORDER — LACTATED RINGERS IV SOLN
INTRAVENOUS | Status: DC
  Administered 2018-09-13: 09:00:00 via INTRAVENOUS

## 2018-09-13 MED ORDER — ACETAMINOPHEN 500 MG PO TABS
1000.0000 mg | ORAL_TABLET | ORAL | Status: AC
  Administered 2018-09-13: 09:00:00 1000 mg via ORAL

## 2018-09-13 MED ORDER — CHLORHEXIDINE GLUCONATE CLOTH 2 % EX PADS: 6.0000 | MEDICATED_PAD | Freq: Once | CUTANEOUS | Status: DC

## 2018-09-13 SURGICAL SUPPLY — 44 items
BENZOIN TINCTURE PRP APPL 2/3 (GAUZE/BANDAGES/DRESSINGS) ×2 IMPLANT
BLADE CLIPPER SURG (BLADE) IMPLANT
CHLORAPREP W/TINT 26 (MISCELLANEOUS) ×2 IMPLANT
CLOSURE STERI-STRIP 1/4X4 (GAUZE/BANDAGES/DRESSINGS) ×2 IMPLANT
COVER SURGICAL LIGHT HANDLE (MISCELLANEOUS) ×2 IMPLANT
COVER WAND RF STERILE (DRAPES) ×2 IMPLANT
DRAIN PENROSE 1/2X12 LTX STRL (WOUND CARE) IMPLANT
DRAPE LAPAROSCOPIC ABDOMINAL (DRAPES) IMPLANT
DRAPE LAPAROTOMY TRNSV 102X78 (DRAPES) ×2 IMPLANT
DRSG TEGADERM 2-3/8X2-3/4 SM (GAUZE/BANDAGES/DRESSINGS) ×2 IMPLANT
DRSG TEGADERM 4X4.75 (GAUZE/BANDAGES/DRESSINGS) ×2 IMPLANT
ELECT CAUTERY BLADE 6.4 (BLADE) IMPLANT
ELECT REM PT RETURN 9FT ADLT (ELECTROSURGICAL) ×2
ELECTRODE REM PT RTRN 9FT ADLT (ELECTROSURGICAL) ×1 IMPLANT
GAUZE 4X4 16PLY RFD (DISPOSABLE) ×2 IMPLANT
GAUZE SPONGE 4X4 12PLY STRL (GAUZE/BANDAGES/DRESSINGS) ×2 IMPLANT
GLOVE BIO SURGEON STRL SZ7 (GLOVE) ×2 IMPLANT
GLOVE BIOGEL PI IND STRL 7.5 (GLOVE) ×1 IMPLANT
GLOVE BIOGEL PI INDICATOR 7.5 (GLOVE) ×1
GOWN STRL REUS W/ TWL LRG LVL3 (GOWN DISPOSABLE) ×2 IMPLANT
GOWN STRL REUS W/TWL LRG LVL3 (GOWN DISPOSABLE) ×2
KIT BASIN OR (CUSTOM PROCEDURE TRAY) ×2 IMPLANT
KIT TURNOVER KIT B (KITS) ×2 IMPLANT
MESH PARIETEX PROGRIP RIGHT (Mesh General) ×2 IMPLANT
NEEDLE HYPO 25GX1X1/2 BEV (NEEDLE) ×2 IMPLANT
NS IRRIG 1000ML POUR BTL (IV SOLUTION) ×2 IMPLANT
PACK GENERAL/GYN (CUSTOM PROCEDURE TRAY) ×2 IMPLANT
PAD ARMBOARD 7.5X6 YLW CONV (MISCELLANEOUS) ×2 IMPLANT
PENCIL SMOKE EVACUATOR (MISCELLANEOUS) ×2 IMPLANT
SPONGE INTESTINAL PEANUT (DISPOSABLE) ×2 IMPLANT
STRIP CLOSURE SKIN 1/2X4 (GAUZE/BANDAGES/DRESSINGS) ×2 IMPLANT
SUT MNCRL AB 4-0 PS2 18 (SUTURE) ×2 IMPLANT
SUT PDS AB 0 CT 36 (SUTURE) IMPLANT
SUT SILK 2 0 SH (SUTURE) IMPLANT
SUT SILK 3 0 (SUTURE)
SUT SILK 3-0 18XBRD TIE 12 (SUTURE) IMPLANT
SUT VIC AB 0 CT2 27 (SUTURE) ×4 IMPLANT
SUT VIC AB 2-0 SH 27 (SUTURE) ×1
SUT VIC AB 2-0 SH 27X BRD (SUTURE) ×1 IMPLANT
SUT VIC AB 3-0 SH 27 (SUTURE) ×1
SUT VIC AB 3-0 SH 27XBRD (SUTURE) ×1 IMPLANT
SYR CONTROL 10ML LL (SYRINGE) ×2 IMPLANT
TOWEL GREEN STERILE (TOWEL DISPOSABLE) ×2 IMPLANT
TOWEL GREEN STERILE FF (TOWEL DISPOSABLE) ×2 IMPLANT

## 2018-09-13 NOTE — Anesthesia Postprocedure Evaluation (Signed)
Anesthesia Post Note  Patient: Seth [name]  Procedure(s) Performed: RIGHT INGUINAL HERNIA REPAIR WITH MESH (Right Inguinal)     Patient location during evaluation: PACU Anesthesia Type: General Level of consciousness: awake and alert, patient cooperative and oriented Pain management: pain level controlled Vital Signs Assessment: post-procedure vital signs reviewed and stable Respiratory status: spontaneous breathing, nonlabored ventilation and respiratory function stable Cardiovascular status: blood pressure returned to baseline and stable Postop Assessment: no apparent nausea or vomiting Anesthetic complications: no    Last Vitals:  Vitals:   09/13/18 1233 09/13/18 1235  BP: (!) 140/95 (!) 144/94  Pulse: 68 65  Resp: 17 15  Temp:    SpO2: 98% 100%    Last Pain:  Vitals:   09/13/18 1230  TempSrc:   PainSc: 2                  Leilanee Righetti,E. Frazier Balfour

## 2018-09-13 NOTE — Op Note (Signed)
Right inguinal hernia repair with mesh, Procedure Note  Indications: The patient presented with a history of a right, not reducible inguinal hernia.    Pre-operative Diagnosis: right not reducible inguinal hernia Post-operative Diagnosis: same  Surgeon: Maia Petties   Assistants: none  Anesthesia: General endotracheal anesthesia and TAP block  ASA Class: 2  Procedure Details  The patient was seen again in the Holding Room. The risks, benefits, complications, treatment options, and expected outcomes were discussed with the patient. The possibilities of reaction to medication, pulmonary aspiration, perforation of viscus, bleeding, recurrent infection, the need for additional procedures, and development of a complication requiring transfusion or further operation were discussed with the patient and/or family. The likelihood of success in repairing the hernia and returning the patient to their previous functional status is good.  There was concurrence with the proposed plan, and informed consent was obtained. The site of surgery was properly noted/marked. The patient was taken to the Operating Room, identified as Seth [name], and the procedure verified as right inguinal hernia repair. A Time Out was held and the above information confirmed.  The patient was placed in the supine position and underwent induction of anesthesia. The lower abdomen and groin was prepped with Chloraprep and draped in the standard fashion, and 0.25% Marcaine with epinephrine was used to anesthetize the skin over the mid-portion of the inguinal canal. An oblique incision was made. Dissection was carried down through the subcutaneous tissue with cautery to the external oblique fascia.  We opened the external oblique fascia along the direction of its fibers to the external ring.  There is a massive hernia protruding through the external ring down into the scrotum.  The spermatic cord was circumferentially dissected  bluntly and retracted with a Penrose drain.   The floor of the inguinal canal was inspected and was very lax.  We skeletonized the spermatic cord and reduced a very large indirect hernia sac.  The sac was opened and had a sliding component with the cecum in one wall.  The hernia sac was closed with Vicryl.  The internal ring was tightened with 0 Vicryl.  The floor of the canal was closed with 0 Vicryl.  We used a right Progrip mesh which was inserted and deployed across the floor of the inguinal canal. The mesh was tucked underneath the external oblique fascia laterally.  The flap of the mesh was closed around the spermatic cord to recreate the internal inguinal ring.  The mesh was secured to the pubic tubercle with 0 Vicryl.  Multiple additional stay sutures were used to secure the edges of the mesh.  The external oblique fascia was reapproximated with 2-0 Vicryl.  3-0 Vicryl was used to close the subcutaneous tissues and 4-0 Monocryl was used to close the skin in subcuticular fashion.  Benzoin and steri-strips were used to seal the incision.  A clean dressing was applied.  The patient was then extubated and brought to the recovery room in stable condition.  All sponge, instrument, and needle counts were correct prior to closure and at the conclusion of the case.   Estimated Blood Loss: Minimal                 Complications: None; patient tolerated the procedure well.         Disposition: PACU - hemodynamically stable.         Condition: stable  Seth [name]. Seth Dover, MD, [facility]  General/ Trauma Surgery Beeper [phone]  09/13/2018 11:58 AM

## 2018-09-13 NOTE — H&P (Signed)
History of Present Illness  The patient is a 63 year old male who presents with an inguinal hernia. Referred by Dr. Lorella Nimrod for right inguinal hernia  This is a 63 year old male who presents with over a year history of an enlarging hernia in his right groin. He was referred to [place] originally last year but never came to see [place] because of scheduling issues. The hernia has enlarged significantly. There is no longer reducible. He is regular with his bowel movements and denies any obstructive symptoms. He has not had any imaging of this area. He denies any pain or swelling on the left side.     Problem List/Past Medical  INCARCERATED RIGHT INGUINAL HERNIA (K40.30)  Diagnostic Studies History ( Colonoscopy within last year  Allergies  No Known Drug Allergies  Allergies Reconciled  Medication History Jardiance (10MG  Tablet, Oral) Active. amLODIPine Besylate (10MG  Tablet, Oral) Active. Aspirin (81MG  Tablet DR, Oral) Active. Atorvastatin Calcium (80MG  Tablet, Oral) Active. hydroCHLOROthiazide (25MG  Tablet, Oral) Active. Lisinopril (40MG  Tablet, Oral) Active. Januvia (100MG  Tablet, Oral) Active. Tadalafil (10MG  Tablet, Oral) Active. Medications Reconciled  Social History Alcohol use Occasional alcohol use. Caffeine use Coffee. Illicit drug use Remotely quit drug use. Tobacco use Current some day smoker.  Family History  Alcohol Abuse Father. Diabetes Mellitus Mother. Hypertension Brother, Father, Mother, Sister. Respiratory Condition Father.  Other Problems High blood pressure Sickle cell disease     Review of Systems General Not Present- Appetite Loss, Chills, Fatigue, Fever, Night Sweats, Weight Gain and Weight Loss. Skin Not Present- Change in Wart/Mole, Dryness, Hives, Jaundice, New Lesions, Non-Healing Wounds, Rash and Ulcer. HEENT Present- Wears glasses/contact lenses. Not Present- Earache, Hearing Loss, Hoarseness, Nose  Bleed, Oral Ulcers, Ringing in the Ears, Seasonal Allergies, Sinus Pain, Sore Throat, Visual Disturbances and Yellow Eyes. Respiratory Not Present- Bloody sputum, Chronic Cough, Difficulty Breathing, Snoring and Wheezing. Breast Not Present- Breast Mass, Breast Pain, Nipple Discharge and Skin Changes. Cardiovascular Not Present- Chest Pain, Difficulty Breathing Lying Down, Leg Cramps, Palpitations, Rapid Heart Rate, Shortness of Breath and Swelling of Extremities. Gastrointestinal Present- Abdominal Pain. Not Present- Bloating, Bloody Stool, Change in Bowel Habits, Chronic diarrhea, Constipation, Difficulty Swallowing, Excessive gas, Gets full quickly at meals, Hemorrhoids, Indigestion, Nausea, Rectal Pain and Vomiting. Male Genitourinary Not Present- Blood in Urine, Change in Urinary Stream, Frequency, Impotence, Nocturia, Painful Urination, Urgency and Urine Leakage. Musculoskeletal Not Present- Back Pain, Joint Pain, Joint Stiffness, Muscle Pain, Muscle Weakness and Swelling of Extremities. Neurological Not Present- Decreased Memory, Fainting, Headaches, Numbness, Seizures, Tingling, Tremor, Trouble walking and Weakness. Psychiatric Not Present- Anxiety, Bipolar, Change in Sleep Pattern, Depression, Fearful and Frequent crying. Endocrine Not Present- Cold Intolerance, Excessive Hunger, Hair Changes, Heat Intolerance, Hot flashes and New Diabetes. Hematology Not Present- Blood Thinners, Easy Bruising, Excessive bleeding, Gland problems, HIV and Persistent Infections.  Vitals ( Weight: 179.8 lb Height: 73in Body Surface Area: 2.06 m Body Mass Index: 23.72 kg/m  Temp.: 77F  Pulse: 84 (Regular)  BP: 136/80 (Sitting, Left Arm, Standard)        Physical Exam   The physical exam findings are as follows: Note:WDWN in NAD Eyes: Pupils equal, round; sclera anicteric HENT: Oral mucosa moist; good dentition Neck: No masses palpated, no thyromegaly Lungs: CTA  bilaterally; normal respiratory effort CV: Regular rate and rhythm; no murmurs; extremities well-perfused with no edema Abd: +bowel sounds, soft, non-tender, no palpable organomegaly; no palpable hernias Bilateral descended testes, no testicular masses, massive right inguinal hernia extending down towards the scrotum. No  sign of left inguinal hernia. The right inguinal hernia is not reducible. It is minimally tender. Skin: Warm, dry; no sign of jaundice Psychiatric - alert and oriented x 4; calm mood and affect    Assessment & Plan   INCARCERATED RIGHT INGUINAL HERNIA (K40.30)  Current Plans Schedule for Surgery - Right inguinal hernia repair with mesh. The surgical procedure has been discussed with the patient. Potential risks, benefits, alternative treatments, and expected outcomes have been explained. All of the patient's questions at this time have been answered. The likelihood of reaching the patient's treatment goal is good. The patient understand the proposed surgical procedure and wishes to proceed. Note:I explained to the patient that because of the massive size of the inguinal hernia, he will likely have some residual dead space and probable seroma after surgery.   Imogene Burn. Georgette Dover, MD, [place] Trauma Surgery Beeper [phone]  09/13/2018 8:17 AM

## 2018-09-13 NOTE — Anesthesia Procedure Notes (Signed)
Anesthesia Regional Block: TAP block   Pre-Anesthetic Checklist: ,, timeout performed, Correct Patient, Correct Site, Correct Laterality, Correct Procedure, Correct Position, site marked, Risks and benefits discussed,  Surgical consent,  Pre-op evaluation,  At surgeon's request and post-op pain management  Laterality: Right and Lower  Prep: chloraprep       Needles:  Injection technique: Single-shot  Needle Type: Echogenic Needle     Needle Length: 9cm  Needle Gauge: 21     Additional Needles:   Procedures:,,,, ultrasound used (permanent image in chart),,,,  Narrative:  Start time: 09/13/2018 9:49 AM End time: 09/13/2018 9:56 AM Injection made incrementally with aspirations every 5 mL.  Performed by: Personally  Anesthesiologist: Annye Asa, MD  Additional Notes: Pt identified in Holding room.  Monitors applied. Working IV access confirmed. Sterile prep, drape R flank.  #21ga ECHOgenic needle into TAP with US guidance.  30cc 0.5% Bupivacaine with 1:200k epi injected incrementally after negative test dose.  Patient asymptomatic, VSS, no heme aspirated, tolerated well.  Jenita Seashore, MD

## 2018-09-13 NOTE — Anesthesia Procedure Notes (Signed)
Procedure Name: Intubation Date/Time: 09/13/2018 10:19 AM Performed by: Barrington Ellison, CRNA Pre-anesthesia Checklist: Patient identified, Emergency Drugs available, Suction available and Patient being monitored Patient Re-evaluated:Patient Re-evaluated prior to induction Oxygen Delivery Method: Circle System Utilized Preoxygenation: Pre-oxygenation with 100% oxygen Induction Type: IV induction Ventilation: Mask ventilation without difficulty Laryngoscope Size: Mac and 4 Grade View: Grade II Tube type: Oral Tube size: 7.5 mm Number of attempts: 1 Airway Equipment and Method: Stylet and Oral airway Placement Confirmation: ETT inserted through vocal cords under direct vision,  positive ETCO2 and breath sounds checked- equal and bilateral Secured at: 23 cm Tube secured with: Tape Dental Injury: Teeth and Oropharynx as per pre-operative assessment

## 2018-09-13 NOTE — Discharge Instructions (Signed)
[facility] _______[facility] ° °UMBILICAL OR INGUINAL HERNIA REPAIR: POST OP INSTRUCTIONS ° °Always review your discharge instruction sheet given to you by the facility where your surgery was performed. °IF YOU HAVE DISABILITY OR FAMILY LEAVE FORMS, YOU MUST BRING THEM TO THE OFFICE FOR PROCESSING.   °DO NOT GIVE THEM TO YOUR DOCTOR. ° °1. A  prescription for pain medication may be given to you upon discharge.  Take your pain medication as prescribed, if needed.  If narcotic pain medicine is not needed, then you may take acetaminophen (Tylenol) or ibuprofen (Advil) as needed. °2. Take your usually prescribed medications unless otherwise directed. °If you need a refill on your pain medication, please contact your pharmacy.  They will contact our office to request authorization. Prescriptions will not be filled after 5 pm or on week-ends. °3. You should follow a light diet the first 24 hours after arrival home, such as soup and crackers, etc.  Be sure to include lots of fluids daily.  Resume your normal diet the day after surgery. °4.Most patients will experience some swelling and bruising around the umbilicus or in the groin and scrotum.  Ice packs and reclining will help.  Swelling and bruising can take several days to resolve.  °6. It is common to experience some constipation if taking pain medication after surgery.  Increasing fluid intake and taking a stool softener (such as Colace) will usually help or prevent this problem from occurring.  A mild laxative (Milk of Magnesia or Miralax) should be taken according to package directions if there are no bowel movements after 48 hours. °7. Unless discharge instructions indicate otherwise, you may remove your bandages 24-48 hours after surgery, and you may shower at that time.  You may have steri-strips (small skin tapes) in place directly over the incision.  These strips should be left on the skin for 7-10 days.  If your surgeon used skin glue on the  incision, you may shower in 24 hours.  The glue will flake off over the next 2-3 weeks.  Any sutures or staples will be removed at the office during your follow-up visit. °8. ACTIVITIES:  You may resume regular (light) daily activities beginning the next day--such as daily self-care, walking, climbing stairs--gradually increasing activities as tolerated.  You may have sexual intercourse when it is comfortable.  Refrain from any heavy lifting or straining until approved by your doctor. ° °a.You may drive when you are no longer taking prescription pain medication, you can comfortably wear a seatbelt, and you can safely maneuver your car and apply brakes. °b.RETURN TO WORK:   °_____________________________________________ ° °9.You should see your doctor in the office for a follow-up appointment approximately 2-3 weeks after your surgery.  Make sure that you call for this appointment within a day or two after you arrive home to insure a convenient appointment time. °10.OTHER INSTRUCTIONS: _________________________ °   _____________________________________ ° °WHEN TO CALL YOUR DOCTOR: °1. Fever over 101.0 °2. Inability to urinate °3. Nausea and/or vomiting °4. Extreme swelling or bruising °5. Continued bleeding from incision. °6. Increased pain, redness, or drainage from the incision ° °The clinic staff is available to answer your questions during regular business hours.  Please don’t hesitate to call and ask to speak to one of the nurses for clinical concerns.  If you have a medical emergency, go to the nearest emergency room or call 911.  A surgeon from [facility] is always on call at the hospital ° ° °  [address], [address], [place], [place]  [address] ? ° [address] [address], [place],    [address] °[phone] ? [phone] ? FAX [phone] °Web site: [email or web address] °

## 2018-09-13 NOTE — Transfer of Care (Signed)
Immediate Anesthesia Transfer of Care Note  Patient: Clenton Pare Polidori  Procedure(s) Performed: RIGHT INGUINAL HERNIA REPAIR WITH MESH (Right Inguinal)  Patient Location: PACU  Anesthesia Type:General  Level of Consciousness: awake  Airway & Oxygen Therapy: Patient Spontanous Breathing  Post-op Assessment: Report given to RN  Post vital signs: Reviewed and stable  Last Vitals:  Vitals Value Taken Time  BP    Temp    Pulse    Resp    SpO2      Last Pain:  Vitals:   09/13/18 1000  TempSrc:   PainSc: 0-No pain      Patients Stated Pain Goal: 3 (78/00/44 [id number])  Complications: No apparent anesthesia complications

## 2018-09-14 ENCOUNTER — Encounter (HOSPITAL_COMMUNITY): Payer: Self-pay | Admitting: Surgery

## 2018-09-18 ENCOUNTER — Ambulatory Visit: Payer: BC Managed Care – PPO | Admitting: Internal Medicine

## 2018-09-18 ENCOUNTER — Other Ambulatory Visit: Payer: Self-pay

## 2018-09-18 ENCOUNTER — Encounter: Payer: Self-pay | Admitting: Internal Medicine

## 2018-09-18 VITALS — BP 136/87 | HR 67 | Temp 98.4°F | Wt 179.2 lb

## 2018-09-18 DIAGNOSIS — R350 Frequency of micturition: Secondary | ICD-10-CM | POA: Diagnosis not present

## 2018-09-18 DIAGNOSIS — Z9889 Other specified postprocedural states: Secondary | ICD-10-CM

## 2018-09-18 DIAGNOSIS — I10 Essential (primary) hypertension: Secondary | ICD-10-CM | POA: Diagnosis not present

## 2018-09-18 DIAGNOSIS — Z9111 Patient's noncompliance with dietary regimen: Secondary | ICD-10-CM

## 2018-09-18 DIAGNOSIS — R3915 Urgency of urination: Secondary | ICD-10-CM

## 2018-09-18 DIAGNOSIS — E119 Type 2 diabetes mellitus without complications: Secondary | ICD-10-CM

## 2018-09-18 DIAGNOSIS — R351 Nocturia: Secondary | ICD-10-CM

## 2018-09-18 DIAGNOSIS — Z8719 Personal history of other diseases of the digestive system: Secondary | ICD-10-CM

## 2018-09-18 DIAGNOSIS — Z79899 Other long term (current) drug therapy: Secondary | ICD-10-CM

## 2018-09-18 DIAGNOSIS — K4090 Unilateral inguinal hernia, without obstruction or gangrene, not specified as recurrent: Secondary | ICD-10-CM

## 2018-09-18 DIAGNOSIS — Z7984 Long term (current) use of oral hypoglycemic drugs: Secondary | ICD-10-CM

## 2018-09-18 MED ORDER — HYDROCHLOROTHIAZIDE 25 MG PO TABS: 25.0000 mg | ORAL_TABLET | Freq: Every day | ORAL | 2 refills | Status: DC

## 2018-09-18 NOTE — Assessment & Plan Note (Signed)
The patient recorded his blood sugars with Accu-Chek and has shown stable blood sugar control.  He is tested at 56 times within the last month, with an average blood sugar of 135.  Max was 207.  Lowest was 81.  His last A1c was 8.0 2 months ago.  Previous A1c was 6.8 11 months ago.  The patient says the coronavirus and having a recent hernia surgery kind of threw him off.  He is attempting to get back on track now.  His diabetes appears to be stable at this moment.  I believe his urinary frequency is are not due to uncontrolled sugars, but more due to side effect of taking the Jardiance.  I expect this to improve and will follow this up at the next visit. - Do not take Jardiance since this is increasing urinary frequency - Continue sitagliptin 50 mg - Encourage patient to avoid sweet drinks and sugary snacks. - Follow-up in 4 weeks for A1c check

## 2018-09-18 NOTE — Progress Notes (Addendum)
   CC: DM follow up  HPI:  Seth [name] is a 63 y.o. with a hx as noted below who presents for diabetes follow up. The only complaint Seth [name] has today is increased urinary frequency.  It started when he was prescribed Jardiance at his last visit 2 months ago.  He says that he was having urinary urgency and had to wake up 8-10 times a night to pee.  He stoppedtaking it on the 8/15.  His urinary frequency improved, noting that he only gets up  4-5 times a night to urinate out.  He says his stream is steady and does not have any trouble initiating it.  He does not strain to urinate.  No [name] or discomfort while peeing.  He also empties his bladder completely.  Of note, the patient says he had a slight headache when his initial [name] pressure was taken today.  His systolic BP was [id number], which is high for him.  He usually takes his [name] pressure a couple times a week and it is in the 120s.  Recheck [name] pressure was 136.  Past Medical History:  Diagnosis Date  . CVA (cerebral vascular accident) ([place]) 12/2015  . Diabetes ([place]) 2017  . Hyperlipidemia   . Hypertension    Review of Systems:  Negative unless mentioned in the HPI   Physical Exam:  Vitals:   09/18/18 1534  Weight: 179 lb 3.2 oz (81.3 kg)   Physical Exam Constitutional:      General: He is not in acute distress.    Appearance: Normal appearance. He is not toxic-appearing.  HENT:     Head: Normocephalic and atraumatic.  Eyes:     General: No scleral icterus.       Right eye: No discharge.        Left eye: No discharge.     Extraocular Movements: Extraocular movements intact.  Cardiovascular:     Rate and Rhythm: Normal rate and regular rhythm.     Pulses: Normal pulses.     Heart sounds: Normal heart sounds. No murmur. No friction rub. No gallop.   Pulmonary:     Effort: Pulmonary effort is normal. No respiratory distress.     Breath sounds: Normal breath sounds. No wheezing or rales.  Abdominal:   General: Abdomen is flat. Bowel sounds are normal. There is no distension.     Palpations: Abdomen is soft.     Tenderness: There is no abdominal tenderness.     Comments: Hernia incision healing well  Neurological:     General: No focal deficit present.     Mental Status: He is alert and oriented to person, place, and time.  Psychiatric:        Mood and Affect: Mood normal.    Assessment & Plan:   See Encounters Tab for problem based charting.  Patient seen with Dr. Philipp Ovens

## 2018-09-18 NOTE — Assessment & Plan Note (Signed)
Patient had right-sided inguinal hernia repair on 8/19.  The patient has had good pain control, and denies having trouble with bowel movements.  He is also eating and drinking well. - Continue to follow-up at following visits -Follow-up with surgeon as needed

## 2018-09-18 NOTE — Patient Instructions (Signed)
Thank you for seeing [place] in clinic today.  Below is a summary of what we discussed:  1.  Diabetes - Come back in 4 weeks so we can check your A1c. Try to avoid sweet drinks and snacks low blood sugar.  2.  Increased urination - If your urinary symptoms continue to worsen, please give [place] a call.  In the meantime, we will monitor and follow this up at your next visit.  3.  Hypertension - We refilled your hydrochlorothiazide.  You may pick this up at your pharmacy.  If you have any questions or concerns in the meantime, please feel free to reach out to [place].

## 2018-09-18 NOTE — Assessment & Plan Note (Signed)
Patient's blood pressure was slightly elevated initially, however decreased to 136/86.  Patient says his blood pressures usually in the 120s at home. - Continue amlodipine 10 mg, hydrochlorothiazide 25 mg, and lisinopril 40 mg - Refill hydrochlorothiazide

## 2018-09-19 NOTE — Progress Notes (Signed)
Internal Medicine Clinic Attending  I saw and evaluated the patient.  I personally confirmed the key portions of the history and exam documented by Dr. Sheppard Coil and I reviewed pertinent patient test results.  The assessment, diagnosis, and plan were formulated together and I agree with the documentation in the resident's note with the following addition(s).   Patient reports chronic nocturia that acutely worsened after initiation of jardiance two months ago. He discontinued this medication on his own with improvement of symptoms, however continues to have nocturia 3-4 times a night. He does not seem bothered by these symptoms and they are not negatively impacting his life. He denies other symptoms of BPH including weak streak or incomplete bladder emptying. Plan is to monitor symptoms. Would recommend lifestyle modifications if symptoms worsen or become bothersome and further evaluation to rule out underlying BPH or sleep apnea.   For his diabetes, patient was previously well controlled on sitagliptin alone (unable to tolerate metformin due to GI side effects). He admits to dietary indiscretion over the past few months which likely explains his jump in Hgb A1c to 8.0 two months ago. He has since modified his diet and review of his glucometer today shows an average CBG of 135 with over 90% of readings within target range. We will discontinue jardiance and continue sitaglipitin monotherapy. Repeat hgb A1c in 4 weeks.

## 2018-09-20 ENCOUNTER — Other Ambulatory Visit: Payer: Self-pay | Admitting: *Deleted

## 2018-09-20 DIAGNOSIS — I10 Essential (primary) hypertension: Secondary | ICD-10-CM

## 2018-09-20 DIAGNOSIS — E119 Type 2 diabetes mellitus without complications: Secondary | ICD-10-CM

## 2018-09-21 ENCOUNTER — Other Ambulatory Visit: Payer: Self-pay | Admitting: *Deleted

## 2018-09-21 DIAGNOSIS — E119 Type 2 diabetes mellitus without complications: Secondary | ICD-10-CM

## 2018-09-21 DIAGNOSIS — I10 Essential (primary) hypertension: Secondary | ICD-10-CM

## 2018-09-25 MED ORDER — SITAGLIPTIN PHOSPHATE 50 MG PO TABS: 50.0000 mg | ORAL_TABLET | Freq: Every day | ORAL | 3 refills | Status: DC

## 2018-09-25 MED ORDER — ASPIRIN 81 MG PO TBEC: 81.0000 mg | DELAYED_RELEASE_TABLET | Freq: Every day | ORAL | 2 refills | Status: DC

## 2018-09-25 NOTE — Telephone Encounter (Signed)
Appears Aspirin was sent in 8/26.  Will send in new refill, however, in case pharmacy did not receive.

## 2018-10-05 ENCOUNTER — Other Ambulatory Visit: Payer: Self-pay | Admitting: *Deleted

## 2018-10-05 DIAGNOSIS — I10 Essential (primary) hypertension: Secondary | ICD-10-CM

## 2018-10-05 MED ORDER — LISINOPRIL 40 MG PO TABS: 40.0000 mg | ORAL_TABLET | Freq: Every day | ORAL | 3 refills | Status: DC

## 2018-10-06 NOTE — Telephone Encounter (Signed)
Refills addressed in another encounter.Seth [name], Darlene Cassady9/11/202011:28 AM

## 2018-11-06 ENCOUNTER — Ambulatory Visit: Payer: BC Managed Care – PPO | Admitting: Internal Medicine

## 2018-11-06 ENCOUNTER — Other Ambulatory Visit: Payer: Self-pay

## 2018-11-06 VITALS — BP 137/94 | HR 79 | Temp 99.6°F | Wt 179.9 lb

## 2018-11-06 DIAGNOSIS — E119 Type 2 diabetes mellitus without complications: Secondary | ICD-10-CM | POA: Diagnosis not present

## 2018-11-06 DIAGNOSIS — Z7984 Long term (current) use of oral hypoglycemic drugs: Secondary | ICD-10-CM

## 2018-11-06 DIAGNOSIS — N529 Male erectile dysfunction, unspecified: Secondary | ICD-10-CM | POA: Diagnosis not present

## 2018-11-06 DIAGNOSIS — I10 Essential (primary) hypertension: Secondary | ICD-10-CM

## 2018-11-06 DIAGNOSIS — R7989 Other specified abnormal findings of blood chemistry: Secondary | ICD-10-CM

## 2018-11-06 DIAGNOSIS — Z720 Tobacco use: Secondary | ICD-10-CM

## 2018-11-06 DIAGNOSIS — Z23 Encounter for immunization: Secondary | ICD-10-CM

## 2018-11-06 DIAGNOSIS — Z79899 Other long term (current) drug therapy: Secondary | ICD-10-CM

## 2018-11-06 LAB — GLUCOSE, CAPILLARY: Glucose-Capillary: 121 mg/dL — ABNORMAL HIGH (ref 70–99)

## 2018-11-06 LAB — POCT GLYCOSYLATED HEMOGLOBIN (HGB A1C): Hemoglobin A1C: 7.2 % — AB (ref 4.0–5.6)

## 2018-11-06 MED ORDER — TADALAFIL 10 MG PO TABS: 10.0000 mg | ORAL_TABLET | ORAL | 1 refills | Status: DC | PRN

## 2018-11-06 MED ORDER — PEN NEEDLES 31G X 5 MM MISC: 1.0000 "application " | Freq: Three times a day (TID) | 10 refills | Status: AC

## 2018-11-06 NOTE — Assessment & Plan Note (Signed)
Patient's blood pressure was 137/97 today.  Creatinine was slightly elevated 2 months ago 1.27.  Will obtain BMP to reevaluate. - Continue amlodipine 10 mg and hydrochlorothiazide 25 mg - Discontinue lisinopril 40 mg

## 2018-11-06 NOTE — Assessment & Plan Note (Signed)
Patient says he ran out of Cialis.  Would like a refill - Refilled Cialis 10 mg as needed

## 2018-11-06 NOTE — Patient Instructions (Addendum)
Thank you for visiting [place] in clinic today.  Below is a summary of what we discussed:  1.  Diabetes: Your hemoglobin A1c was 7.2 today. - Continue Januvia 50 mg daily - I refilled your needles today - keep avoiding high carbohydrate and sugar rich foods.  2.  Hypertension - Stop taking lisinopril 40 mg for now. We got blood test today to look at your kidney numbers. I will be in touch about the results. - Continue taking amlodipine 10 mg, hydrochlorothiazide 25 mg - I also refilled your Cialis prescription.  3. Follow up:  - Follow up with [place] in 3 months.  If you have any questions or concerns, please feel free to reach out to [place].

## 2018-11-06 NOTE — Assessment & Plan Note (Signed)
Last BMP was 2 months ago and showed elevated creatinine 1.29.  The patient has been taking his lisinopril 10 mg daily.  - Obtain BMP today, follow-up with patient the results  - Discontinue lisinopril 40 mg

## 2018-11-06 NOTE — Assessment & Plan Note (Addendum)
Patient recorded his blood sugars with Accu-Chek and shown stable blood sugar control.  A1c 7.2 today from 8.0 in June.  Patient says he is stopped drinking as much soda as he did in the past.  He says he needs a refill on his pen needles. - Continue Januvia 50 mg daily - Refilled pen needles, sent to pharmacy - Encouraged patient to avoid sweet drinks and sugary snacks -Check A1c in January

## 2018-11-06 NOTE — Progress Notes (Signed)
   CC: T2DM follow up  HPI:   [name] is a 63 y.o. male with a past medical history as noted below who presents today for diabetes follow-up. Saw the patient last in the last week of August. Last A1c recorded was in June of this year and was 8.0. Today 7.2.  The patient says he has been drinking less soda and more mindful of what he eats.  Does not have any urinary complaints at this time.  He says he wakes up roughly 2-3 times a night but thinks this is due to drinking fluids later in the evening.  Pt says he smokes a pack every 2 weeks.  Confident that he can quit on his own.  Diabetic foot exam - 06/2018  Diabetic eye exam -  01/2017 no retinopathy  Colonoscopy - 02/2017 benign polyp removal. Flu shot - needs one today.  Past Medical History:  Diagnosis Date  . CVA (cerebral vascular accident) ([place]) 12/2015  . Diabetes ([place]) 2017  . Hyperlipidemia   . Hypertension    Review of Systems: All systems were reviewed and are otherwise negative unless mentioned in the HPI.  Physical Exam:  Vitals:   11/06/18 1540  BP: (!) 137/94  Pulse: 79  Temp: 99.6 F (37.6 C)  TempSrc: Oral  SpO2: 99%  Weight: 179 lb 14.4 oz (81.6 kg)    Physical Exam Vitals signs reviewed.  Constitutional:      General: He is not in acute distress.    Appearance: Normal appearance. He is normal weight. He is not ill-appearing or toxic-appearing.  HENT:     Head: Normocephalic and atraumatic.  Eyes:     General: No scleral icterus.       Right eye: No discharge.        Left eye: No discharge.  Cardiovascular:     Rate and Rhythm: Normal rate and regular rhythm.     Pulses: Normal pulses.     Heart sounds: Normal heart sounds. No murmur. No friction rub. No gallop.   Pulmonary:     Effort: Pulmonary effort is normal. No respiratory distress.     Breath sounds: Normal breath sounds. No wheezing or rales.  Abdominal:     General: Abdomen is flat. Bowel sounds are normal. There is no  distension.     Palpations: Abdomen is soft. There is no mass.     Tenderness: There is no abdominal tenderness. There is no guarding.     Hernia: No hernia is present.     Comments: Well-healed scar at the right lower quadrant of the abdomen where hernia surgery was.  Neurological:     General: No focal deficit present.     Mental Status: He is alert and oriented to person, place, and time.  Psychiatric:        Mood and Affect: Mood normal.    Assessment & Plan:   See Encounters Tab for problem based charting.  Patient seen with Dr. Rebeca Alert

## 2018-11-06 NOTE — Assessment & Plan Note (Signed)
Patient says he is currently smoking about 1 pack every 2 weeks.  He says he is able to quit cold [place] but was under stress recently due to mother's death.  He says he is confident he can quit on his own.  We will discuss at next visit -Follow-up at next visit

## 2018-11-06 NOTE — Assessment & Plan Note (Signed)
Flu shot was administered today

## 2018-11-07 ENCOUNTER — Telehealth: Payer: Self-pay | Admitting: Internal Medicine

## 2018-11-07 ENCOUNTER — Telehealth: Payer: Self-pay

## 2018-11-07 LAB — BMP8+ANION GAP
Anion Gap: 15 mmol/L (ref 10.0–18.0)
BUN/Creatinine Ratio: 11 (ref 10–24)
BUN: 13 mg/dL (ref 8–27)
CO2: 21 mmol/L (ref 20–29)
Calcium: 9.7 mg/dL (ref 8.6–10.2)
Chloride: 103 mmol/L (ref 96–106)
Creatinine, Ser: 1.22 mg/dL (ref 0.76–1.27)
GFR calc Af Amer: 72 mL/min/{1.73_m2} (ref >59)
GFR calc non Af Amer: 63 mL/min/{1.73_m2} (ref >59)
Glucose: 110 mg/dL — ABNORMAL HIGH (ref 65–99)
Potassium: 3.7 mmol/L (ref 3.5–5.2)
Sodium: 139 mmol/L (ref 134–144)

## 2018-11-07 NOTE — Telephone Encounter (Signed)
Requesting to speak with Dr. Sheppard Coil for lab results. Please call pt back.

## 2018-11-07 NOTE — Telephone Encounter (Signed)
Called Seth [name] and talked for about 5 minutes discussing the results of his BMP. His creatinine was slightly elevated to 1.29 2 months ago and was rechecked at yesterday's clinic visit.  His creatinine is at 1.22 currently.  I told the patient to continue taking his lisinopril 40 mg daily for now.  We will recheck his labs at his next clinic visit to ensure his creatinine does not increase again.  Patient endorsed understanding.  Earlene Plater, MD Internal Medicine, PGY1 Pager: [phone]  11/07/2018,3:35 PM

## 2018-11-10 NOTE — Progress Notes (Signed)
Internal Medicine Clinic Attending  I saw and evaluated the patient.  I personally confirmed the key portions of the history and exam documented by Dr. Sheppard Coil and I reviewed pertinent patient test results.  The assessment, diagnosis, and plan were formulated together and I agree with the documentation in the resident's note.  As noted in later telephone note by Dr. Sheppard Coil, after reviewing creatinine trend, will continue lisinopril for renal protection in diabetes.  Lenice Pressman, M.D., Ph.D.

## 2019-02-05 ENCOUNTER — Encounter: Payer: Self-pay | Admitting: Internal Medicine

## 2019-02-05 ENCOUNTER — Ambulatory Visit: Payer: BLUE CROSS/BLUE SHIELD | Admitting: Internal Medicine

## 2019-02-05 ENCOUNTER — Other Ambulatory Visit: Payer: Self-pay

## 2019-02-05 VITALS — BP 122/86 | HR 90 | Temp 99.1°F | Ht 73.5 in | Wt 171.1 lb

## 2019-02-05 DIAGNOSIS — I10 Essential (primary) hypertension: Secondary | ICD-10-CM

## 2019-02-05 DIAGNOSIS — Z79899 Other long term (current) drug therapy: Secondary | ICD-10-CM

## 2019-02-05 DIAGNOSIS — F17211 Nicotine dependence, cigarettes, in remission: Secondary | ICD-10-CM

## 2019-02-05 DIAGNOSIS — M25552 Pain in left hip: Secondary | ICD-10-CM | POA: Diagnosis not present

## 2019-02-05 DIAGNOSIS — R7989 Other specified abnormal findings of blood chemistry: Secondary | ICD-10-CM

## 2019-02-05 DIAGNOSIS — E119 Type 2 diabetes mellitus without complications: Secondary | ICD-10-CM | POA: Diagnosis not present

## 2019-02-05 DIAGNOSIS — Z720 Tobacco use: Secondary | ICD-10-CM

## 2019-02-05 DIAGNOSIS — E785 Hyperlipidemia, unspecified: Secondary | ICD-10-CM | POA: Diagnosis not present

## 2019-02-05 DIAGNOSIS — Z791 Long term (current) use of non-steroidal anti-inflammatories (NSAID): Secondary | ICD-10-CM

## 2019-02-05 DIAGNOSIS — M79652 Pain in left thigh: Secondary | ICD-10-CM | POA: Diagnosis not present

## 2019-02-05 DIAGNOSIS — Z7984 Long term (current) use of oral hypoglycemic drugs: Secondary | ICD-10-CM

## 2019-02-05 LAB — POCT GLYCOSYLATED HEMOGLOBIN (HGB A1C): Hemoglobin A1C: 7.3 % — AB (ref 4.0–5.6)

## 2019-02-05 LAB — GLUCOSE, CAPILLARY: Glucose-Capillary: 117 mg/dL — ABNORMAL HIGH (ref 70–99)

## 2019-02-05 NOTE — Assessment & Plan Note (Addendum)
The patient reports he feels that he has good control over his blood sugars lately.  Hgb A1 A1c was 7.3 from 7.2 at prior visit. -Recheck A1c in 3 months -Continue Januvia 50 mg daily

## 2019-02-05 NOTE — Assessment & Plan Note (Signed)
Patient states he has not smoked any cigarettes in the past month.  He bought some over-the-counter nicotine gum and has been using that sparingly for cravings.  I encouraged the patient to continue abstaining from using cigarettes -Reassured the patient if he needs medication to help with the cravings, but we can prescribe him something.  Patient will continue using over-the-counter nicotine gum as needed for now. -Continue smoking cessation counseling at next visit

## 2019-02-05 NOTE — Assessment & Plan Note (Signed)
Previous BMP was significant for creatinine elevated to 1.22.  It was decreased from 1.29 previously.  I had a discussion with the patient about continuing his lisinopril for now, but watching his creatinine closely in the interim. -BMP ordered

## 2019-02-05 NOTE — Progress Notes (Signed)
   CC: Diabetes follow up  HPI:  [name] is a 64 y.o. with a PMHx as noted below who presents today for diabetes follow up.  The patient states that his sugars have been well controlled, noting that the highest reading he has had on his glucometer was in the 170s.  Patient brought his glucometer paperwork with him.  The only complaint that the patient has today is that he has been having some L hip and thigh pain.  He describes it as muscle soreness and tightness.  He has been taking Advil on and off for for 3-4 days at at time as needed. Pt states he takes roughly 4 pills a day at most.  Stretching seems to help as well.  Pt states he has not smoked any cigarettes in one month. Has been using over-the-counter nicotine gum as needed for cravings.  Flu shot: received at last visit Eye exam: 1/19, no retinopathy  Colonoscopy: last 02/2017  Past Medical History:  Diagnosis Date  . CVA (cerebral vascular accident) ([place]) 12/2015  . Diabetes ([place]) 2017  . Hyperlipidemia   . Hypertension    Review of Systems:  All systems were reviewed and are otherwise negative unless mentioned in the HPI.  Physical Exam:  Vitals:   02/05/19 1604  BP: 122/86  Pulse: 90  Temp: 99.1 F (37.3 C)  TempSrc: Oral  SpO2: 96%  Weight: 171 lb 1.6 oz (77.6 kg)  Height: 6' 1.5" (1.867 m)   Physical Exam Constitutional:      General: He is not in acute distress.    Appearance: Normal appearance. He is not ill-appearing.  HENT:     Head: Normocephalic and atraumatic.  Eyes:     General:        Right eye: No discharge.        Left eye: No discharge.     Extraocular Movements: Extraocular movements intact.  Cardiovascular:     Rate and Rhythm: Normal rate and regular rhythm.     Pulses: Normal pulses.     Heart sounds: Normal heart sounds. No murmur. No friction rub. No gallop.   Pulmonary:     Effort: Pulmonary effort is normal. No respiratory distress.     Breath sounds: Normal breath  sounds. No wheezing or rales.  Abdominal:     General: Abdomen is flat. There is no distension.     Palpations: Abdomen is soft.     Tenderness: There is no abdominal tenderness. There is no guarding.  Musculoskeletal:        General: No swelling or tenderness. Normal range of motion.     Comments: Bilateral lower extremity strength 5/5 with hip, knee, and ankle flexion and extension.  No pain with maneuvers  Neurological:     General: No focal deficit present.     Mental Status: He is alert and oriented to person, place, and time.  Psychiatric:        Mood and Affect: Mood normal.    Assessment & Plan:   See Encounters Tab for problem based charting.  Patient discussed with Dr. Rebeca Alert

## 2019-02-05 NOTE — Assessment & Plan Note (Addendum)
Patient is normotensive today at 122/86.  The patient states he has been taking his lisinopril, amlodipine, and hydrochlorothiazide consistently.  At his last visit, his creatinine bumped to 1.22. I had a follow-up call with the patient about his creatinine and advised him to continue taking his lisinopril and we will check his creatinine at this visit. -BMP ordered for today -Continue home regimen of lisinopril 40 mg, amlodipine 10 mg and HCTZ 25 mg daily

## 2019-02-05 NOTE — Patient Instructions (Addendum)
Thank you for visiting [place] in clinic today.  Below is a summary of what we discussed:  1.  Hypertension -Your blood pressures look fantastic today.  Please continue taking lisinopril 40 mg daily, amlodipine 10 mg daily, and chlorothiazide 25 mg daily  2.  Diabetes -Your hemoglobin A1c was 7.3.  Continue avoiding carbohydrate rich foods, like rice, potatoes and bread.  Also avoid sugary drinks and sugary candies. -Please schedule follow-up appointment in 3 months to recheck your A1c.  3.  Smoking -I am delighted to hear that you are not smoking!  Continue the good work!  If you feel like the cravings get worse, there are medications we can give you to help decrease those cravings.  Let me know what I can do to help you.  4.  Left thigh pain -Stretch your left thigh before you go to sleep.  You can look up stretching exercises on YouTube. -Apply ice to the sore area for 20 minutes at a time -Use Advil sparingly so we can continue protecting your kidneys  If you have any questions or concerns, please feel free to reach out to [place].

## 2019-02-06 LAB — BMP8+ANION GAP
Anion Gap: 14 mmol/L (ref 10.0–18.0)
BUN/Creatinine Ratio: 8 — ABNORMAL LOW (ref 10–24)
BUN: 8 mg/dL (ref 8–27)
CO2: 25 mmol/L (ref 20–29)
Calcium: 9.6 mg/dL (ref 8.6–10.2)
Chloride: 100 mmol/L (ref 96–106)
Creatinine, Ser: 0.96 mg/dL (ref 0.76–1.27)
GFR calc Af Amer: 96 mL/min/{1.73_m2} (ref >59)
GFR calc non Af Amer: 83 mL/min/{1.73_m2} (ref >59)
Glucose: 111 mg/dL — ABNORMAL HIGH (ref 65–99)
Potassium: 3.7 mmol/L (ref 3.5–5.2)
Sodium: 139 mmol/L (ref 134–144)

## 2019-02-06 LAB — LIPID PANEL
Chol/HDL Ratio: 3.6 ratio (ref 0.0–5.0)
Cholesterol, Total: 150 mg/dL (ref 100–199)
HDL: 42 mg/dL (ref >39)
LDL Chol Calc (NIH): 93 mg/dL (ref 0–99)
Triglycerides: 78 mg/dL (ref 0–149)
VLDL Cholesterol Cal: 15 mg/dL (ref 5–40)

## 2019-02-20 NOTE — Progress Notes (Signed)
Internal Medicine Clinic Attending  Case discussed with Dr. Alexander at the time of the visit.  We reviewed the resident's history and exam and pertinent patient test results.  I agree with the assessment, diagnosis, and plan of care documented in the resident's note.  Alexander Raines, M.D., Ph.D.  

## 2019-03-20 ENCOUNTER — Other Ambulatory Visit: Payer: Self-pay | Admitting: *Deleted

## 2019-03-20 DIAGNOSIS — I10 Essential (primary) hypertension: Secondary | ICD-10-CM

## 2019-03-20 MED ORDER — AMLODIPINE BESYLATE 10 MG PO TABS: 10.0000 mg | ORAL_TABLET | Freq: Every day | ORAL | 1 refills | Status: DC

## 2019-06-04 ENCOUNTER — Encounter: Payer: Self-pay | Admitting: *Deleted

## 2019-06-18 ENCOUNTER — Other Ambulatory Visit: Payer: Self-pay | Admitting: Internal Medicine

## 2019-06-18 DIAGNOSIS — I10 Essential (primary) hypertension: Secondary | ICD-10-CM

## 2019-06-18 DIAGNOSIS — E119 Type 2 diabetes mellitus without complications: Secondary | ICD-10-CM

## 2019-09-19 ENCOUNTER — Other Ambulatory Visit: Payer: Self-pay | Admitting: *Deleted

## 2019-09-19 DIAGNOSIS — E119 Type 2 diabetes mellitus without complications: Secondary | ICD-10-CM

## 2019-09-19 DIAGNOSIS — I10 Essential (primary) hypertension: Secondary | ICD-10-CM

## 2019-09-19 MED ORDER — ATORVASTATIN CALCIUM 80 MG PO TABS: 80.0000 mg | ORAL_TABLET | Freq: Every day | ORAL | 3 refills | Status: DC

## 2019-09-19 NOTE — Telephone Encounter (Signed)
Next appt scheduled 9/8 with PCP.

## 2019-09-20 ENCOUNTER — Other Ambulatory Visit: Payer: Self-pay | Admitting: *Deleted

## 2019-09-20 DIAGNOSIS — I10 Essential (primary) hypertension: Secondary | ICD-10-CM

## 2019-09-20 MED ORDER — AMLODIPINE BESYLATE 10 MG PO TABS: 10.0000 mg | ORAL_TABLET | Freq: Every day | ORAL | 1 refills | Status: DC

## 2019-10-02 ENCOUNTER — Other Ambulatory Visit: Payer: Self-pay | Admitting: Internal Medicine

## 2019-10-03 ENCOUNTER — Encounter: Payer: Self-pay | Admitting: Student

## 2019-10-03 ENCOUNTER — Ambulatory Visit: Payer: BLUE CROSS/BLUE SHIELD | Admitting: Student

## 2019-10-03 ENCOUNTER — Other Ambulatory Visit: Payer: Self-pay

## 2019-10-03 VITALS — BP 162/94 | HR 70 | Temp 98.7°F | Ht 73.0 in | Wt 177.9 lb

## 2019-10-03 DIAGNOSIS — Z720 Tobacco use: Secondary | ICD-10-CM

## 2019-10-03 DIAGNOSIS — Z8673 Personal history of transient ischemic attack (TIA), and cerebral infarction without residual deficits: Secondary | ICD-10-CM

## 2019-10-03 DIAGNOSIS — Z87891 Personal history of nicotine dependence: Secondary | ICD-10-CM | POA: Diagnosis not present

## 2019-10-03 DIAGNOSIS — Z23 Encounter for immunization: Secondary | ICD-10-CM | POA: Diagnosis not present

## 2019-10-03 DIAGNOSIS — E119 Type 2 diabetes mellitus without complications: Secondary | ICD-10-CM | POA: Diagnosis not present

## 2019-10-03 DIAGNOSIS — I10 Essential (primary) hypertension: Secondary | ICD-10-CM | POA: Diagnosis not present

## 2019-10-03 LAB — POCT GLYCOSYLATED HEMOGLOBIN (HGB A1C): Hemoglobin A1C: 6.4 % — AB (ref 4.0–5.6)

## 2019-10-03 LAB — GLUCOSE, CAPILLARY: Glucose-Capillary: 101 mg/dL — ABNORMAL HIGH (ref 70–99)

## 2019-10-03 MED ORDER — CHLORTHALIDONE 25 MG PO TABS: 12.5000 mg | ORAL_TABLET | Freq: Every day | ORAL | 2 refills | Status: DC

## 2019-10-03 MED ORDER — EMPAGLIFLOZIN 10 MG PO TABS: 10.0000 mg | ORAL_TABLET | Freq: Every day | ORAL | 2 refills | Status: DC

## 2019-10-03 NOTE — Assessment & Plan Note (Addendum)
Hemoglobin A1c 6.4 today.  Glucometer shows good blood glucose range with morning fasting glucose < 120 with a few spikes in the 150s in the afternoon.  Patient reports eating healthy and incorporating fruits, vegetables and 0 sugar drinks.  Metformin was started in the past and D/C due to GI side effects.  Jardiance was also started and D/C due to nocturia.  Given his underlying hypertension and CVA, SGLT2 is a better option than Januvia.  Patient agrees to retry Jardiance and will contact us if unable to tolerate.  Will hold Januvia.  Patient states that he does check his feet every day and denies any wounds.  He is due for an eye exam.  Plan: -Start Jardiance 10 mg daily -Stop Januvia -Continue checking blood sugar at home -Follow-up eye exam.  Patient does not need referral -Continue working on [employer]

## 2019-10-03 NOTE — Assessment & Plan Note (Addendum)
Blood pressure today 150/100.  Patient reports following a low-salt diet which he does cook at home.  He does not measure BP at home and states that he will obtain a blood pressure cuff next week.  Regarding his medications, patient reports only taking lisinopril 40 mg and amlodipine 10 mg.  Patient states that he is not taking HCTZ.  Will start chlorthalidone 12.5 mg daily.  Advised patient to stop taking HCTZ if he still has the medication at home.  Check BMP in 3 weeks.  Plan: -Start chlorthalidone 12.5 mg daily -Stop HCTZ -BMP in 3 weeks -Encourage patient to measure blood pressure at home -Continue low-salt diet  Addendum: BMP in 3 weeks show elevated creatinine of 1.36 and potassium of 3.5.  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  Patient was advised to increase fluid intake and repeat BMP in 2 weeks.  Recheck BMP showed normalized creatinine of 1.1 and potassium improved to 3.7.  His potassium has been chronically low since last year.  Patient is taking his blood pressure medications as instructed and has run out of his lisinopril and chlorthalidone for 2 days.  Apparently patient is taking the whole tablet of chlorthalidone of 25 mg instead of 12.5 mg daily.  This may explain the worsening his creatinine.  Patient states that his systolic blood pressure checked at home is around 110s.  Advised patient to take half a tablet of chlorthalidone 12.5 mg.  [facility] prescription sent to [facility].  Regarding his low potassium, advised patient to increase p.o. intake of potassium rich foods such as spinach, banana, oranges, broccoli... Recheck BMP next visit.  Patient verbalizes understand.  Next follow-up appointment in 3 months.

## 2019-10-03 NOTE — Patient Instructions (Addendum)
[name],  It was a pleasure getting to know you today.  Here is a summary of we talked about:  1.  Hypertension: Your blood pressure is a little higher than what we want it to be so I will START you on a new medication called CHLORTHALIDONE 12.5 mg.  Please STOP taking the HYDROCHLOROTHIAZIDE.  Remember to obtain a blood pressure cuff and measure your blood pressure at home.  Please come back in 3 weeks for some lab work.  2.  Diabetes: Hemoglobin A1c 6.4 today.  You are doing a good job!  Please keep up the good work with healthy diet.  I will STOP the JANUVIA and START you back on JARDIANCE 10 mg once a day.  The new medication is preferred given your underlying high blood pressure and stroke.  Please let us know if you cannot afford the medication.   3. History of stroke: please continue Aspirin and Lipitor.    Please contact us for any questions or concerns,  Take care,  Dr. Alfonse Spruce

## 2019-10-03 NOTE — Progress Notes (Signed)
   CC: Routine visit  HPI:  [name] is a 64 y.o. with past medical history of hypertension, diabetes, and history of CVA in 2017 who is here for routine visit.  Please see problem based charting for further details.  Past Medical History:  Diagnosis Date  . CVA (cerebral vascular accident) ([place]) 12/2015  . Diabetes ([place]) 2017  . Hyperlipidemia   . Hypertension    Review of Systems: As per HPI  Physical Exam: Physical Exam Constitutional:      General: He is not in acute distress.    Appearance: He is normal weight.  HENT:     Head: Normocephalic.  Eyes:     General: No scleral icterus.       Right eye: No discharge.        Left eye: No discharge.  Cardiovascular:     Rate and Rhythm: Normal rate and regular rhythm.     Heart sounds: No murmur heard.   Pulmonary:     Effort: Pulmonary effort is normal. No respiratory distress.     Breath sounds: Normal breath sounds.  Abdominal:     General: Bowel sounds are normal.     Palpations: Abdomen is soft.  Musculoskeletal:     Cervical back: Normal range of motion.     Right lower leg: No edema.     Left lower leg: No edema.     Comments: +2 pulses bilateral lower extremities No open wounds noted  Skin:    General: Skin is warm.     Coloration: Skin is not jaundiced.  Neurological:     Mental Status: He is alert.  Psychiatric:        Mood and Affect: Mood normal.     Vitals:   10/03/19 1511  BP: (!) 162/94  Pulse: 70  Temp: 98.7 F (37.1 C)  TempSrc: Oral  SpO2: 92%  Weight: 177 lb 14.4 oz (80.7 kg)  Height: 6\' 1"  (1.854 m)   Recheck blood pressure: 150/100  Assessment & Plan:   See Encounters Tab for problem based charting.  Patient seen with Dr. Dareen Piano

## 2019-10-03 NOTE — Assessment & Plan Note (Signed)
Patient quit smoking last year

## 2019-10-03 NOTE — Assessment & Plan Note (Signed)
Continue Lipitor. Aspirin held secondary to initiation of Eliquis. Follow-up with cardiology. 

## 2019-10-04 NOTE — Progress Notes (Signed)
Internal Medicine Clinic Attending  I saw and evaluated the patient.  I personally confirmed the key portions of the history and exam documented by Dr. Nguyen and I reviewed pertinent patient test results.  The assessment, diagnosis, and plan were formulated together and I agree with the documentation in the resident's note.\  

## 2019-10-24 ENCOUNTER — Other Ambulatory Visit: Payer: BLUE CROSS/BLUE SHIELD

## 2019-10-24 DIAGNOSIS — I10 Essential (primary) hypertension: Secondary | ICD-10-CM | POA: Diagnosis not present

## 2019-10-25 ENCOUNTER — Other Ambulatory Visit: Payer: Self-pay | Admitting: Student

## 2019-10-25 DIAGNOSIS — R7989 Other specified abnormal findings of blood chemistry: Secondary | ICD-10-CM

## 2019-10-25 LAB — BMP8+ANION GAP
Anion Gap: 17 mmol/L (ref 10.0–18.0)
BUN/Creatinine Ratio: 17 (ref 10–24)
BUN: 23 mg/dL (ref 8–27)
CO2: 24 mmol/L (ref 20–29)
Calcium: 9.2 mg/dL (ref 8.6–10.2)
Chloride: 100 mmol/L (ref 96–106)
Creatinine, Ser: 1.36 mg/dL — ABNORMAL HIGH (ref 0.76–1.27)
GFR calc Af Amer: 63 mL/min/{1.73_m2} (ref >59)
GFR calc non Af Amer: 55 mL/min/{1.73_m2} — ABNORMAL LOW (ref >59)
Glucose: 105 mg/dL — ABNORMAL HIGH (ref 65–99)
Potassium: 3.5 mmol/L (ref 3.5–5.2)
Sodium: 141 mmol/L (ref 134–144)

## 2019-11-15 ENCOUNTER — Other Ambulatory Visit (INDEPENDENT_AMBULATORY_CARE_PROVIDER_SITE_OTHER): Payer: BLUE CROSS/BLUE SHIELD

## 2019-11-15 ENCOUNTER — Other Ambulatory Visit: Payer: Self-pay

## 2019-11-15 DIAGNOSIS — R7989 Other specified abnormal findings of blood chemistry: Secondary | ICD-10-CM | POA: Diagnosis not present

## 2019-11-16 LAB — BMP8+ANION GAP
Anion Gap: 19 mmol/L — ABNORMAL HIGH (ref 10.0–18.0)
BUN/Creatinine Ratio: 14 (ref 10–24)
BUN: 15 mg/dL (ref 8–27)
CO2: 22 mmol/L (ref 20–29)
Calcium: 9.3 mg/dL (ref 8.6–10.2)
Chloride: 99 mmol/L (ref 96–106)
Creatinine, Ser: 1.11 mg/dL (ref 0.76–1.27)
GFR calc Af Amer: 81 mL/min/{1.73_m2} (ref >59)
GFR calc non Af Amer: 70 mL/min/{1.73_m2} (ref >59)
Glucose: 123 mg/dL — ABNORMAL HIGH (ref 65–99)
Potassium: 3.7 mmol/L (ref 3.5–5.2)
Sodium: 140 mmol/L (ref 134–144)

## 2019-11-22 ENCOUNTER — Other Ambulatory Visit: Payer: Self-pay | Admitting: Student

## 2019-11-22 DIAGNOSIS — I10 Essential (primary) hypertension: Secondary | ICD-10-CM

## 2019-11-22 MED ORDER — LISINOPRIL 40 MG PO TABS: 40.0000 mg | ORAL_TABLET | Freq: Every day | ORAL | 2 refills | Status: DC

## 2019-11-22 MED ORDER — CHLORTHALIDONE 25 MG PO TABS: 12.5000 mg | ORAL_TABLET | Freq: Every day | ORAL | 2 refills | Status: DC

## 2019-11-22 NOTE — Addendum Note (Signed)
Addended byGaylan Gerold on: 11/22/2019 01:48 PM   Modules accepted: Orders

## 2019-12-22 ENCOUNTER — Other Ambulatory Visit: Payer: Self-pay | Admitting: Student

## 2019-12-22 DIAGNOSIS — E119 Type 2 diabetes mellitus without complications: Secondary | ICD-10-CM

## 2020-01-02 ENCOUNTER — Ambulatory Visit (INDEPENDENT_AMBULATORY_CARE_PROVIDER_SITE_OTHER): Payer: BLUE CROSS/BLUE SHIELD | Admitting: Student

## 2020-01-02 ENCOUNTER — Encounter: Payer: Self-pay | Admitting: Student

## 2020-01-02 VITALS — BP 137/88 | HR 65 | Temp 98.3°F | Ht 73.0 in | Wt 176.2 lb

## 2020-01-02 DIAGNOSIS — E119 Type 2 diabetes mellitus without complications: Secondary | ICD-10-CM | POA: Diagnosis not present

## 2020-01-02 DIAGNOSIS — N471 Phimosis: Secondary | ICD-10-CM | POA: Insufficient documentation

## 2020-01-02 DIAGNOSIS — I10 Essential (primary) hypertension: Secondary | ICD-10-CM

## 2020-01-02 HISTORY — DX: Phimosis: N47.1

## 2020-01-02 LAB — POCT GLYCOSYLATED HEMOGLOBIN (HGB A1C): Hemoglobin A1C: 7.6 % — AB (ref 4.0–5.6)

## 2020-01-02 LAB — GLUCOSE, CAPILLARY: Glucose-Capillary: 102 mg/dL — ABNORMAL HIGH (ref 70–99)

## 2020-01-02 MED ORDER — SITAGLIPTIN PHOSPHATE 50 MG PO TABS: 50.0000 mg | ORAL_TABLET | Freq: Every day | ORAL | 3 refills | Status: DC

## 2020-01-02 MED ORDER — FLUCONAZOLE 150 MG PO TABS: 150.0000 mg | ORAL_TABLET | Freq: Once | ORAL | 0 refills | Status: AC

## 2020-01-02 NOTE — Assessment & Plan Note (Signed)
His blood pressure is well controlled today.  Continue current regimen -Continue amlodipine 10 mg daily -Continue lisinopril 40 mg daily -Continue chlorthalidone 12.5 mg

## 2020-01-02 NOTE — Assessment & Plan Note (Signed)
Patient complaining of phimosis that started after putting on Jardiance.  He complains of resistance when pulling his foreskin back in the morning.  Patient is not circumcised.  He also endorses itchiness and white discharge.  He denies dysuria, pus drainage or bloody drainage.  Patient is not sexually active.  Assessment and plan: This is likely a yeast infection as a side effects of Jardiance.  His phimosis could be a consequence of infection and inflammation.  Will stop Jardiance and start patient back on Januvia.  Will prescribe one dose of Diflucan 150 mg for his yeast infection.  Advised patient to call us back if symptoms do not improve.  Can consider urology referral if his symptoms worsen. -Stop Jardiance -Start Januvia 50 mg daily -Diflucan 150 mg once

## 2020-01-02 NOTE — Patient Instructions (Addendum)
[name],  It is a pleasure seeing you today.  Here is a summary of what we talked about  1.  Yeast infection: This could be a side effect from the [place].  I will stop this medication and start you back on Januvia.  I also sent a prescription for one-time dose of Diflucan to treat yeast infection.  Please let us know if this symptom do not improve.  We can refer you to a urologist if his symptoms worsen.  2.  Diabetes: We will switch to Januvia.  I will call you for your A1c results.  3.  Hypertension: Your blood pressure is good today.  Please continue current medications.  4.  Please use antifungal topical cream for athlete's foot.   Take care  Dr. Alfonse Spruce

## 2020-01-02 NOTE — Progress Notes (Signed)
   CC: Phimosis   HPI:  [name] is a 64 y.o. with past medical history of diabetes, hypertension, hyperlipidemia who presented to the clinic for chief complaint of phimosis and penile discharge.  Please see problem based charting for further detail  Past Medical History:  Diagnosis Date  . CVA (cerebral vascular accident) ([place]) 12/2015  . Diabetes ([place]) 2017  . Hyperlipidemia   . Hypertension    Review of Systems: As per HPI  Physical Exam:  Vitals:   01/02/20 1511  BP: 137/88  Pulse: 65  Temp: 98.3 F (36.8 C)  TempSrc: Oral  SpO2: 100%  Weight: 176 lb 3.2 oz (79.9 kg)  Height: 6\' 1"  (1.854 m)   Physical Exam Constitutional:      General: He is not in acute distress. HENT:     Head: Normocephalic.  Eyes:     General:        Right eye: No discharge.        Left eye: No discharge.  Cardiovascular:     Rate and Rhythm: Normal rate and regular rhythm.  Pulmonary:     Effort: No respiratory distress.     Breath sounds: Normal breath sounds.  Abdominal:     General: Bowel sounds are normal.  Genitourinary:    Comments: Patient is not circumcised.  Mild erythema noted at the penile glans and shaft.  There is some restriction with pulling foreskin.  No discharge noted.  Not painful to palpation. Musculoskeletal:     Comments: Normal pulses and sensation of bilateral feet. White and flaky scales noted between toes of left foot.  Skin:    General: Skin is warm.  Neurological:     Mental Status: He is alert.  Psychiatric:        Mood and Affect: Mood normal.     Assessment & Plan:   See Encounters Tab for problem based charting.  Patient seen with Dr. Jimmye Norman

## 2020-01-02 NOTE — Assessment & Plan Note (Signed)
A1c 7.6 today, which is elevated from 6.4 since last visit.  Patient states that he has been eating more because of the holiday and family reunion.    His next eye exam appointment is in January.  Advised him to follow-up with them.  Assessment and plan: Because of his yeast infection, will stop Jardiance and restart him back on Januvia.  Discussed with patient about Metformin and he refused to be back on Metformin due to side effects. -Stop Jardiance -Start Januvia 50 mg daily -A1c in 3 months -Advised patient to use antifungal cream for his tinea pedis

## 2020-01-07 NOTE — Progress Notes (Signed)
Internal Medicine Clinic Attending  I saw and evaluated the patient.  I personally confirmed the key portions of the history and exam documented by Dr. Alfonse Spruce and I reviewed pertinent patient test results.  The assessment, diagnosis, and plan were formulated together and I agree with the documentation in the resident's note.  THe phimosis is very mild, and I agree that it could be related to an edema related to a possible fungal infection.  No discharge visible today.  We discussed reasons for more acute return to care if he should worsen.

## 2020-03-14 ENCOUNTER — Other Ambulatory Visit: Payer: Self-pay | Admitting: Internal Medicine

## 2020-03-14 DIAGNOSIS — I10 Essential (primary) hypertension: Secondary | ICD-10-CM

## 2020-07-23 ENCOUNTER — Other Ambulatory Visit: Payer: Self-pay | Admitting: Internal Medicine

## 2020-07-23 DIAGNOSIS — E119 Type 2 diabetes mellitus without complications: Secondary | ICD-10-CM

## 2020-07-23 DIAGNOSIS — I10 Essential (primary) hypertension: Secondary | ICD-10-CM

## 2020-08-08 ENCOUNTER — Encounter: Payer: Self-pay | Admitting: Gastroenterology

## 2020-08-09 ENCOUNTER — Other Ambulatory Visit: Payer: Self-pay | Admitting: Student

## 2020-08-09 DIAGNOSIS — I10 Essential (primary) hypertension: Secondary | ICD-10-CM

## 2020-10-03 ENCOUNTER — Other Ambulatory Visit: Payer: Self-pay | Admitting: Student

## 2020-10-03 DIAGNOSIS — I10 Essential (primary) hypertension: Secondary | ICD-10-CM

## 2020-11-19 ENCOUNTER — Encounter: Payer: Self-pay | Admitting: Student

## 2020-11-19 ENCOUNTER — Other Ambulatory Visit: Payer: Self-pay

## 2020-11-19 ENCOUNTER — Ambulatory Visit (INDEPENDENT_AMBULATORY_CARE_PROVIDER_SITE_OTHER): Payer: Medicare HMO | Admitting: Student

## 2020-11-19 VITALS — BP 196/88 | HR 66

## 2020-11-19 DIAGNOSIS — Z0000 Encounter for general adult medical examination without abnormal findings: Secondary | ICD-10-CM

## 2020-11-19 DIAGNOSIS — E119 Type 2 diabetes mellitus without complications: Secondary | ICD-10-CM

## 2020-11-19 DIAGNOSIS — Z8673 Personal history of transient ischemic attack (TIA), and cerebral infarction without residual deficits: Secondary | ICD-10-CM | POA: Diagnosis not present

## 2020-11-19 DIAGNOSIS — I10 Essential (primary) hypertension: Secondary | ICD-10-CM | POA: Diagnosis not present

## 2020-11-19 DIAGNOSIS — E1159 Type 2 diabetes mellitus with other circulatory complications: Secondary | ICD-10-CM | POA: Diagnosis not present

## 2020-11-19 LAB — POCT GLYCOSYLATED HEMOGLOBIN (HGB A1C): Hemoglobin A1C: 7 % — AB (ref 4.0–5.6)

## 2020-11-19 LAB — GLUCOSE, CAPILLARY: Glucose-Capillary: 135 mg/dL — ABNORMAL HIGH (ref 70–99)

## 2020-11-19 MED ORDER — ASPIRIN 81 MG PO TBEC: 81.0000 mg | DELAYED_RELEASE_TABLET | Freq: Every day | ORAL | 2 refills | Status: DC

## 2020-11-19 MED ORDER — CHLORTHALIDONE 25 MG PO TABS: 12.5000 mg | ORAL_TABLET | Freq: Every day | ORAL | 2 refills | Status: DC

## 2020-11-19 NOTE — Assessment & Plan Note (Signed)
-   Referred to GI for colonoscopy - Flu shot given today

## 2020-11-19 NOTE — Assessment & Plan Note (Signed)
A1c 7.6 last year.  Patient has not been taking Januvia for 5 months because of running out.  Reports that he is doing better with healthier food choices and weight loss.  He reports polyuria without polydipsia, nausea, vomiting or abdominal pain.  A1c of 7 today.  Will not resume Januvia or start a new medication today.  We will follow-up in 3 months for repeat A1c.  If A1c is elevated, can consider adding Trulicity as patient preference. - Patient is due for an eye exam this year. - Referral to podiatry for trimming of his toenails -A1c in 3 months

## 2020-11-19 NOTE — Assessment & Plan Note (Addendum)
Blood pressure elevated with systolic in the 414Q.  His current regimen include amlodipine 10 mg, lisinopril 40 mg and chlorthalidone 12.5 mg.  Patient, however, is only taking amlodipine and lisinopril.  He has stopped chlorthalidone 3 months ago due to polyuria.  -Will resume chlorthalidone 12.5 mg a day. -Continue amlodipine and lisinopril.  Advised patient to pick up this medication from [facility] -BMP today -Return in 4 weeks for blood pressure recheck and another BMP  Addendum: BMP unremarkable. -Follow up in 4 weeks -Bmp at next visit

## 2020-11-19 NOTE — Patient Instructions (Addendum)
[name],  It was a pleasure seeing you in the clinic today.  Here is a summary what we talked about:  1.  High blood pressure: Your blood pressure is elevated today.  I sent the refill chlorthalidone to your pharmacy.  Please take half a tablet a day. Please also pick up a refill of your amlodipine and lisinopril from [facility].  I will check your blood work for kidney function today.  Please return in 1 month for blood pressure recheck and blood work.  2.  Type 2 diabetes: Your A1c has improved to 7.  I would not start any new medication today.  Please continue to work on diet and exercise.  I will recheck A1c in 3 months and decide if we need to start a new medication.  3.  High cholesterol level: I will check your lipid panel today.  Please pick up a refill of Lipitor at the pharmacy.  Please return in 1 month  Please schedule patient's appointment with me in December.  Take care,  Dr. Alfonse Spruce

## 2020-11-19 NOTE — Assessment & Plan Note (Addendum)
LDL 93 one year ago. Patient has history of CVA.  Patient reports adherence to Lipitor 80 mg however medication is not on the dispensed medication list.  Patient states that he just had a refill recently.  -Recheck lipid panel -Continue Lipitor 80 mg.  -Refill aspirin 81 mg -Referral to our clinical pharmacist for assistance with his medication reconciliation

## 2020-11-19 NOTE — Progress Notes (Signed)
   CC: Routine follow-up on diabetes and hypertension  HPI:  [name] is a 65 y.o. with past medical history of hypertension, type 2 diabetes, CVA who presents to the clinic today for routine follow-up of his hypertension and diabetes.  He has no acute complaints.  Please see problem based charting for detail  Past Medical History:  Diagnosis Date   CVA (cerebral vascular accident) ([place]) 12/2015   Diabetes ([place]) 2017   Hyperlipidemia    Hypertension    Review of Systems: Per HPI  Physical Exam:  Vitals:   11/19/20 1648  BP: (!) 196/88  Pulse: 66   Physical Exam Constitutional:      General: He is not in acute distress.    Appearance: He is not ill-appearing.  HENT:     Head: Normocephalic.  Eyes:     General:        Right eye: No discharge.        Left eye: No discharge.     Conjunctiva/sclera: Conjunctivae normal.  Cardiovascular:     Rate and Rhythm: Normal rate and regular rhythm.     Heart sounds: Normal heart sounds. No murmur heard. Pulmonary:     Effort: Pulmonary effort is normal. No respiratory distress.     Breath sounds: Normal breath sounds. No wheezing.  Musculoskeletal:        General: No deformity. Normal range of motion.     Cervical back: Normal range of motion.     Comments: No wounds or ulcers noted of bilateral feet.  Bilateral big toenails were long and needed trimming.  +2 pulses palpated.  The skin between the third fourth and fourth and fifth toes of left foot were mildly erythematous with no open skin.  Skin:    General: Skin is warm.     Coloration: Skin is not jaundiced.  Neurological:     Mental Status: He is alert and oriented to person, place, and time.  Psychiatric:        Mood and Affect: Mood normal.        Behavior: Behavior normal.     Assessment & Plan:   See Encounters Tab for problem based charting.  Patient discussed with Dr. Evette Doffing

## 2020-11-20 LAB — BMP8+ANION GAP
Anion Gap: 14 mmol/L (ref 10.0–18.0)
BUN/Creatinine Ratio: 15 (ref 10–24)
BUN: 17 mg/dL (ref 8–27)
CO2: 25 mmol/L (ref 20–29)
Calcium: 9.4 mg/dL (ref 8.6–10.2)
Chloride: 101 mmol/L (ref 96–106)
Creatinine, Ser: 1.11 mg/dL (ref 0.76–1.27)
Glucose: 118 mg/dL — ABNORMAL HIGH (ref 70–99)
Potassium: 3.7 mmol/L (ref 3.5–5.2)
Sodium: 140 mmol/L (ref 134–144)
eGFR: 74 mL/min/{1.73_m2} (ref >59)

## 2020-11-20 NOTE — Progress Notes (Signed)
Internal Medicine Clinic Attending  Case discussed with Dr. Nguyen  At the time of the visit.  We reviewed the resident's history and exam and pertinent patient test results.  I agree with the assessment, diagnosis, and plan of care documented in the resident's note. 

## 2020-12-11 ENCOUNTER — Ambulatory Visit (INDEPENDENT_AMBULATORY_CARE_PROVIDER_SITE_OTHER): Payer: Medicare HMO | Admitting: Pharmacist

## 2020-12-11 DIAGNOSIS — I10 Essential (primary) hypertension: Secondary | ICD-10-CM | POA: Diagnosis not present

## 2020-12-11 DIAGNOSIS — E119 Type 2 diabetes mellitus without complications: Secondary | ICD-10-CM | POA: Diagnosis not present

## 2020-12-11 MED ORDER — ATORVASTATIN CALCIUM 80 MG PO TABS: 80.0000 mg | ORAL_TABLET | Freq: Every day | ORAL | 0 refills | Status: DC

## 2020-12-11 NOTE — Patient Instructions (Signed)
[name] it was a pleasure seeing you today.   Today we reviewed all of the medications you are currently taking. Included is an updated medication list. Please continue taking all medications as prescribed on this list.  If you have any questions please call the clinic and ask to speak with me.  Follow-up with provider in a couple weeks.

## 2020-12-11 NOTE — Progress Notes (Signed)
   Subjective/    Patient ID: Seth [name], male    DOB: 08-02-1955, 65 y.o.   MRN: [id number]  HPI  Patient is a 65 y.o. male who presents for medication review and management. He is in good spirits and presents without assistance. Patient was referred and last seen by Primary Care Provider on 11/19/20.  Medication Adherence Questionnaire (A score of 2 or more points indicates risk for nonadherence)  Do you know what each of your medicines is for? 0 (1 point if no)  Do you ever have trouble remembering to take your medicine? 0 (2 points if yes)  Do you ever not take a medicine because you feel you do not need it?  0; has started taking again since OV on 11/19/20 but was not consistently taking medications before (1 point if yes)  Do you think that any of your medicines is not helping you? 0 (1 point if yes)  Do you have any physical problems such as vision loss that keep you from taking your medicines as prescribed?  0 (2 points if yes)  Do you think any of your medicine is causing a side effect? 1; increased urination with chlorthalidone (1 point if yes)  Do you know the names of ALL of your medicines? 0 (1 point if no)  Do you think that you need ALL of your medicines? 1 "not sure" (1 point if no)  In the past 6 months, have you missed getting a refill or a new prescription filled on time?  1 (1 point if yes)  How often do you miss taking a dose of medicine? 0 Never (0 points), 1 or 2 times a month (1 points), 1 time a week (2 points), 2 or more times a week (3 points).   TOTAL SCORE 3/14    Assessment/Plan:   Understanding of regimen: good  Understanding of indications: good  Potential of compliance: good  Patient has known adherence challenges based on score of 3 for questionnaire. Barriers include: lack of belief in necessity of treatment. Spent time reviewing indications for each medication and emphasizing importance. Medication list reviewed and updated. Patient was provided  with a printed medication list.   Follow-up appointment with PCP as needed. Written patient instructions provided.  This appointment required 30 minutes of direct patient care.  Thank you for involving pharmacy to assist in providing this patient's care.

## 2020-12-25 ENCOUNTER — Encounter: Payer: Medicare HMO | Admitting: Student

## 2020-12-31 ENCOUNTER — Ambulatory Visit (INDEPENDENT_AMBULATORY_CARE_PROVIDER_SITE_OTHER): Payer: Medicare HMO | Admitting: Student

## 2020-12-31 ENCOUNTER — Encounter: Payer: Self-pay | Admitting: Student

## 2020-12-31 ENCOUNTER — Other Ambulatory Visit: Payer: Self-pay

## 2020-12-31 VITALS — BP 159/98 | HR 65 | Temp 98.4°F | Resp 65 | Ht 73.0 in | Wt 173.3 lb

## 2020-12-31 DIAGNOSIS — Z8673 Personal history of transient ischemic attack (TIA), and cerebral infarction without residual deficits: Secondary | ICD-10-CM

## 2020-12-31 DIAGNOSIS — Z23 Encounter for immunization: Secondary | ICD-10-CM

## 2020-12-31 DIAGNOSIS — Z720 Tobacco use: Secondary | ICD-10-CM | POA: Diagnosis not present

## 2020-12-31 DIAGNOSIS — Z1211 Encounter for screening for malignant neoplasm of colon: Secondary | ICD-10-CM

## 2020-12-31 DIAGNOSIS — I10 Essential (primary) hypertension: Secondary | ICD-10-CM | POA: Diagnosis not present

## 2020-12-31 DIAGNOSIS — Z0000 Encounter for general adult medical examination without abnormal findings: Secondary | ICD-10-CM

## 2020-12-31 MED ORDER — CHLORTHALIDONE 25 MG PO TABS: 25.0000 mg | ORAL_TABLET | Freq: Every day | ORAL | 2 refills | Status: DC

## 2020-12-31 NOTE — Progress Notes (Signed)
   CC: follow up on blood pressure   HPI:  Seth [name] is a 65 y.o. with pathological history of hypertension, CVA who presented to clinic today for follow-up on his blood pressure.  Please see problem based charting for detail  Past Medical History:  Diagnosis Date   CVA (cerebral vascular accident) ([place]) 12/2015   Diabetes ([place]) 2017   Hyperlipidemia    Hypertension    Review of Systems:  per HPI  Physical Exam:  Vitals:   12/31/20 1314  BP: (!) 159/98  Pulse: 65  Resp: (!) 65  Temp: 98.4 F (36.9 C)  TempSrc: Oral  SpO2: 100%  Weight: 173 lb 4.8 oz (78.6 kg)  Height: 6\' 1"  (1.854 m)   Physical Exam Constitutional:      General: He is not in acute distress.    Appearance: He is not ill-appearing or toxic-appearing.  HENT:     Head: Normocephalic.  Eyes:     General:        Right eye: No discharge.        Left eye: No discharge.     Conjunctiva/sclera: Conjunctivae normal.  Cardiovascular:     Rate and Rhythm: Normal rate and regular rhythm.     Heart sounds: Normal heart sounds. No murmur heard.    Comments: No LE edema  Pulmonary:     Effort: Pulmonary effort is normal. No respiratory distress.     Breath sounds: No wheezing.  Abdominal:     General: Bowel sounds are normal. There is no distension.     Palpations: Abdomen is soft.  Musculoskeletal:        General: Normal range of motion.  Skin:    General: Skin is warm and dry.     Coloration: Skin is not jaundiced.  Neurological:     General: No focal deficit present.     Mental Status: He is alert and oriented to person, place, and time.  Psychiatric:        Mood and Affect: Mood normal.        Behavior: Behavior normal.     Assessment & Plan:   See Encounters Tab for problem based charting.  Patient discussed with Dr. Dareen Piano

## 2020-12-31 NOTE — Assessment & Plan Note (Addendum)
Patient report adherence to Lipitor 80 mg and aspirin 81 mg.  Last LDL was 93 in 2021.  -Repeat lipid panel today.  If LDL not at goal, will consider adding Zetia.  Addendum LDL elevated at 130.  Patient reports adherence to his Lipitor.  He just picked up his refill in November.  -Will add Zetia 10 mg to his regimen -Repeat lipid panel in 3 months.  If LDL not at goal, consider PCSK9

## 2020-12-31 NOTE — Assessment & Plan Note (Addendum)
Blood pressure uncontrolled today at 159/98.  He report adherence to amlodipine 10 mg, lisinopril 40 mg and chlorthalidone 12.5 mg.  Given that his blood pressure is uncontrolled on 3 medications, will initiate work-up for secondary hypertension.  -Order Aldo/renin -Increase chlorthalidone to 25 mg daily -Continue amlodipine and lisinopril -Follow-up in 2 weeks for BMP and blood pressure recheck.  His last potassium 3.7, suspect he will need potassium supplement at next visit when we increase his chlorthalidone.  Addendum Renin/aldo unremarkable.  If BP still uncontrolled at follow-up, will get renal artery ultrasound

## 2020-12-31 NOTE — Assessment & Plan Note (Signed)
-  GI referral for colonoscopy -Pneumonia shot given today -Patient report a flu shot this October at [facility].

## 2020-12-31 NOTE — Patient Instructions (Addendum)
[name],  It was a pleasure seeing you in the clinic today.  I am glad you are doing well.  Here is a summary of what we talked about:  1.  High blood pressure: Your blood pressure is elevated today at 159/98.  I will increase the chlorthalidone from half a tablet to 1 tablet 25 mg daily.  Please continue amlodipine and lisinopril.  Given that your blood pressure is not controlled on 3 medication, I will check some blood work to rule out other causes of your hypertension.    2.  High cholesterol: I will repeat your lipid panel today  3.  I placed a referral for colonoscopy.  Please be on the look out for the call.  Please come back in 2 weeks for blood work to check on your kidney function and potassium.  Take care,  Dr. Alfonse Spruce

## 2020-12-31 NOTE — Assessment & Plan Note (Signed)
He reports smoking a pack a week.  I advised him on the risk of smoking and CVA.  Patient states that he will try to cut back.  I offer resources and medication such as nicotine patch but he declined.  -Continue smoking cessation conversation next visit.

## 2021-01-01 NOTE — Progress Notes (Signed)
Internal Medicine Clinic Attending  Case discussed with Dr. Nguyen  At the time of the visit.  We reviewed the resident's history and exam and pertinent patient test results.  I agree with the assessment, diagnosis, and plan of care documented in the resident's note. 

## 2021-01-06 LAB — ALDOSTERONE + RENIN ACTIVITY W/ RATIO
ALDOS/RENIN RATIO: 9.4 (ref 0.0–30.0)
ALDOSTERONE: 6.7 ng/dL (ref 0.0–30.0)
Renin: 0.714 ng/mL/hr (ref 0.167–5.380)

## 2021-01-06 LAB — LIPID PANEL
Chol/HDL Ratio: 3.8 ratio (ref 0.0–5.0)
Cholesterol, Total: 201 mg/dL — ABNORMAL HIGH (ref 100–199)
HDL: 53 mg/dL (ref >39)
LDL Chol Calc (NIH): 130 mg/dL — ABNORMAL HIGH (ref 0–99)
Triglycerides: 103 mg/dL (ref 0–149)
VLDL Cholesterol Cal: 18 mg/dL (ref 5–40)

## 2021-01-07 MED ORDER — EZETIMIBE 10 MG PO TABS: 10.0000 mg | ORAL_TABLET | Freq: Every day | ORAL | 11 refills | Status: DC

## 2021-01-07 NOTE — Addendum Note (Signed)
Addended byGaylan Gerold on: 01/07/2021 05:47 PM   Modules accepted: Orders

## 2021-01-08 ENCOUNTER — Other Ambulatory Visit: Payer: Self-pay | Admitting: Internal Medicine

## 2021-01-08 ENCOUNTER — Encounter: Payer: Self-pay | Admitting: Podiatry

## 2021-01-08 ENCOUNTER — Other Ambulatory Visit: Payer: Self-pay

## 2021-01-08 ENCOUNTER — Ambulatory Visit: Payer: Medicare HMO | Admitting: Podiatry

## 2021-01-08 DIAGNOSIS — L84 Corns and callosities: Secondary | ICD-10-CM | POA: Diagnosis not present

## 2021-01-08 DIAGNOSIS — M79674 Pain in right toe(s): Secondary | ICD-10-CM | POA: Diagnosis not present

## 2021-01-08 DIAGNOSIS — B351 Tinea unguium: Secondary | ICD-10-CM

## 2021-01-08 DIAGNOSIS — M79675 Pain in left toe(s): Secondary | ICD-10-CM | POA: Diagnosis not present

## 2021-01-08 DIAGNOSIS — I10 Essential (primary) hypertension: Secondary | ICD-10-CM

## 2021-01-10 NOTE — Progress Notes (Signed)
Subjective:   Patient ID: Seth [name], male   DOB: 65 y.o.   MRN: [id number]   HPI Patient presents for long-term diabetic with lesion formation plantar right x2 painful when pressed and thick yellow brittle nailbeds 1-5 both feet that he cannot cut himself.  He presents also with caregiver and they are not able to take care of this and would like to have this taken care of at patient does not smoke likes to be active   Review of Systems  All other systems reviewed and are negative.      Objective:  Physical Exam Vitals and nursing note reviewed.  Constitutional:      Appearance: He is well-developed.  Pulmonary:     Effort: Pulmonary effort is normal.  Musculoskeletal:        General: Normal range of motion.  Skin:    General: Skin is warm.  Neurological:     Mental Status: He is alert.    Vascular status found to be mildly diminished with pulses that are palpable but but weak with patient noted to have diminishment of sharp dull vibratory bilateral.  Patient does have long-term diabetes but under good control and diminished range of motion of the subtalar midtarsal joint.  I did note lesions plantar right foot first and fifth metatarsal painful when pressed and thick yellow brittle nailbeds 1-5 both feet that are painful when pressed dorsally.  Does have good digital perfusion well oriented     Assessment:  At risk diabetic with chronic mycotic nail infection and pain 1-5 both feet and lesions right painful     Plan:  H&P reviewed condition and did diabetic exam and education for patient instructed on daily foot inspections.  Debrided nailbeds 1-5 both feet no iatrogenic bleeding and debrided lesions no iatrogenic bleeding and will be seen back to recheck signed this

## 2021-01-29 ENCOUNTER — Ambulatory Visit (INDEPENDENT_AMBULATORY_CARE_PROVIDER_SITE_OTHER): Payer: Medicare HMO | Admitting: Internal Medicine

## 2021-01-29 VITALS — BP 119/69 | HR 80 | Temp 98.5°F | Ht 73.0 in | Wt 170.2 lb

## 2021-01-29 DIAGNOSIS — E119 Type 2 diabetes mellitus without complications: Secondary | ICD-10-CM

## 2021-01-29 DIAGNOSIS — I10 Essential (primary) hypertension: Secondary | ICD-10-CM

## 2021-01-29 MED ORDER — LISINOPRIL 40 MG PO TABS: 40.0000 mg | ORAL_TABLET | Freq: Every day | ORAL | 2 refills | Status: DC

## 2021-01-29 NOTE — Patient Instructions (Addendum)
It was a pleasure meeting you today! Early congratulations on your retirement!  Your blood pressure looks excellent! Keep taking your medications as prescribed and we will see you in about 3 months.

## 2021-01-29 NOTE — Assessment & Plan Note (Signed)
Referral to opthalmology placed 

## 2021-01-29 NOTE — Progress Notes (Signed)
° °  CC: BP check  HPI:  [name] is a 66 y.o. with a PMHx listed below here for a blood pressure check. For details of today's visit and the status of his chronic medical issues please refer to the assessment and plan.   Past Medical History:  Diagnosis Date   CVA (cerebral vascular accident) ([place]) 12/2015   Diabetes ([place]) 2017   Hyperlipidemia    Hypertension    Review of Systems:   Review of Systems  Respiratory:  Negative for shortness of breath.   Cardiovascular:  Negative for chest pain and leg swelling.  Genitourinary:  Positive for frequency. Negative for dysuria, hematuria and urgency.    Physical Exam:  Vitals:   01/29/21 1523  BP: 119/69  Pulse: 80  Temp: 98.5 F (36.9 C)  TempSrc: Oral  SpO2: 99%  Weight: 170 lb 3.2 oz (77.2 kg)  Height: 6\' 1"  (1.854 m)   Physical Exam General: alert, appears stated age, in no acute distress HEENT: Normocephalic, atraumatic, EOM intact, conjunctiva normal CV: Regular rate and rhythm, no murmurs rubs or gallops Pulm: Clear to auscultation bilaterally, normal work of breathing Abdomen: Soft, nondistended, bowel sounds present, no tenderness to palpation MSK: No lower extremity edema Skin: Warm and dry Neuro: Alert and oriented x3   Assessment & Plan:   See Encounters Tab for problem based charting.  Patient discussed with Dr.  Saverio Danker

## 2021-01-29 NOTE — Assessment & Plan Note (Addendum)
Vitals:   01/29/21 1523  BP: 119/69  Pulse: 80  Temp: 98.5 F (36.9 C)  SpO2: 99%   BP at goal today. He has noticed increase in frequency of urination with increased dose of chlorthalidone. Denies any other side effects and otherwise tolerating the medication well. Continue lisinopril 40 mg, amlodipine 10 mg and chlorthalidone 25 mg daily. Will check a BMP today.

## 2021-01-30 LAB — BMP8+ANION GAP
Anion Gap: 14 mmol/L (ref 10.0–18.0)
BUN/Creatinine Ratio: 21 (ref 10–24)
BUN: 25 mg/dL (ref 8–27)
CO2: 26 mmol/L (ref 20–29)
Calcium: 10 mg/dL (ref 8.6–10.2)
Chloride: 98 mmol/L (ref 96–106)
Creatinine, Ser: 1.2 mg/dL (ref 0.76–1.27)
Glucose: 218 mg/dL — ABNORMAL HIGH (ref 70–99)
Potassium: 3.9 mmol/L (ref 3.5–5.2)
Sodium: 138 mmol/L (ref 134–144)
eGFR: 67 mL/min/{1.73_m2} (ref >59)

## 2021-01-30 NOTE — Progress Notes (Signed)
Internal Medicine Clinic Attending ° °Case discussed with Dr. Rehman  At the time of the visit.  We reviewed the resident’s history and exam and pertinent patient test results.  I agree with the assessment, diagnosis, and plan of care documented in the resident’s note.  ° °

## 2021-03-13 DIAGNOSIS — E119 Type 2 diabetes mellitus without complications: Secondary | ICD-10-CM | POA: Diagnosis not present

## 2021-03-13 DIAGNOSIS — H2513 Age-related nuclear cataract, bilateral: Secondary | ICD-10-CM | POA: Diagnosis not present

## 2021-03-13 LAB — HM DIABETES EYE EXAM

## 2021-03-31 ENCOUNTER — Other Ambulatory Visit: Payer: Self-pay | Admitting: Student

## 2021-03-31 DIAGNOSIS — I10 Essential (primary) hypertension: Secondary | ICD-10-CM

## 2021-04-02 ENCOUNTER — Encounter: Payer: Self-pay | Admitting: Dietician

## 2021-04-04 ENCOUNTER — Other Ambulatory Visit: Payer: Self-pay | Admitting: Internal Medicine

## 2021-04-04 DIAGNOSIS — I10 Essential (primary) hypertension: Secondary | ICD-10-CM

## 2021-05-12 ENCOUNTER — Encounter: Payer: Self-pay | Admitting: Gastroenterology

## 2021-05-19 ENCOUNTER — Telehealth: Payer: Self-pay | Admitting: *Deleted

## 2021-05-19 NOTE — Telephone Encounter (Signed)
Patient was rescheduled for tomorrow at 4:00 ?

## 2021-05-19 NOTE — Telephone Encounter (Signed)
Unable to reach pt after multiple phone calls. Messages left, last message informed pt we would need him to call by 5pm today to reschedule PV, otherwise colon would be cancelled. ?

## 2021-05-20 ENCOUNTER — Ambulatory Visit (AMBULATORY_SURGERY_CENTER): Payer: Medicare HMO | Admitting: *Deleted

## 2021-05-20 VITALS — Ht 73.0 in | Wt 163.0 lb

## 2021-05-20 DIAGNOSIS — Z86010 Personal history of colonic polyps: Secondary | ICD-10-CM

## 2021-05-20 MED ORDER — PEG 3350-KCL-NA BICARB-NACL 420 G PO SOLR: 4000.0000 mL | Freq: Once | ORAL | 0 refills | Status: AC

## 2021-05-20 NOTE — Progress Notes (Signed)
No egg or soy allergy known to patient  ?No issues known to pt with past sedation with any surgeries or procedures ?Patient denies ever being told they had issues or difficulty with intubation  ?No FH of Malignant Hyperthermia ?Pt is not on diet pills ?Pt is not on  home 02  ?Pt is not on blood thinners  ?Pt denies issues with constipation  ?No A fib or A flutter ? ? ?PV completed over the phone. Pt verified name, DOB, address and insurance during PV today.  ?Pt mailed instruction packet with copy of consent form to read and not return, and instructions.  ?Pt encouraged to call with questions or issues.  ?If pt has My chart, procedure instructions sent via My Chart  ?Insurance confirmed with pt at PV today   ?

## 2021-06-10 ENCOUNTER — Encounter: Payer: Self-pay | Admitting: Gastroenterology

## 2021-06-16 ENCOUNTER — Ambulatory Visit (AMBULATORY_SURGERY_CENTER): Payer: Medicare HMO | Admitting: Gastroenterology

## 2021-06-16 ENCOUNTER — Encounter: Payer: Self-pay | Admitting: Gastroenterology

## 2021-06-16 VITALS — BP 141/81 | HR 52 | Temp 97.3°F | Resp 12 | Ht 73.0 in | Wt 163.0 lb

## 2021-06-16 DIAGNOSIS — E119 Type 2 diabetes mellitus without complications: Secondary | ICD-10-CM | POA: Diagnosis not present

## 2021-06-16 DIAGNOSIS — Z86010 Personal history of colonic polyps: Secondary | ICD-10-CM | POA: Diagnosis not present

## 2021-06-16 DIAGNOSIS — E785 Hyperlipidemia, unspecified: Secondary | ICD-10-CM | POA: Diagnosis not present

## 2021-06-16 DIAGNOSIS — I10 Essential (primary) hypertension: Secondary | ICD-10-CM | POA: Diagnosis not present

## 2021-06-16 MED ORDER — SODIUM CHLORIDE 0.9 % IV SOLN: 500.0000 mL | Freq: Once | INTRAVENOUS | Status: DC

## 2021-06-16 NOTE — Op Note (Signed)
[place] Patient Name: Seth [name] Procedure Date: 06/16/2021 11:13 AM MRN: [id number] Endoscopist: Mallie Mussel L. Loletha Carrow , MD Age: 66 Referring MD:  Date of Birth: [birth year] Gender: Male Account #: [id number] Procedure:                Colonoscopy Indications:              Surveillance: Personal history of adenomatous                            polyps on last colonoscopy > 3 years ago                           8-46m splenic flexure TA on patient's first                            colonoscopy Feb 2019 Medicines:                Monitored Anesthesia Care Procedure:                Pre-Anesthesia Assessment:                           - Prior to the procedure, a History and Physical                            was performed, and patient medications and                            allergies were reviewed. The patient's tolerance of                            previous anesthesia was also reviewed. The risks                            and benefits of the procedure and the sedation                            options and risks were discussed with the patient.                            All questions were answered, and informed consent                            was obtained. Prior Anticoagulants: The patient has                            taken no previous anticoagulant or antiplatelet                            agents. ASA Grade Assessment: II - A patient with                            mild systemic disease. After reviewing the risks  and benefits, the patient was deemed in                            satisfactory condition to undergo the procedure.                           After obtaining informed consent, the colonoscope                            was passed under direct vision. Throughout the                            procedure, the patient's blood pressure, pulse, and                            oxygen saturations were monitored continuously. The                             CF [id number] #[id number] was introduced through the anus                            and advanced to the the cecum, identified by                            appendiceal orifice and ileocecal valve. The                            colonoscopy was performed without difficulty. The                            patient tolerated the procedure well. The quality                            of the bowel preparation was excellent. The                            ileocecal valve, appendiceal orifice, and rectum                            were photographed. Scope In: 11:25:30 AM Scope Out: 11:37:19 AM Scope Withdrawal Time: 0 hours 9 minutes 38 seconds  Total Procedure Duration: 0 hours 11 minutes 49 seconds  Findings:                 The perianal and digital rectal examinations were                            normal.                           Repeat examination of right colon under NBI                            performed.  A few small-mouthed diverticula were found in the                            left colon.                           There is no endoscopic evidence of polyps in the                            entire colon.                           Internal hemorrhoids were found. The hemorrhoids                            were large.                           The exam was otherwise without abnormality on                            direct and retroflexion views. Complications:            No immediate complications. Estimated Blood Loss:     Estimated blood loss: none. Impression:               - Diverticulosis in the left colon.                           - Internal hemorrhoids.                           - The examination was otherwise normal on direct                            and retroflexion views.                           - No specimens collected. Recommendation:           - Patient has a contact number available for                             emergencies. The signs and symptoms of potential                            delayed complications were discussed with the                            patient. Return to normal activities tomorrow.                            Written discharge instructions were provided to the                            patient.                           -  Resume previous diet.                           - Continue present medications.                           - Repeat colonoscopy in 7 years for surveillance. Dirk Vanaman L. Loletha Carrow, MD 06/16/2021 11:42:44 AM This report has been signed electronically.

## 2021-06-16 NOTE — Progress Notes (Signed)
To pacu, VSS. Report to rn.tb °

## 2021-06-16 NOTE — Progress Notes (Signed)
History and Physical:  This patient presents for endoscopic testing for: Encounter Diagnosis  Name Primary?   Personal history of colonic polyps Yes    This patient presents for surveillance colonoscopy. An 8 to 10 mm splenic flexure tubular adenoma was removed on his for screening colonoscopy in February 2019. Patient denies chronic abdominal pain, rectal bleeding, constipation or diarrhea.   Patient is otherwise without complaints or active issues today.   Past Medical History: Past Medical History:  Diagnosis Date   CVA (cerebral vascular accident) ([place]) 12/2015   Diabetes ([place]) 2017   Hyperlipidemia    Hypertension      Past Surgical History: Past Surgical History:  Procedure Laterality Date   COLONOSCOPY  2019   CYST REMOVAL LEG     INGUINAL HERNIA REPAIR Right 09/13/2018   Procedure: RIGHT INGUINAL HERNIA REPAIR WITH MESH;  Surgeon: Donnie Mesa, MD;  Location: [facility];  Service: General;  Laterality: Right;  GENERAL AND TAP BLOCK    Allergies: No Known Allergies  Outpatient Meds: Current Outpatient Medications  Medication Sig Dispense Refill   acetaminophen (TYLENOL) 325 MG tablet Take 650 mg by mouth every 6 (six) hours as needed.     amLODipine (NORVASC) 10 MG tablet TAKE 1 TABLET BY MOUTH EVERY DAY 90 tablet 2   aspirin (ASPIRIN LOW DOSE) 81 MG EC tablet Take 1 tablet (81 mg total) by mouth daily. SWALLOW WHOLE. 90 tablet 2   atorvastatin (LIPITOR) 80 MG tablet Take 1 tablet (80 mg total) by mouth daily at 6 PM. 90 tablet 0   chlorthalidone (HYGROTON) 25 MG tablet TAKE 1 TABLET (25 MG TOTAL) BY MOUTH DAILY. 90 tablet 2   lisinopril (ZESTRIL) 40 MG tablet Take 1 tablet (40 mg total) by mouth daily. 90 tablet 2   ezetimibe (ZETIA) 10 MG tablet Take 1 tablet (10 mg total) by mouth daily. (Patient not taking: Reported on 05/20/2021) 30 tablet 11   glucose blood (ACCU-CHEK GUIDE) test strip Use as instructed 100 each 12   tadalafil (CIALIS) 10 MG tablet Take 1  tablet (10 mg total) by mouth as needed for erectile dysfunction. 20 tablet 1   Current Facility-Administered Medications  Medication Dose Route Frequency Provider Last Rate Last Admin   0.9 %  sodium chloride infusion  500 mL Intravenous Once Danis, Estill Cotta III, MD          ___________________________________________________________________ Objective   Exam:  BP 124/84   Pulse 60   Temp (!) 97.3 F (36.3 C) (Temporal)   Ht '6\' 1"'$  (1.854 m)   Wt 163 lb (73.9 kg)   SpO2 99%   BMI 21.51 kg/m   CV: RRR without murmur, S1/S2 Resp: clear to auscultation bilaterally, normal RR and effort noted GI: soft, no tenderness, with active bowel sounds.   Assessment: Encounter Diagnosis  Name Primary?   Personal history of colonic polyps Yes     Plan: Colonoscopy  The benefits and risks of the planned procedure were described in detail with the patient or (when appropriate) their health care proxy.  Risks were outlined as including, but not limited to, bleeding, infection, perforation, adverse medication reaction leading to cardiac or pulmonary decompensation, pancreatitis (if ERCP).  The limitation of incomplete mucosal visualization was also discussed.  No guarantees or warranties were given.    The patient is appropriate for an endoscopic procedure in the ambulatory setting.   - Wilfrid Lund, MD

## 2021-06-16 NOTE — Progress Notes (Signed)
VS completed by DT.  Pt's states no medical or surgical changes since previsit or office visit.  

## 2021-06-16 NOTE — Patient Instructions (Signed)
YOU HAD AN ENDOSCOPIC PROCEDURE TODAY AT [facility]:   Refer to the procedure report that was given to you for any specific questions about what was found during the examination.  If the procedure report does not answer your questions, please call your gastroenterologist to clarify.  If you requested that your care partner not be given the details of your procedure findings, then the procedure report has been included in a sealed envelope for you to review at your convenience later.  **Handout given on hemorrhoids**  YOU SHOULD EXPECT: Some feelings of bloating in the abdomen. Passage of more gas than usual.  Walking can help get rid of the air that was put into your GI tract during the procedure and reduce the bloating. If you had a lower endoscopy (such as a colonoscopy or flexible sigmoidoscopy) you may notice spotting of blood in your stool or on the toilet paper. If you underwent a bowel prep for your procedure, you may not have a normal bowel movement for a few days.  Please Note:  You might notice some irritation and congestion in your nose or some drainage.  This is from the oxygen used during your procedure.  There is no need for concern and it should clear up in a day or so.  SYMPTOMS TO REPORT IMMEDIATELY:  Following lower endoscopy (colonoscopy or flexible sigmoidoscopy):  Excessive amounts of blood in the stool  Significant tenderness or worsening of abdominal pains  Swelling of the abdomen that is new, acute  Fever of 100F or higher   For urgent or emergent issues, a gastroenterologist can be reached at any hour by calling [phone]. Do not use MyChart messaging for urgent concerns.    DIET:  We do recommend a small meal at first, but then you may proceed to your regular diet.  Drink plenty of fluids but you should avoid alcoholic beverages for 24 hours.  ACTIVITY:  You should plan to take it easy for the rest of today and you should NOT DRIVE or use heavy  machinery until tomorrow (because of the sedation medicines used during the test).    FOLLOW UP: Our staff will call the number listed on your records 48-72 hours following your procedure to check on you and address any questions or concerns that you may have regarding the information given to you following your procedure. If we do not reach you, we will leave a message.  We will attempt to reach you two times.  During this call, we will ask if you have developed any symptoms of COVID 19. If you develop any symptoms (ie: fever, flu-like symptoms, shortness of breath, cough etc.) before then, please call [phone].  If you test positive for Covid 19 in the 2 weeks post procedure, please call and report this information to [place].    If any biopsies were taken you will be contacted by phone or by letter within the next 1-3 weeks.  Please call us at [phone] if you have not heard about the biopsies in 3 weeks.    SIGNATURES/CONFIDENTIALITY: You and/or your care partner have signed paperwork which will be entered into your electronic medical record.  These signatures attest to the fact that that the information above on your After Visit Summary has been reviewed and is understood.  Full responsibility of the confidentiality of this discharge information lies with you and/or your care-partner.

## 2021-06-17 ENCOUNTER — Telehealth: Payer: Self-pay | Admitting: *Deleted

## 2021-06-17 ENCOUNTER — Telehealth: Payer: Self-pay | Admitting: Gastroenterology

## 2021-06-17 ENCOUNTER — Telehealth: Payer: Self-pay

## 2021-06-17 NOTE — Telephone Encounter (Signed)
First attempt, left VM.  

## 2021-06-17 NOTE — Telephone Encounter (Signed)
Patient returned the call stated he is well no questions at this time.

## 2021-06-17 NOTE — Telephone Encounter (Signed)
F/U call placed, VM obtained and message left. 2nd attempt SChaplin, RN,BSN

## 2021-08-09 ENCOUNTER — Other Ambulatory Visit: Payer: Self-pay | Admitting: Internal Medicine

## 2021-08-09 DIAGNOSIS — I10 Essential (primary) hypertension: Secondary | ICD-10-CM

## 2021-08-10 NOTE — Telephone Encounter (Signed)
LOV 01/29/21. Called pt to schedule f/u appt - no answer. Left message to call our office back to schedule.

## 2021-08-21 ENCOUNTER — Other Ambulatory Visit: Payer: Self-pay

## 2021-08-21 ENCOUNTER — Encounter: Payer: Self-pay | Admitting: Internal Medicine

## 2021-08-21 ENCOUNTER — Ambulatory Visit (INDEPENDENT_AMBULATORY_CARE_PROVIDER_SITE_OTHER): Payer: Medicare HMO | Admitting: Internal Medicine

## 2021-08-21 VITALS — BP 142/82 | HR 66 | Temp 97.9°F | Ht 73.0 in | Wt 169.0 lb

## 2021-08-21 DIAGNOSIS — E119 Type 2 diabetes mellitus without complications: Secondary | ICD-10-CM

## 2021-08-21 DIAGNOSIS — Z7984 Long term (current) use of oral hypoglycemic drugs: Secondary | ICD-10-CM | POA: Diagnosis not present

## 2021-08-21 DIAGNOSIS — Z8673 Personal history of transient ischemic attack (TIA), and cerebral infarction without residual deficits: Secondary | ICD-10-CM

## 2021-08-21 DIAGNOSIS — F17210 Nicotine dependence, cigarettes, uncomplicated: Secondary | ICD-10-CM | POA: Diagnosis not present

## 2021-08-21 DIAGNOSIS — I10 Essential (primary) hypertension: Secondary | ICD-10-CM

## 2021-08-21 DIAGNOSIS — Z720 Tobacco use: Secondary | ICD-10-CM

## 2021-08-21 DIAGNOSIS — Z0000 Encounter for general adult medical examination without abnormal findings: Secondary | ICD-10-CM

## 2021-08-21 LAB — POCT GLYCOSYLATED HEMOGLOBIN (HGB A1C): Hemoglobin A1C: 8.6 % — AB (ref 4.0–5.6)

## 2021-08-21 LAB — GLUCOSE, CAPILLARY: Glucose-Capillary: 116 mg/dL — ABNORMAL HIGH (ref 70–99)

## 2021-08-21 MED ORDER — CHLORTHALIDONE 25 MG PO TABS
12.5000 mg | ORAL_TABLET | Freq: Every day | ORAL | 1 refills | Status: DC
Start: 2021-08-21 — End: 2022-02-25

## 2021-08-21 MED ORDER — ATORVASTATIN CALCIUM 80 MG PO TABS: 80.0000 mg | ORAL_TABLET | Freq: Every day | ORAL | 0 refills | Status: DC

## 2021-08-21 MED ORDER — SITAGLIPTIN PHOSPHATE 50 MG PO TABS: 50.0000 mg | ORAL_TABLET | Freq: Every day | ORAL | 3 refills | Status: DC

## 2021-08-21 NOTE — Progress Notes (Signed)
   CC: routine visit  HPI:  [name] is a 66 y.o. male with hypertension, diabetes, and previous CVA who presents to the [facility] for a routine visit. Please see problem-based list for further details, assessments, and plans.   Past Medical History:  Diagnosis Date   CVA (cerebral vascular accident) ([place]) 12/2015   Diabetes ([place]) 2017   Hyperlipidemia    Hypertension    Review of Systems:  Review of Systems  Constitutional: Negative.   HENT: Negative.    Respiratory:  Negative for cough and shortness of breath.   Cardiovascular:  Negative for chest pain and palpitations.  Gastrointestinal: Negative.   Neurological:  Negative for dizziness and headaches.     Physical Exam:  Vitals:   08/21/21 1056 08/21/21 1101  BP: (!) 147/85 (!) 142/82  Pulse: 70 66  Temp: 97.9 F (36.6 C)   TempSrc: Oral   SpO2: 100%   Weight: 169 lb (76.7 kg)   Height: '6\' 1"'$  (1.854 m)    General: Pleasant, well-appearing male. No acute distress. CV: RRR. No murmurs. No LE edema Pulmonary: Lungs CTAB. Normal effort.  Abdominal: Soft, nontender, nondistended. Normal bowel sounds. Extremities: Normal ROM. Skin: Warm and dry.  Neuro: A&Ox3. No focal deficit. Psych: Normal mood and affect   Assessment & Plan:   See Encounters Tab for problem based charting.  Patient discussed with Dr.  Cain Sieve  HTN (hypertension) BP Readings from Last 3 Encounters:  08/21/21 (!) 142/82  06/16/21 (!) 141/81  01/29/21 119/69   Initial BP elevated to 147/85 today and upon recheck, blood pressure is 142/82.  The patient is on Lisinopril 40 mg daily, amlodipine 10 mg daily, and chlorthalidone 25 mg daily, however, he states that he has not been taking his chlorthalidone as it makes him urinate quite frequently and give some troubles while he is at work.  With the patient's blood pressure still being above goal, we discussed initiating chlorthalidone again at half its dose to see if the patient tolerates  this better.  Patient is in agreement with this plan.  Plan: - Continue lisinopril 40 mg, amlodipine 10 mg - Restart chlorthalidone at 12.5 mg daily - BMP today  Diabetes ([place]) The patient's A1c has increased from 7% to 8.6% today.  He has previously been off of all meds has been managing his diabetes with diet alone, however, today we discussed initiating medication to help get his A1c back to goal. He denies any polyuria, polydipsia, blurry vision, fatigue, or weight loss.  Plan: - Restart Januvia 50 mg daily  - Repeatp A1c in 3 months  History of CVA (cerebrovascular accident) Patient continues to report adherence to Lipitor 80 mg daily and aspirin 81 mg daily.  He was prescribed Zetia however he states that he has not been taking this regularly as he thought it was a water pill.  Plan: - Continue Lipitor 80 mg daily, aspirin 81 mg daily, and advised patient to restart Zetia 10 mg daily  Health care maintenance Advised patient to get Shingrix vaccine at local pharmacy  Tobacco abuse Patient continues to smoke about 1 pack of cigarettes per week (approximately 3 cigarettes/day).  I counseled the patient on the importance of smoking cessation and he states that he will try to cut back.

## 2021-08-21 NOTE — Assessment & Plan Note (Signed)
Patient continues to smoke about 1 pack of cigarettes per week (approximately 3 cigarettes/day).  I counseled the patient on the importance of smoking cessation and he states that he will try to cut back.

## 2021-08-21 NOTE — Patient Instructions (Signed)
Thank you, Mr.Labaron A Nonaka for allowing [place] to provide your care today. Today we discussed:  High blood pressure: Keep taking lisinopril 40 mg everyday, amlodipine 10 mg everyday, and take HALF a tablet of chlorthalidone everyday. We are also checking some lab work today  Diabetes: Start taking Januvia 50 mg once a day  Cholesterol: Take atorvastatin 80 mg and ezetimibe 10 mg everyday  Shingles vaccine: You can get this at your local pharmacy  I have ordered the following labs for you:  Lab Orders         Glucose, capillary         BMP8+Anion Gap         POC Hbg A1C        Referrals ordered today:   Referral Orders  No referral(s) requested today     I have ordered the following medication/changed the following medications:   Stop the following medications: Medications Discontinued During This Encounter  Medication Reason   atorvastatin (LIPITOR) 80 MG tablet Reorder   chlorthalidone (HYGROTON) 25 MG tablet Reorder     Start the following medications: Meds ordered this encounter  Medications   atorvastatin (LIPITOR) 80 MG tablet    Sig: Take 1 tablet (80 mg total) by mouth daily at 6 PM.    Dispense:  90 tablet    Refill:  0   chlorthalidone (HYGROTON) 25 MG tablet    Sig: Take 0.5 tablets (12.5 mg total) by mouth daily.    Dispense:  45 tablet    Refill:  1   sitaGLIPtin (JANUVIA) 50 MG tablet    Sig: Take 1 tablet (50 mg total) by mouth daily.    Dispense:  30 tablet    Refill:  3     Follow up: 3 months for diabetes    Should you have any questions or concerns please call the internal medicine clinic at [phone].     Buddy Duty, D.O. [place]

## 2021-08-21 NOTE — Assessment & Plan Note (Signed)
Advised patient to get Shingrix vaccine at local pharmacy

## 2021-08-21 NOTE — Assessment & Plan Note (Signed)
BP Readings from Last 3 Encounters:  08/21/21 (!) 142/82  06/16/21 (!) 141/81  01/29/21 119/69   Initial BP elevated to 147/85 today and upon recheck, blood pressure is 142/82.  The patient is on Lisinopril 40 mg daily, amlodipine 10 mg daily, and chlorthalidone 25 mg daily, however, he states that he has not been taking his chlorthalidone as it makes him urinate quite frequently and give some troubles while he is at work.  With the patient's blood pressure still being above goal, we discussed initiating chlorthalidone again at half its dose to see if the patient tolerates this better.  Patient is in agreement with this plan.  Plan: - Continue lisinopril 40 mg, amlodipine 10 mg - Restart chlorthalidone at 12.5 mg daily - BMP today

## 2021-08-21 NOTE — Assessment & Plan Note (Addendum)
Patient continues to report adherence to Lipitor 80 mg daily and aspirin 81 mg daily.  He was prescribed Zetia however he states that he has not been taking this regularly as he thought it was a water pill.  Plan: - Continue Lipitor 80 mg daily, aspirin 81 mg daily, and advised patient to restart Zetia 10 mg daily

## 2021-08-21 NOTE — Assessment & Plan Note (Signed)
The patient's A1c has increased from 7% to 8.6% today.  He has previously been off of all meds has been managing his diabetes with diet alone, however, today we discussed initiating medication to help get his A1c back to goal. He denies any polyuria, polydipsia, blurry vision, fatigue, or weight loss.  Plan: - Restart Januvia 50 mg daily  - Repeatp A1c in 3 months

## 2021-08-22 LAB — BMP8+ANION GAP
Anion Gap: 17 mmol/L (ref 10.0–18.0)
BUN/Creatinine Ratio: 15 (ref 10–24)
BUN: 13 mg/dL (ref 8–27)
CO2: 18 mmol/L — ABNORMAL LOW (ref 20–29)
Calcium: 9 mg/dL (ref 8.6–10.2)
Chloride: 103 mmol/L (ref 96–106)
Creatinine, Ser: 0.86 mg/dL (ref 0.76–1.27)
Glucose: 106 mg/dL — ABNORMAL HIGH (ref 70–99)
Potassium: 4 mmol/L (ref 3.5–5.2)
Sodium: 138 mmol/L (ref 134–144)
eGFR: 95 mL/min/{1.73_m2} (ref >59)

## 2021-08-24 NOTE — Progress Notes (Signed)
Internal Medicine Clinic Attending ° °I saw and evaluated the patient.  I personally confirmed the key portions of the history and exam documented by Dr. Atway and I reviewed pertinent patient test results.  The assessment, diagnosis, and plan were formulated together and I agree with the documentation in the resident’s note.  °

## 2021-08-30 ENCOUNTER — Other Ambulatory Visit: Payer: Self-pay | Admitting: Student

## 2021-08-30 DIAGNOSIS — E119 Type 2 diabetes mellitus without complications: Secondary | ICD-10-CM

## 2021-08-30 DIAGNOSIS — I10 Essential (primary) hypertension: Secondary | ICD-10-CM

## 2021-11-19 ENCOUNTER — Other Ambulatory Visit: Payer: Self-pay | Admitting: Internal Medicine

## 2021-11-19 DIAGNOSIS — Z8673 Personal history of transient ischemic attack (TIA), and cerebral infarction without residual deficits: Secondary | ICD-10-CM

## 2021-12-11 ENCOUNTER — Other Ambulatory Visit: Payer: Self-pay | Admitting: Student

## 2021-12-11 DIAGNOSIS — Z8673 Personal history of transient ischemic attack (TIA), and cerebral infarction without residual deficits: Secondary | ICD-10-CM

## 2021-12-29 ENCOUNTER — Other Ambulatory Visit: Payer: Self-pay | Admitting: Internal Medicine

## 2021-12-29 DIAGNOSIS — E119 Type 2 diabetes mellitus without complications: Secondary | ICD-10-CM

## 2021-12-29 NOTE — Telephone Encounter (Signed)
Next appt scheduled 12/30/21 with PCP.

## 2021-12-30 ENCOUNTER — Encounter: Payer: Self-pay | Admitting: Student

## 2021-12-30 ENCOUNTER — Ambulatory Visit (INDEPENDENT_AMBULATORY_CARE_PROVIDER_SITE_OTHER): Payer: Medicare HMO | Admitting: Student

## 2021-12-30 VITALS — BP 136/84 | HR 64 | Temp 98.1°F | Ht 73.0 in | Wt 177.7 lb

## 2021-12-30 DIAGNOSIS — Z87891 Personal history of nicotine dependence: Secondary | ICD-10-CM

## 2021-12-30 DIAGNOSIS — Z7984 Long term (current) use of oral hypoglycemic drugs: Secondary | ICD-10-CM

## 2021-12-30 DIAGNOSIS — E119 Type 2 diabetes mellitus without complications: Secondary | ICD-10-CM | POA: Diagnosis not present

## 2021-12-30 DIAGNOSIS — I10 Essential (primary) hypertension: Secondary | ICD-10-CM

## 2021-12-30 DIAGNOSIS — Z8673 Personal history of transient ischemic attack (TIA), and cerebral infarction without residual deficits: Secondary | ICD-10-CM | POA: Diagnosis not present

## 2021-12-30 DIAGNOSIS — F17200 Nicotine dependence, unspecified, uncomplicated: Secondary | ICD-10-CM

## 2021-12-30 LAB — POCT GLYCOSYLATED HEMOGLOBIN (HGB A1C): Hemoglobin A1C: 8.8 % — AB (ref 4.0–5.6)

## 2021-12-30 LAB — GLUCOSE, CAPILLARY: Glucose-Capillary: 176 mg/dL — ABNORMAL HIGH (ref 70–99)

## 2021-12-30 MED ORDER — METFORMIN HCL ER 500 MG PO TB24: 500.0000 mg | ORAL_TABLET | Freq: Every day | ORAL | 2 refills | Status: DC

## 2021-12-30 MED ORDER — EZETIMIBE 10 MG PO TABS: 10.0000 mg | ORAL_TABLET | Freq: Every day | ORAL | 11 refills | Status: AC

## 2021-12-30 NOTE — Assessment & Plan Note (Signed)
Initial blood pressure was elevated 147/89, recheck 136/84.  Patient report adherence to his medication.  -Continue amlodipine 10 mg, chlorthalidone 12.5 mg and lisinopril 40 mg daily -His BMP checked in July was unremarkable

## 2021-12-30 NOTE — Progress Notes (Signed)
   CC: Follow-up on blood pressure and diabetes  HPI:  [name] is a 66 y.o. with past medical history of hypertension, CVA, hyperlipidemia, type 2 diabetes who presents to clinic for routine follow-up and A1c check.  Please see problem based charting for detail  Past Medical History:  Diagnosis Date   CVA (cerebral vascular accident) ([place]) 12/2015   Diabetes ([place]) 2017   Hyperlipidemia    Hypertension    Review of Systems:  per HPI  Physical Exam:  Vitals:   12/30/21 1451 12/30/21 1552  BP: (!) 147/89 136/84  Pulse: 68 64  Temp: 98.1 F (36.7 C)   TempSrc: Oral   SpO2: 100%   Weight: 177 lb 11.2 oz (80.6 kg)   Height: '6\' 1"'$  (1.854 m)    Physical Exam Constitutional:      General: He is not in acute distress.    Appearance: He is not ill-appearing.  HENT:     Head: Normocephalic.  Eyes:     General:        Right eye: No discharge.        Left eye: No discharge.     Conjunctiva/sclera: Conjunctivae normal.  Cardiovascular:     Rate and Rhythm: Normal rate and regular rhythm.  Pulmonary:     Effort: Pulmonary effort is normal. No respiratory distress.     Breath sounds: Normal breath sounds. No wheezing.  Musculoskeletal:        General: Normal range of motion.     Cervical back: Normal range of motion.     Comments: +2 pedis pulses palpated bilaterally.  Patient has thickened toenails with multiple calluses on the right foot.  No obvious wound or ulcer noted.  Skin:    General: Skin is warm.  Neurological:     Mental Status: He is alert.  Psychiatric:        Mood and Affect: Mood normal.      Assessment & Plan:   See Encounters Tab for problem based charting.  HTN (hypertension) Initial blood pressure was elevated 147/89, recheck 136/84.  Patient report adherence to his medication.  -Continue amlodipine 10 mg, chlorthalidone 12.5 mg and lisinopril 40 mg daily -His BMP checked in July was unremarkable  Diabetes ([place]) Repeat A1c of 8.8,  up from 8.6 3 months ago.  Currently taking Januvia 50 mg.  Patient states that he has been eating a high amount of sugary and starchy food during Thanksgiving.  He states that he will try to watch what he eats but it would be difficult since Christmas is coming.  -Patient was on metformin in the past but could not tolerate side effects.  He is willing to try low-dose metformin 500 mg extended release form.  -Continue Januvia 50 mg -Ideally, he would benefit from an SGLT2 or GLP-1.  However SGLT2 was discontinued due to frequent yeast infection.  GLP-1 may be an option but will try Rybelsus which has less weight loss side effects (his BMI is 23) -Referral to podiatry for foot callus -A1C in 3 months  History of CVA (cerebrovascular accident) Last LDL 130.  He is currently taking Lipitor, aspirin and Zetia  -Check lipid panel.  Goal LDL less than 70  Tobacco use disorder Patient has cut back on his smoking.  He only smokes 3 cigarettes a day.  -Order CT low-dose screening test for his 45-pack-year smoking   Patient discussed with Dr. Dareen Piano

## 2021-12-30 NOTE — Assessment & Plan Note (Addendum)
Repeat A1c of 8.8, up from 8.6 3 months ago.  Currently taking Januvia 50 mg.  Patient states that he has been eating a high amount of sugary and starchy food during Thanksgiving.  He states that he will try to watch what he eats but it would be difficult since Christmas is coming.  -Patient was on metformin in the past but could not tolerate side effects.  He is willing to try low-dose metformin 500 mg extended release form.  -Continue Januvia 50 mg -Ideally, he would benefit from an SGLT2 or GLP-1.  However SGLT2 was discontinued due to frequent yeast infection.  GLP-1 may be an option but will try Rybelsus which has less weight loss side effects (his BMI is 23) -Referral to podiatry for foot callus -A1C in 3 months

## 2021-12-30 NOTE — Patient Instructions (Addendum)
[name],  It was nice seeing you in the clinic today.  Here is a summary what we talked about:  1.  Your blood pressure is well-controlled.  Please continue amlodipine 10 mg, chlorthalidone 12.5 mg and lisinopril 40 mg daily.  2.  Your A1c is 8.8 which means your diabetes is not controlled.  Please continue Januvia and I added metformin 500 mg.  I also placed a referral to podiatrist for your foot callus.  3.  I also check your cholesterol panel today  4.  Please try to cut back on smoking is much as possible.  I placed an order for CT chest for lung cancer screening.  Please return in 3 months for A1c recheck  Dr. Alfonse Spruce

## 2021-12-30 NOTE — Assessment & Plan Note (Signed)
Patient has cut back on his smoking.  He only smokes 3 cigarettes a day.  -Order CT low-dose screening test for his 45-pack-year smoking

## 2021-12-30 NOTE — Assessment & Plan Note (Addendum)
Last LDL 130.  He is currently taking Lipitor, aspirin and Zetia  -Check lipid panel.  Goal LDL less than 70  Addendum His lipid panel has improved, LDL trended down to 89.  With his CVA, the goal is to get LDL less than 70.  Patient reports adherence to his Lipitor and Zetia but reported a gap time about 3 months ago that he did not have his medications.  I presented 2 options.  First option is to continue taking his medication regularly and watch his diet.  Patient has been eating more during the holiday.  We will recheck his lipid panel at next visit in 3 months.  The second option is to consider PCSK9.  Patient would like to go with option 1.  I think it is reasonable with his LDL is almost at goal.

## 2021-12-31 LAB — LIPID PANEL
Chol/HDL Ratio: 3 ratio (ref 0.0–5.0)
Cholesterol, Total: 157 mg/dL (ref 100–199)
HDL: 52 mg/dL (ref >39)
LDL Chol Calc (NIH): 89 mg/dL (ref 0–99)
Triglycerides: 87 mg/dL (ref 0–149)
VLDL Cholesterol Cal: 16 mg/dL (ref 5–40)

## 2021-12-31 NOTE — Progress Notes (Addendum)
Internal Medicine Clinic Attending  Case discussed with Dr. Nguyen  At the time of the visit.  We reviewed the resident's history and exam and pertinent patient test results.  I agree with the assessment, diagnosis, and plan of care documented in the resident's note. 

## 2022-01-02 ENCOUNTER — Other Ambulatory Visit: Payer: Self-pay | Admitting: Internal Medicine

## 2022-01-02 DIAGNOSIS — I10 Essential (primary) hypertension: Secondary | ICD-10-CM

## 2022-02-09 ENCOUNTER — Ambulatory Visit (HOSPITAL_COMMUNITY): Payer: Medicare HMO

## 2022-02-16 ENCOUNTER — Encounter: Payer: Self-pay | Admitting: Student

## 2022-02-16 DIAGNOSIS — E785 Hyperlipidemia, unspecified: Secondary | ICD-10-CM | POA: Insufficient documentation

## 2022-02-23 ENCOUNTER — Inpatient Hospital Stay (HOSPITAL_COMMUNITY)
Admission: EM | Admit: 2022-02-23 | Discharge: 2022-02-25 | DRG: 638 | Disposition: A | Discharge status: Home / Self Care | Payer: Medicare HMO | Source: Ambulatory Visit | Attending: Student in an Organized Health Care Education/Training Program | Admitting: Student in an Organized Health Care Education/Training Program

## 2022-02-23 ENCOUNTER — Encounter: Payer: Self-pay | Admitting: Internal Medicine

## 2022-02-23 ENCOUNTER — Ambulatory Visit (INDEPENDENT_AMBULATORY_CARE_PROVIDER_SITE_OTHER): Payer: Medicare HMO

## 2022-02-23 ENCOUNTER — Other Ambulatory Visit: Payer: Self-pay

## 2022-02-23 ENCOUNTER — Emergency Department (HOSPITAL_COMMUNITY): Payer: Medicare HMO

## 2022-02-23 VITALS — BP 65/48 | HR 101 | Temp 97.7°F | Ht 73.0 in | Wt 157.5 lb

## 2022-02-23 DIAGNOSIS — E860 Dehydration: Secondary | ICD-10-CM | POA: Diagnosis present

## 2022-02-23 DIAGNOSIS — N179 Acute kidney failure, unspecified: Secondary | ICD-10-CM | POA: Diagnosis present

## 2022-02-23 DIAGNOSIS — Z6820 Body mass index (BMI) 20.0-20.9, adult: Secondary | ICD-10-CM

## 2022-02-23 DIAGNOSIS — E119 Type 2 diabetes mellitus without complications: Secondary | ICD-10-CM

## 2022-02-23 DIAGNOSIS — Z7982 Long term (current) use of aspirin: Secondary | ICD-10-CM

## 2022-02-23 DIAGNOSIS — Z7984 Long term (current) use of oral hypoglycemic drugs: Secondary | ICD-10-CM

## 2022-02-23 DIAGNOSIS — Z8673 Personal history of transient ischemic attack (TIA), and cerebral infarction without residual deficits: Secondary | ICD-10-CM

## 2022-02-23 DIAGNOSIS — D72829 Elevated white blood cell count, unspecified: Secondary | ICD-10-CM | POA: Diagnosis present

## 2022-02-23 DIAGNOSIS — R634 Abnormal weight loss: Secondary | ICD-10-CM | POA: Diagnosis not present

## 2022-02-23 DIAGNOSIS — E1165 Type 2 diabetes mellitus with hyperglycemia: Principal | ICD-10-CM | POA: Diagnosis present

## 2022-02-23 DIAGNOSIS — I959 Hypotension, unspecified: Secondary | ICD-10-CM | POA: Diagnosis not present

## 2022-02-23 DIAGNOSIS — R9431 Abnormal electrocardiogram [ECG] [EKG]: Secondary | ICD-10-CM | POA: Diagnosis present

## 2022-02-23 DIAGNOSIS — E785 Hyperlipidemia, unspecified: Secondary | ICD-10-CM | POA: Diagnosis present

## 2022-02-23 DIAGNOSIS — Z634 Disappearance and death of family member: Secondary | ICD-10-CM

## 2022-02-23 DIAGNOSIS — E440 Moderate protein-calorie malnutrition: Secondary | ICD-10-CM | POA: Diagnosis present

## 2022-02-23 DIAGNOSIS — Z833 Family history of diabetes mellitus: Secondary | ICD-10-CM

## 2022-02-23 DIAGNOSIS — F17210 Nicotine dependence, cigarettes, uncomplicated: Secondary | ICD-10-CM | POA: Diagnosis present

## 2022-02-23 DIAGNOSIS — I10 Essential (primary) hypertension: Secondary | ICD-10-CM

## 2022-02-23 DIAGNOSIS — Z8249 Family history of ischemic heart disease and other diseases of the circulatory system: Secondary | ICD-10-CM

## 2022-02-23 DIAGNOSIS — Z556 Problems related to health literacy: Secondary | ICD-10-CM

## 2022-02-23 DIAGNOSIS — Z79899 Other long term (current) drug therapy: Secondary | ICD-10-CM

## 2022-02-23 DIAGNOSIS — R739 Hyperglycemia, unspecified: Secondary | ICD-10-CM

## 2022-02-23 DIAGNOSIS — Z823 Family history of stroke: Secondary | ICD-10-CM

## 2022-02-23 HISTORY — DX: Hypotension, unspecified: I95.9

## 2022-02-23 LAB — COMPREHENSIVE METABOLIC PANEL
ALT: 26 U/L (ref 0–44)
AST: 28 U/L (ref 15–41)
Albumin: 3.8 g/dL (ref 3.5–5.0)
Alkaline Phosphatase: 90 U/L (ref 38–126)
Anion gap: 15 (ref 5–15)
BUN: 36 mg/dL — ABNORMAL HIGH (ref 8–23)
CO2: 28 mmol/L (ref 22–32)
Calcium: 9 mg/dL (ref 8.9–10.3)
Chloride: 85 mmol/L — ABNORMAL LOW (ref 98–111)
Creatinine, Ser: 2.08 mg/dL — ABNORMAL HIGH (ref 0.61–1.24)
GFR, Estimated: 34 mL/min — ABNORMAL LOW (ref >60)
Glucose, Bld: 539 mg/dL (ref 70–99)
Potassium: 4.1 mmol/L (ref 3.5–5.1)
Sodium: 128 mmol/L — ABNORMAL LOW (ref 135–145)
Total Bilirubin: 1 mg/dL (ref 0.3–1.2)
Total Protein: 7 g/dL (ref 6.5–8.1)

## 2022-02-23 LAB — CBC WITH DIFFERENTIAL/PLATELET
Abs Immature Granulocytes: 0.05 10*3/uL (ref 0.00–0.07)
Basophils Absolute: 0.1 10*3/uL (ref 0.0–0.1)
Basophils Relative: 1 %
Eosinophils Absolute: 0.1 10*3/uL (ref 0.0–0.5)
Eosinophils Relative: 1 %
HCT: 42.8 % (ref 39.0–52.0)
Hemoglobin: 15.2 g/dL (ref 13.0–17.0)
Immature Granulocytes: 0 %
Lymphocytes Relative: 15 %
Lymphs Abs: 2 10*3/uL (ref 0.7–4.0)
MCH: 30 pg (ref 26.0–34.0)
MCHC: 35.5 g/dL (ref 30.0–36.0)
MCV: 84.4 fL (ref 80.0–100.0)
Monocytes Absolute: 0.8 10*3/uL (ref 0.1–1.0)
Monocytes Relative: 6 %
Neutro Abs: 10.3 10*3/uL — ABNORMAL HIGH (ref 1.7–7.7)
Neutrophils Relative %: 77 %
Platelets: 215 10*3/uL (ref 150–400)
RBC: 5.07 MIL/uL (ref 4.22–5.81)
RDW: 12.4 % (ref 11.5–15.5)
WBC: 13.3 10*3/uL — ABNORMAL HIGH (ref 4.0–10.5)
nRBC: 0 % (ref 0.0–0.2)

## 2022-02-23 LAB — URINALYSIS, ROUTINE W REFLEX MICROSCOPIC
Bilirubin Urine: NEGATIVE
Glucose, UA: >=500 mg/dL — AB
Hgb urine dipstick: NEGATIVE
Ketones, ur: NEGATIVE mg/dL
Leukocytes,Ua: NEGATIVE
Nitrite: NEGATIVE
Protein, ur: NEGATIVE mg/dL
Specific Gravity, Urine: 1.014 (ref 1.005–1.030)
pH: 5 (ref 5.0–8.0)

## 2022-02-23 LAB — I-STAT VENOUS BLOOD GAS, ED
Acid-Base Excess: 3 mmol/L — ABNORMAL HIGH (ref 0.0–2.0)
Bicarbonate: 31.3 mmol/L — ABNORMAL HIGH (ref 20.0–28.0)
Calcium, Ion: 1.11 mmol/L — ABNORMAL LOW (ref 1.15–1.40)
HCT: 46 % (ref 39.0–52.0)
Hemoglobin: 15.6 g/dL (ref 13.0–17.0)
O2 Saturation: 70 %
Potassium: 3.9 mmol/L (ref 3.5–5.1)
Sodium: 127 mmol/L — ABNORMAL LOW (ref 135–145)
TCO2: 33 mmol/L — ABNORMAL HIGH (ref 22–32)
pCO2, Ven: 60.2 mmHg — ABNORMAL HIGH (ref 44–60)
pH, Ven: 7.324 (ref 7.25–7.43)
pO2, Ven: 40 mmHg (ref 32–45)

## 2022-02-23 LAB — GLUCOSE, CAPILLARY: Glucose-Capillary: >600 mg/dL (ref 70–99)

## 2022-02-23 LAB — I-STAT CHEM 8, ED
BUN: 36 mg/dL — ABNORMAL HIGH (ref 8–23)
Calcium, Ion: 1.1 mmol/L — ABNORMAL LOW (ref 1.15–1.40)
Chloride: 88 mmol/L — ABNORMAL LOW (ref 98–111)
Creatinine, Ser: 2 mg/dL — ABNORMAL HIGH (ref 0.61–1.24)
Glucose, Bld: 536 mg/dL (ref 70–99)
HCT: 47 % (ref 39.0–52.0)
Hemoglobin: 16 g/dL (ref 13.0–17.0)
Potassium: 4 mmol/L (ref 3.5–5.1)
Sodium: 128 mmol/L — ABNORMAL LOW (ref 135–145)
TCO2: 29 mmol/L (ref 22–32)

## 2022-02-23 LAB — CBG MONITORING, ED
Glucose-Capillary: 547 mg/dL (ref 70–99)
Glucose-Capillary: 465 mg/dL — ABNORMAL HIGH (ref 70–99)
Glucose-Capillary: 349 mg/dL — ABNORMAL HIGH (ref 70–99)
Glucose-Capillary: 225 mg/dL — ABNORMAL HIGH (ref 70–99)

## 2022-02-23 LAB — HEMOGLOBIN A1C
Hgb A1c MFr Bld: 14.3 % — ABNORMAL HIGH (ref 4.8–5.6)
Mean Plasma Glucose: 363.71 mg/dL

## 2022-02-23 MED ORDER — RIVAROXABAN 10 MG PO TABS
10.0000 mg | ORAL_TABLET | Freq: Every day | ORAL | Status: DC
  Administered 2022-02-23 – 2022-02-25 (×3): 10 mg via ORAL
  Filled 2022-02-23 (×3): qty 1

## 2022-02-23 MED ORDER — INSULIN REGULAR(HUMAN) IN NACL 100-0.9 UT/100ML-% IV SOLN
INTRAVENOUS | Status: DC
  Administered 2022-02-23 (×2): 10.5 [IU]/h via INTRAVENOUS
  Filled 2022-02-23: qty 100

## 2022-02-23 MED ORDER — POTASSIUM CHLORIDE 2 MEQ/ML IV SOLN
INTRAVENOUS | Status: DC
  Filled 2022-02-23: qty 1000

## 2022-02-23 MED ORDER — INSULIN GLARGINE-YFGN 100 UNIT/ML ~~LOC~~ SOLN
10.0000 [IU] | Freq: Once | SUBCUTANEOUS | Status: AC
  Administered 2022-02-24: 10 [IU] via SUBCUTANEOUS
  Filled 2022-02-23: qty 0.1

## 2022-02-23 MED ORDER — INSULIN ASPART 100 UNIT/ML IJ SOLN
0.0000 [IU] | Freq: Three times a day (TID) | INTRAMUSCULAR | Status: DC
  Administered 2022-02-24: 5 [IU] via SUBCUTANEOUS
  Administered 2022-02-24 – 2022-02-25 (×2): 8 [IU] via SUBCUTANEOUS
  Administered 2022-02-25: 2 [IU] via SUBCUTANEOUS

## 2022-02-23 MED ORDER — LACTATED RINGERS IV SOLN: INTRAVENOUS | Status: DC

## 2022-02-23 MED ORDER — LACTATED RINGERS IV BOLUS
30.0000 mL/kg | Freq: Once | INTRAVENOUS | Status: AC
  Administered 2022-02-23: 2136 mL via INTRAVENOUS

## 2022-02-23 MED ORDER — DEXTROSE IN LACTATED RINGERS 5 % IV SOLN: INTRAVENOUS | Status: DC

## 2022-02-23 MED ORDER — DEXTROSE 50 % IV SOLN: 0.0000 mL | INTRAVENOUS | Status: DC | PRN

## 2022-02-23 MED ORDER — INSULIN ASPART 100 UNIT/ML IJ SOLN
10.0000 [IU] | Freq: Once | INTRAMUSCULAR | Status: AC
  Administered 2022-02-23: 10 [IU] via INTRAVENOUS

## 2022-02-23 MED ORDER — LACTATED RINGERS IV SOLN: INTRAVENOUS | Status: AC

## 2022-02-23 NOTE — Progress Notes (Signed)
Internal Medicine Clinic Attending  I saw and evaluated the patient.  I personally confirmed the key portions of the history and exam documented by Dr. Alton Revere and I reviewed pertinent patient test results.  The assessment, diagnosis, and plan were formulated together and I agree with the documentation in the resident's note.  Slender man with recent weight gain presenting with hyperglycemia (unregistered on FSG) and hypotension, volume depleted on exam.  BP began responding to oral fluids in [facility] which is encouraging.  At this point the combination of added diuresis and antihypertensive effect of increased chlorthalidone, together with increased volume loss from glucosuria from uncontrolled DM, in setting of poor appetite are suspected contributors to his presentation.  He is asymptomatic and afebrile, thus no concern for sepsis at this time.  I agree with ED evaluation for IVF, labs.  He will need insulin for acute glucose control and reconsideration of baseline/home regimen thereafter, upon clarifying the etiology of his DM.  Weight loss investigation will be important.  Unrelated, LDL goal is < 70 given hx of CVA.

## 2022-02-23 NOTE — Assessment & Plan Note (Signed)
Patient has had 20 lb weight loss over the last 2 months. Patient reports that their appetite has been decreased appetite over the last 2 weeks. He denies nausea or vomiting. He denies symptoms of depression. Denies history of cancer. Patient has 45 pack year smoking. Patient was referred for yearly lung cancer screening CT at last appointment but was unable to make this appointment. Colonoscopy from 05/2021 was normal without any polyps. The cause of his decreased appetite is uncertain at this time but concerning. I believe the patient needs to have his yearly lung cancer screening CT done in the near future. Will message our coordinator to ensure this is rescheduled.   Plan: - Yearly lung cancer screening CT

## 2022-02-23 NOTE — ED Provider Notes (Signed)
[facility] Provider Note  CSN: [id number] Arrival date & time: 02/23/22 1657  Chief Complaint(s) Hypotension  HPI TYHIR SCHWAN is a 67 y.o. male with history of prior stroke, diabetes, hypertension, hyperlipidemia presenting to the emergency department with low blood pressure.  Patient reports that for the past day he has had mild lightheadedness.  Denies sensation of weakness.  No fevers or chills, headaches, chest pain, abdominal pain, nausea, vomiting, shortness of breath, diarrhea, dysuria, or any other complaints.  He went to his internal medicine clinic today where they noticed that his blood pressure was 65.  They sent him to the emergency department for further evaluation.   Past Medical History Past Medical History:  Diagnosis Date   CVA (cerebral vascular accident) ([place]) 12/2015   Diabetes ([place]) 2017   Hyperlipidemia    Hypertension    Patient Active Problem List   Diagnosis Date Noted   Hypotension 02/23/2022   Weight loss 02/23/2022   Hyperglycemia due to diabetes mellitus ([place]) 02/23/2022   Hyperlipidemia 02/16/2022   Phimosis 01/02/2020   Erectile dysfunction 07/08/2017   Inguinal hernia of right side without obstruction or gangrene 09/03/2016   Diabetes ([place]) 05/21/2016   Tobacco use disorder 02/10/2016   Health care maintenance 02/10/2016   History of CVA (cerebrovascular accident) 01/07/2016   HTN (hypertension) 01/07/2016   Home Medication(s) Prior to Admission medications   Medication Sig Start Date End Date Taking? Authorizing Provider  acetaminophen (TYLENOL) 325 MG tablet Take 650 mg by mouth every 6 (six) hours as needed.    [provider]  amLODipine (NORVASC) 10 MG tablet TAKE 1 TABLET BY MOUTH EVERY DAY 01/05/22   Gaylan Gerold, DO  ASPIRIN LOW DOSE 81 MG tablet TAKE 1 TABLET (81 MG TOTAL) BY MOUTH DAILY. SWALLOW WHOLE. 09/01/21   Gaylan Gerold, DO  atorvastatin (LIPITOR) 80 MG tablet TAKE 1  TABLET BY MOUTH DAILY AT 6 PM. 11/19/21   Gaylan Gerold, DO  chlorthalidone (HYGROTON) 25 MG tablet Take 0.5 tablets (12.5 mg total) by mouth daily. 08/21/21 08/16/22  [place], Jeananne Rama, DO  ezetimibe (ZETIA) 10 MG tablet Take 1 tablet (10 mg total) by mouth daily. 12/30/21 12/30/22  Gaylan Gerold, DO  glucose blood (ACCU-CHEK GUIDE) test strip Use as instructed 07/10/18   Lorella Nimrod, MD  JANUVIA 50 MG tablet TAKE 1 TABLET BY MOUTH EVERY DAY 12/29/21   Gaylan Gerold, DO  lisinopril (ZESTRIL) 40 MG tablet TAKE 1 TABLET BY MOUTH EVERY DAY 08/11/21   Gaylan Gerold, DO  metFORMIN (GLUCOPHAGE-XR) 500 MG 24 hr tablet Take 1 tablet (500 mg total) by mouth daily with breakfast. 12/30/21 03/30/22  Gaylan Gerold, DO  tadalafil (CIALIS) 10 MG tablet Take 1 tablet (10 mg total) by mouth as needed for erectile dysfunction. 11/06/18 11/06/19  Earlene Plater, MD  Past Surgical History Past Surgical History:  Procedure Laterality Date   COLONOSCOPY  2019   CYST REMOVAL LEG     INGUINAL HERNIA REPAIR Right 09/13/2018   Procedure: RIGHT INGUINAL HERNIA REPAIR WITH MESH;  Surgeon: Donnie Mesa, MD;  Location: [facility];  Service: General;  Laterality: Right;  GENERAL AND TAP BLOCK   Family History Family History  Problem Relation Age of Onset   Diabetes Mother    Hypertension Mother    Hypertension Father    Stroke Paternal Grandmother    Colon cancer Neg Hx    Colon polyps Neg Hx    Esophageal cancer Neg Hx    Stomach cancer Neg Hx    Rectal cancer Neg Hx     Social History Social History   Tobacco Use   Smoking status: Every Day    Packs/day: 1.00    Years: 45.00    Total pack years: 45.00    Types: Cigarettes    Last attempt to quit: 01/07/2016    Years since quitting: 6.1   Smokeless tobacco: Never   Tobacco comments:    3 cigs per day  Vaping Use   Vaping Use: Never used   Substance Use Topics   Alcohol use: Yes    Comment: rarely./occ.   Drug use: Not Currently    Types: Marijuana    Comment: Not in years   Allergies Patient has no known allergies.  Review of Systems Review of Systems  All other systems reviewed and are negative.   Physical Exam Vital Signs  I have reviewed the triage vital signs BP 138/79   Pulse 76   Temp 98.1 F (36.7 C)   Resp 18   Ht '6\' 1"'$  (1.854 m)   Wt 71.2 kg   SpO2 100%   BMI 20.71 kg/m  Physical Exam Vitals and nursing note reviewed.  Constitutional:      General: He is not in acute distress.    Appearance: Normal appearance.  HENT:     Mouth/Throat:     Mouth: Mucous membranes are dry.  Eyes:     Conjunctiva/sclera: Conjunctivae normal.  Cardiovascular:     Rate and Rhythm: Normal rate and regular rhythm.  Pulmonary:     Effort: Pulmonary effort is normal. No respiratory distress.     Breath sounds: Normal breath sounds.  Abdominal:     General: Abdomen is flat.     Palpations: Abdomen is soft.     Tenderness: There is no abdominal tenderness.  Musculoskeletal:     Right lower leg: No edema.     Left lower leg: No edema.  Skin:    General: Skin is warm and dry.     Capillary Refill: Capillary refill takes less than 2 seconds.  Neurological:     Mental Status: He is alert and oriented to person, place, and time. Mental status is at baseline.  Psychiatric:        Mood and Affect: Mood normal.        Behavior: Behavior normal.     ED Results and Treatments Labs (all labs ordered are listed, but only abnormal results are displayed) Labs Reviewed  COMPREHENSIVE METABOLIC PANEL - Abnormal; Notable for the following components:      Result Value   Sodium 128 (*)    Chloride 85 (*)    Glucose, Bld 539 (*)    BUN 36 (*)    Creatinine, Ser 2.08 (*)    GFR, Estimated 34 (*)  All other components within normal limits  CBC WITH DIFFERENTIAL/PLATELET - Abnormal; Notable for the following  components:   WBC 13.3 (*)    Neutro Abs 10.3 (*)    All other components within normal limits  URINALYSIS, ROUTINE W REFLEX MICROSCOPIC - Abnormal; Notable for the following components:   Glucose, UA >=500 (*)    Bacteria, UA RARE (*)    All other components within normal limits  CBG MONITORING, ED - Abnormal; Notable for the following components:   Glucose-Capillary 547 (*)    All other components within normal limits  I-STAT VENOUS BLOOD GAS, ED - Abnormal; Notable for the following components:   pCO2, Ven 60.2 (*)    Bicarbonate 31.3 (*)    TCO2 33 (*)    Acid-Base Excess 3.0 (*)    Sodium 127 (*)    Calcium, Ion 1.11 (*)    All other components within normal limits  I-STAT CHEM 8, ED - Abnormal; Notable for the following components:   Sodium 128 (*)    Chloride 88 (*)    BUN 36 (*)    Creatinine, Ser 2.00 (*)    Glucose, Bld 536 (*)    Calcium, Ion 1.10 (*)    All other components within normal limits  CBG MONITORING, ED - Abnormal; Notable for the following components:   Glucose-Capillary 465 (*)    All other components within normal limits  HIV ANTIBODY (ROUTINE TESTING W REFLEX)  BASIC METABOLIC PANEL  HEMOGLOBIN A1C  OSMOLALITY                                                                                                                          Radiology DG Chest Port 1 View  Result Date: 02/23/2022 CLINICAL DATA:  Weakness. EXAM: PORTABLE CHEST 1 VIEW COMPARISON:  None Available. FINDINGS: 1823 hours. Lordotic positioning. The heart size and mediastinal contours are normal. The lungs are clear. There is no pleural effusion or pneumothorax. No acute osseous findings are identified. Telemetry leads overlie the chest. IMPRESSION: No evidence of active cardiopulmonary process. Electronically Signed   By: Richardean Sale M.D.   On: 02/23/2022 18:34    Pertinent labs & imaging results that were available during my care of the patient were reviewed by me and considered  in my medical decision making (see MDM for details).  Medications Ordered in ED Medications  rivaroxaban (XARELTO) tablet 10 mg (has no administration in time range)  insulin regular, human (MYXREDLIN) 100 units/ 100 mL infusion (has no administration in time range)  dextrose 5 % in lactated ringers infusion (has no administration in time range)  dextrose 50 % solution 0-50 mL (has no administration in time range)  lactated ringers 1,000 mL with potassium chloride 20 mEq infusion (has no administration in time range)  lactated ringers bolus 2,136 mL (0 mLs Intravenous Stopped 02/23/22 2005)  insulin aspart (novoLOG) injection 10 Units (10 Units Intravenous Given 02/23/22 2026)  Procedures .Critical Care  Performed by: Cristie Hem, MD Authorized by: Cristie Hem, MD   Critical care provider statement:    Critical care time (minutes):  30   Critical care was necessary to treat or prevent imminent or life-threatening deterioration of the following conditions:  Dehydration   Critical care was time spent personally by me on the following activities:  Development of treatment plan with patient or surrogate, discussions with consultants, evaluation of patient's response to treatment, examination of patient, ordering and review of laboratory studies, ordering and review of radiographic studies, ordering and performing treatments and interventions, pulse oximetry, re-evaluation of patient's condition and review of old charts   (including critical care time)  Medical Decision Making / ED Course   MDM:  67 year old male presenting to the emergency department with low blood pressure.  Patient overall very well-appearing, blood pressure improved in the emergency department without intervention.  His physical exam notable only for dry mucous  membranes.  Point-of-care glucose is 547, suspect his low blood pressure earlier and relatively mild hypotension emergency department due to significant dehydration.  No physical exam findings or symptoms concerning for DKA or HHS.  Mentation is normal.  Low concern for underlying occult infectious process, no symptoms to suggest skin or soft tissue infection, pneumonia, urinary infection, intra-abdominal infection but will check urinalysis and chest x-ray.  Given significant dehydration we will check electrolytes to evaluate for AKI.  Will reassess.  Will fluid resuscitate.  Clinical Course as of 02/23/22 2055  Tue Feb 23, 2022  2055 Labs notable for acute kidney injury likely due to dehydration. Will give 10 un insulin IV as still very hyperglycemic after 2L IVF. Admitted to IMTS [WS]    Clinical Course User Index [WS] Cristie Hem, MD     Additional history obtained:  -External records from outside source obtained and reviewed including: Chart review including previous notes, labs, imaging, consultation notes including internal medicine note today   Lab Tests: -I ordered, reviewed, and interpreted labs.   The pertinent results include:   Labs Reviewed  COMPREHENSIVE METABOLIC PANEL - Abnormal; Notable for the following components:      Result Value   Sodium 128 (*)    Chloride 85 (*)    Glucose, Bld 539 (*)    BUN 36 (*)    Creatinine, Ser 2.08 (*)    GFR, Estimated 34 (*)    All other components within normal limits  CBC WITH DIFFERENTIAL/PLATELET - Abnormal; Notable for the following components:   WBC 13.3 (*)    Neutro Abs 10.3 (*)    All other components within normal limits  URINALYSIS, ROUTINE W REFLEX MICROSCOPIC - Abnormal; Notable for the following components:   Glucose, UA >=500 (*)    Bacteria, UA RARE (*)    All other components within normal limits  CBG MONITORING, ED - Abnormal; Notable for the following components:   Glucose-Capillary 547 (*)    All  other components within normal limits  I-STAT VENOUS BLOOD GAS, ED - Abnormal; Notable for the following components:   pCO2, Ven 60.2 (*)    Bicarbonate 31.3 (*)    TCO2 33 (*)    Acid-Base Excess 3.0 (*)    Sodium 127 (*)    Calcium, Ion 1.11 (*)    All other components within normal limits  I-STAT CHEM 8, ED - Abnormal; Notable for the following components:   Sodium 128 (*)    Chloride 88 (*)  BUN 36 (*)    Creatinine, Ser 2.00 (*)    Glucose, Bld 536 (*)    Calcium, Ion 1.10 (*)    All other components within normal limits  CBG MONITORING, ED - Abnormal; Notable for the following components:   Glucose-Capillary 465 (*)    All other components within normal limits  HIV ANTIBODY (ROUTINE TESTING W REFLEX)  BASIC METABOLIC PANEL  HEMOGLOBIN A1C  OSMOLALITY    Notable for pseudohyponatremia, AKI, leukocytosis likely due to dehydration/hyperglycemia    Imaging Studies ordered: I ordered imaging studies including CXR On my interpretation imaging demonstrates no acute process I independently visualized and interpreted imaging. I agree with the radiologist interpretation   Medicines ordered and prescription drug management: Meds ordered this encounter  Medications   lactated ringers bolus 2,136 mL   insulin aspart (novoLOG) injection 10 Units   rivaroxaban (XARELTO) tablet 10 mg   insulin regular, human (MYXREDLIN) 100 units/ 100 mL infusion    Order Specific Question:   EndoTool low target:    Answer:   140    Order Specific Question:   EndoTool high target:    Answer:   180    Order Specific Question:   Type of Diabetes    Answer:   Type 2    Order Specific Question:   Mode of Therapy    Answer:   Hyperglycemia    Order Specific Question:   Start Method    Answer:   EndoTool to calculate   DISCONTD: lactated ringers infusion   dextrose 5 % in lactated ringers infusion   dextrose 50 % solution 0-50 mL   lactated ringers 1,000 mL with potassium chloride 20 mEq  infusion    -I have reviewed the patients home medicines and have made adjustments as needed   Consultations Obtained: I requested consultation with the IMTS,  and discussed lab and imaging findings as well as pertinent plan - they recommend: admission   Cardiac Monitoring: The patient was maintained on a cardiac monitor.  I personally viewed and interpreted the cardiac monitored which showed an underlying rhythm of: NSR  Social Determinants of Health:  Diagnosis or treatment significantly limited by social determinants of health: current smoker   Reevaluation: After the interventions noted above, I reevaluated the patient and found that they have improved  Co morbidities that complicate the patient evaluation  Past Medical History:  Diagnosis Date   CVA (cerebral vascular accident) ([place]) 12/2015   Diabetes ([place]) 2017   Hyperlipidemia    Hypertension       Dispostion: Disposition decision including need for hospitalization was considered, and patient admitted to the hospital.    Final Clinical Impression(s) / ED Diagnoses Final diagnoses:  Dehydration  Hyperglycemia     This chart was dictated using voice recognition software.  Despite best efforts to proofread,  errors can occur which can change the documentation meaning.    Cristie Hem, MD 02/23/22 [phone]

## 2022-02-23 NOTE — ED Notes (Signed)
Patient has no complaints of pain, N/V/D, chest pain, SOB. Patient's only complaint is dizziness yesterday, but states has not felt dizzy today.

## 2022-02-23 NOTE — ED Triage Notes (Signed)
Patient arrived to the ED from internal medicine. Internal medicine stated systolic BP was 65. Patient states he was feeling yesterday, but not today. Patient states he hasn't had an appetite so hasn't been eating a lot. Patient does not take insulin. Patient is A&O x4.

## 2022-02-23 NOTE — H&P (Incomplete)
Date: 02/24/2022               Patient Name:  Seth [name] MRN: [id number]  DOB: [birth year] Age / Sex: 67 y.o., male   PCP: Gaylan Gerold, DO         Medical Service: Internal Medicine Teaching Service         Attending Physician: Dr. Evette Doffing, Mallie Mussel, *      First Contact: Dr. Angelique Blonder, DO Pager [phone]    Second Contact: Dr. Buddy Duty, DO Pager [phone]         After Hours (After 5p/  First Contact Pager: [phone]  weekends / holidays): Second Contact Pager: [phone]   SUBJECTIVE   Chief Complaint: Hypotension and Lightheaded   History of Present Illness: This is a 67 year old male with a past medical history of T2DM, HTN, HLD, Tobacco use disorder who presents to the emergency room after being sent by Internal Medicine Clinic for further evaluation. Patient was seen in the [facility] earlier today for evaluation for lightheadedness. Patient was found to have very high CBG (>600) and was hypotensive (65/51, 65/48) and sent to the ED for further evaluation after receiving some oral fluids in the [facility]. Per patient, he reports on 1/29 he became lightheaded at work. He denies falling or passing out but he felt like he was unbalanced. At the time he did not have trouble speaking, he did not lose feeling in his arms and his legs, and he had no vision changes. He described it as a feeling that he just got up too fast and had some trouble finding his balance. He then went to see the health department at his workplace and they checked his blood sugar and it was so high that the meter could not read it. Patient had systolic blood pressure at 106 at the time and patient was sent home and instructed to drink fluids and rest. The next day patient got up and tried to go to work and still felt lightheaded, so he decided to come to the [facility].  Of note, patient has been having polydipsia and polyuria and has started drinking much more juices and sodas recently. He also reports that he has been  eating more fruit. He denies any chest pain, cold sweats, shortness of breath, or abdominal pain. He denies any changes to his bowel habits. Since December, he has been having cycles where he has not been eating or drinking well and feels that his appetite comes and goes. He denies any stressors in life. He did say that his older brother passed away this week. In a 8 week period patient has lost about 20 pounds per charting.   ED Course: Patient presented to the emergency room with initial vitals showing blood pressure 65/48, pulse 101, RR 10 satting 100% on room air. Initial labs showed Glucose 539, creatinine 2.08, BUN 36, WBC 13.3, UA showing lots of glucose, VBG showing pH 7.32. Patient was given 2L LR bolus in the ED. BP did increase with most recent 119/82. Given patient was hyperglycemic with AKI, IMTS consulted for admission overnight.   Past Medical History Past Medical History:  Diagnosis Date   CVA (cerebral vascular accident) ([place]) 12/2015   Diabetes ([place]) 2017   Hyperlipidemia    Hypertension      Meds:  Amlodipine 10 mg  Lisinopril 40 mg Ezetimibe  Chlorthalidone 12.5 mg Asa 81 Atorvastatin 80 mg Januvia 50 mg daily Restarted metformin 500 mg 12/23 Cialis- not  taking    Past Surgical History  Past Surgical History:  Procedure Laterality Date   COLONOSCOPY  2019   CYST REMOVAL LEG     INGUINAL HERNIA REPAIR Right 09/13/2018   Procedure: RIGHT INGUINAL HERNIA REPAIR WITH MESH;  Surgeon: Donnie Mesa, MD;  Location: [place];  Service: General;  Laterality: Right;  GENERAL AND TAP BLOCK    Social:  Lives With: Alone  Occupation: [employer] manager  Support: Makes his own decisions  Level of Function: Independent in ADLs and IADLs PCP: Dr. Gaylan Gerold   Substances: Smoker for about 51 years, unable to quantify   Family History:  Mother T2DM in 48s  Brother Meningitis   Allergies: Allergies as of 02/23/2022   (No Known Allergies)    Review of Systems: A  complete ROS was negative except as per HPI.   OBJECTIVE:   Physical Exam: Blood pressure 119/82, pulse 69, temperature 98.1 F (36.7 C), resp. rate 13, height '6\' 1"'$  (1.854 m), weight 71.2 kg, SpO2 100 %.  Constitutional: well-appearing resting in bed in no acute distress HENT: normocephalic atraumatic Eyes: conjunctiva non-erythematous Cardiovascular: regular rate and rhythm, no m/r/g Pulmonary/Chest: normal work of breathing on room air, lungs clear to auscultation bilaterally Abdominal: soft, non-tender, non-distended, normoactive bowel sounds  MSK: normal bulk and tone Neurological: Able to follow commands  Skin: warm and dry, normal skin turgor    Labs: CBC    Component Value Date/Time   WBC 13.3 (H) 02/23/2022 1722   RBC 5.07 02/23/2022 1722   HGB 16.0 02/23/2022 1826   HCT 47.0 02/23/2022 1826   PLT 215 02/23/2022 1722   MCV 84.4 02/23/2022 1722   MCH 30.0 02/23/2022 1722   MCHC 35.5 02/23/2022 1722   RDW 12.4 02/23/2022 1722   LYMPHSABS 2.0 02/23/2022 1722   MONOABS 0.8 02/23/2022 1722   EOSABS 0.1 02/23/2022 1722   BASOSABS 0.1 02/23/2022 1722     CMP     Component Value Date/Time   NA 128 (L) 02/23/2022 1826   NA 138 08/21/2021 1203   K 3.0 (L) 02/23/2022 2344   CL 88 (L) 02/23/2022 1826   CO2 28 02/23/2022 1722   GLUCOSE 536 (HH) 02/23/2022 1826   BUN 36 (H) 02/23/2022 1826   BUN 13 08/21/2021 1203   CREATININE 2.00 (H) 02/23/2022 1826   CALCIUM 9.0 02/23/2022 1722   PROT 7.0 02/23/2022 1722   ALBUMIN 3.8 02/23/2022 1722   AST 28 02/23/2022 1722   ALT 26 02/23/2022 1722   ALKPHOS 90 02/23/2022 1722   BILITOT 1.0 02/23/2022 1722   GFRNONAA 34 (L) 02/23/2022 1722   GFRAA 81 11/15/2019 0930    Imaging:  CXR: Negative for any acute cardiopulmonary process.   EKG: personally reviewed my interpretation is Sinus rhythmm with rate of 78. Normal axis. There is some J point elevation noted in V3 and V4. When compared to prior EKG, similar with J point  elevation in anterior leads.   ASSESSMENT & PLAN:   Assessment & Plan by Problem: Principal Problem:   Hyperglycemia due to diabetes mellitus ([place] [facility])   Jakim Drapeau Zavada is a 67 y.o. male with a past medical history of T2DM, HTN, HLD, Tobacco use disorder who presents with lightheaded. Patient found to be extremely hyperglycemic with concomitant AKI. Patient admitted for further evaluation and management of severe hyperglycemia.   #Severe Hyperglycemia  #Uncontrolled T2DM #Dehydration  Patient presents with concerns of lightheadedness. Patient has also had concerns of polyuria and polydipsia.  Patient was seen earlier in the [facility] and was severely hypotensive as well as hyperglycemic and was sent to the emergency department. Given history of diabetes current differential is HHS vs Hyperglycemia.  DKA is ruled out with pH 7.3, no ketonuria, and bicarb of 28. Patient denies any nausea or vomiting. Patient does report having lots of sodas and juices recently which could precipitate this picture. When I go exam patient, he is already feeling better after getting 2L fluid bolus. On my exam, patient had no neurological deficits, and was not altered. Could have been precipitated with patient recent increase in soda intake and juice intake. Patient is also on chorthalidone which is a diuretic which could also contribute to this picture. Lightheadedness can be explained by diuretic use, and increase in urination from glucosuria. A1c was 14.3 increased from 8.8 about 2 months ago. Patient endorses taking medications and metformin was restarted in Dec 23. Unclear why A1c increased so dramatically in such a short amount of time. He was diagnosed with diabetes about 6 years ago and A1c typically between 7-8, no prior insulin use history. With concern for HHS, patient was initially started on insulin infusion, and with glucose coming down well and osmolality 302, patient was transitioned off infusion and started on long  acting insulin. -Start Semglee 10 units -Hold home metformin and Januvia  -Q4H CBGs -Carb modified diet  -Sliding scale with meals -Will need a new diabetic management plan prior to discharge -Diabetic coordinator  -Plan for orthostatics in the A.M. after 10 hours of maintenance fluids (LR)  #AKI, pre renal vs ATN #Dehydration  Patient creatinine elevated at 2.08 initially improving to 1.76 with fluids. Baseline around 0.90-1.1. This is likely prerenal in the setting of dehydration. UA showed hyaline casts. -LR infusion -Hold Lisinopril and chlorthalidone -Monitor BMP -Avoid nephrotoxic agents   #Weight loss Patient has had a 20 pound weight loss in the past 8 weeks. Patient has had cycles of decreased appetitie and then cycles of normal appetite. 20 lb loss is large in 8 weeks. Patient had a colonoscopy last year which was normal and is due for lung cancer screening soon. He denies any history of malabsorption. He denies any thyroid disorders or any family history of autoimmune disorders. Differential can include catabolic state in setting of uncontrolled diabetes with A1c of 12.3 02/23/21, hyperthyroidism, malignancy, or malabsorption. Patient is getting a CT chest in the outpatient setting for lung cancer screening. Patient reports some deceased P.O. intake but it comes in cycles so less likely.  He states that he has times where he eats only one time a day and then there are times that he has multiple meals during the day. He states that this has started since December 2023 where he would have cycles of appetite changes. Patient reports no diarrhea. Adrenal insuffiencey less likely as BP improved well with fluids. He denies any recent stressors that could cause him to be depressed. Thyroid etiology could be a source but other symptoms such as heat intolerance, increase HR, diarrhea are absent. Leading suspicion is weight loss in the setting of uncontrolled diabetes. He has lost about 12.5% of  his body weight in 8 weeks.  -Continue following weight  -Control Blood glucose -Can continue work up outpatient to rule out malignancy    #EKG changes  Patient had some concerns for some J point elevation in leads V3 and V4 on his EKG today. When compared to his previous EKG in 08/31/2018 patient had some J  point elevations as well on anterior leads. Patient denies any chest pain or any shortness of breath. No concern for ACS at this time. His ASCVD risk is 48%. Patient already on aspirin and statin. Patient could benefit from outpatient stress test or coronary calcium score.  -Monitor on telemetry  -Monitor for signs of chest pain   #Leukocytosis  Elevated WBC at 14.6 now up from 13.3. earlier. Could be elevated in the setting of possible HHS prior to initiation of fluids earlier today. Could be due to dehydration and hemoconcentration. Patient does not show signs of any infection such had fever, shortness of breath, diarrhea. On exam, no wounds appreciated and no skin changes that could point towards skin infection. CXR negative for any pneumonia.  -Monitor for signs of infection -Monitor white count   #Pseudohyponatremia  Na was 128 on CMP but corrects to 135 as glucose significantly elevated. No concerns about Na.  -Monitor BMP   #HTN Patient has a past medical history of HTN. Home medications include amlodipine 10 mg nightly, lisinopril 40 mg daily, and chorthalidone 12.5 mg daily. Patient reports compliance with home medications. Pressures were intially decreased, and recovered well with fluids.  -Hold home antihypertensives -Monitor BP  #Prior CVA  #HLD Patient has a past medical history of a CVA in the past. Most recent LDL in 12/30/2021, was 89. Patient is on aspirin 81 mg daily. Patient also take home ezetimibe and atorvastatin at home. LDL is not at goal. Per charting there has been some concern that patient is not compliant with his medication at home. -Continue home  atorvastatin 40 mg daily -Continue home ezetimibe 10 mg daily  -Continue home Asprin 81 mg daily   #Tobacco use disorder  Patient has a past medical history of TUD. Patient is still currently smoking.  -Patient can get nicotine patch if he requests   Diet: Carb-Modified VTE: DOAC IVF: LR, 100cc/hr Code: Full  Prior to Admission Living Arrangement: Home, living alone Anticipated Discharge Location: Home Barriers to Discharge: Clinical improvement   Dispo: Admit patient to Observation with expected length of stay less than 2 midnights.  Signed: Leigh Aurora, DO Internal Medicine Resident PGY-1 Pager: [phone] 02/24/2022, 12:55 AM

## 2022-02-23 NOTE — Progress Notes (Signed)
Patient escorted via w/c to ED Rm 30 with bag of belongings (coat, baseball cap, glasses.) Ambulatory Transfer Summary handed to NT with verbal handoff.

## 2022-02-23 NOTE — Assessment & Plan Note (Addendum)
Patient has a history of HTN. Current medications include lisinopril 40 mg daily, amLODipine 10 mg daily, and chlorthalidone 25 mg daily (0.5 tablet daily). Patient reports that his chlorthalidone was increased to a full tablet at his last visit however this is not documented. He has been taking a full tablet of chlorthalidone since then. Patient states that they are compliant with these medications. Patient states that they do not check their BP regularly at home. Patient denies HA, CP, or SOB. He has had lightheadedness since yesterday. Initial BP today is 65/51. Repeat BP is 65/48. Patient given water in clinic and had his BP rechecked. Systolic BP improved to the 110s however orthostatic vital signs were positive here in clinic. The cause of his hypotension is uncertain at this time but is likely secondary to polyuria from uncontrolled diabetes. Other items in the differential include sepsis, adrenal insufficiency, active bleeding, heart failure, excessive BP control. He is afebrile and but tachycardic. He is warm on exam but with dry mucous membranes and decreased skin turgor. I believe he needs further testing including CBC, BNP, and potentially morning cortisol to further characterize his hypotension.   Plan: - Patient was sent directly up to the ED room 30 given his hypotension and severe hypoglycemia - Patient will need reevaluation of his antihypertensive regimen following further workup for hypotension

## 2022-02-23 NOTE — Assessment & Plan Note (Signed)
Patient has a history of CVA in 2018 w/o residual deficits. Current medications include aspirin 81 mg daily, atorvastatin 80 mg daily, and ezetimibe 10 mg daily. Patient states that they are compliant with these medications but is unsure if he is taking ezetimibe. Denies new weakness, numbness, tingling, or difficulty speaking. Lipid panel from 1 month ago WNL w/ LDL of 89.   Plan: - Continue aspirin 81 mg daily, atorvastatin 80 mg daily, and ezetimibe 10 mg daily

## 2022-02-23 NOTE — Progress Notes (Signed)
CC: T2DM  HPI:  [name] is a 67 y.o. male with past medical history of HTN, HLD, T2DM, CVA (2018), and tobacco use disorder that presents for f/u of T2DM.   Patient reports episode of lightheadedness at work yesterday. He reports that his blood sugar was checked at that time and was elevated but did not register. He tried measuring his blood sugar at home yesterday but reports his glucometer would not give him a reading. He then scheduled an appointment to see [place] in clinic today.   Patient has also had a 20 lb weight loss over the last 2 months. He reports that their appetite has been decreased appetite over the last 2 weeks. He denies nausea or vomiting. He denies symptoms of depression. Denies history of cancer. Patient has 45 pack year smoking. He was referred for yearly lung cancer screening CT at last appointment but was unable to make this appointment. Colonoscopy from 05/2021 was normal without any polyps.   Patient has a history of T2DM. In the past, patient was unable to tolerate increase in metformin due to GI side effects. He was previously on SGLT2i which was discontinued due to frequent yeast infection. He was last seen in clinic for this condition in 12/2021. Metformin was restarted at that time. Current medications include metFORMIN 500 mg daily and sitagliptin 50 mg daily. Patient states that they are compliant with these medications. Patient does not check their blood sugar at home regularly. Patient endorses polyuria, polydipsia, fatigue over the last few weeks. Patient states that they do visit the ophthalmologist for yearly eye exams.   Patient has a history of HTN. Current medications include lisinopril 40 mg daily, amLODipine 10 mg daily, and chlorthalidone 25 mg daily (0.5 tablet daily). Patient reports that his chlorthalidone was increased to a full tablet at his last visit however this is not documented. He has been taking a full tablet of chlorthalidone since  then. Patient states that they are compliant with these medications. Patient states that they do not check their BP regularly at home. Patient denies HA, CP, or SOB.   Patient has a history of CVA in 2018 w/o residual deficits. Current medications include aspirin 81 mg daily, atorvastatin 80 mg daily, and ezetimibe 10 mg daily. Patient states that they are compliant with these medications but is unsure if he is taking ezetimibe. Denies new weakness, numbness, tingling, or difficulty speaking.   Allergies as of 02/23/2022   No Known Allergies      Medication List        Accurate as of February 23, 2022  4:41 PM. If you have any questions, ask your nurse or doctor.          Accu-Chek Guide test strip Generic drug: glucose blood Use as instructed   acetaminophen 325 MG tablet Commonly known as: TYLENOL Take 650 mg by mouth every 6 (six) hours as needed.   amLODipine 10 MG tablet Commonly known as: NORVASC TAKE 1 TABLET BY MOUTH EVERY DAY   Aspirin Low Dose 81 MG tablet Generic drug: aspirin EC TAKE 1 TABLET (81 MG TOTAL) BY MOUTH DAILY. SWALLOW WHOLE.   atorvastatin 80 MG tablet Commonly known as: LIPITOR TAKE 1 TABLET BY MOUTH DAILY AT 6 PM.   chlorthalidone 25 MG tablet Commonly known as: HYGROTON Take 0.5 tablets (12.5 mg total) by mouth daily.   ezetimibe 10 MG tablet Commonly known as: Zetia Take 1 tablet (10 mg total) by mouth daily.  Januvia 50 MG tablet Generic drug: sitaGLIPtin TAKE 1 TABLET BY MOUTH EVERY DAY   lisinopril 40 MG tablet Commonly known as: ZESTRIL TAKE 1 TABLET BY MOUTH EVERY DAY   metFORMIN 500 MG 24 hr tablet Commonly known as: GLUCOPHAGE-XR Take 1 tablet (500 mg total) by mouth daily with breakfast.   tadalafil 10 MG tablet Commonly known as: Cialis Take 1 tablet (10 mg total) by mouth as needed for erectile dysfunction.         Past Medical History:  Diagnosis Date   CVA (cerebral vascular accident) ([place]) 12/2015   Diabetes  ([place]) 2017   Hyperlipidemia    Hypertension    Review of Systems:  per HPI.   Physical Exam: Vitals:   02/23/22 1524 02/23/22 1534  BP: (!) 65/51 (!) 65/48  Pulse: (!) 101   Temp: 97.7 F (36.5 C)   TempSrc: Oral   SpO2: 98%   Weight: 157 lb 8 oz (71.4 kg)   Height: '6\' 1"'$  (1.854 m)    Constitutional: thin appearing, appears comfortable  HENT: Normocephalic and atraumatic. Dry mucous membranes.  Eyes: EOM are normal.   Cardiovascular: Regular rate, regular rhythm. No murmurs, rubs, or gallops. Normal radial and PT pulses bilaterally. No LE edema.  Pulmonary: Normal respiratory effort. No wheezes, rales, or rhonchi. No crackles.   Abdominal: Soft. Non-distended. No tenderness. Normal bowel sounds.  Musculoskeletal: Normal range of motion.     Neurological: Alert and oriented to person, place, and time. Non-focal. Skin: warm and dry. Decreased skin turgor.   Assessment & Plan:   See Encounters Tab for problem based charting.  Patient seen with Dr. Jimmye Norman

## 2022-02-23 NOTE — Progress Notes (Signed)
Upon assessing blood pressure , I called to speak to ED charge nurse and connected with Legrand Como, RN inquiring about possibility of direct transfer of patient to ED without waiting in ED.  He kindly agreed to accept [name] to room 16, requesting that we bring patient to the desk at EMS entrance.  [name] is alert and not experiencing dizziness, dyspnea, or pain.  Progress note for visit in [facility] is being written now.  Patient was taken in transport chair by our nurse as instructed.

## 2022-02-23 NOTE — Hospital Course (Addendum)
He started feeling dizzy 1/29 at work and nurse there checked BP and glucose and it was unreadable at that time.  His appetite has been low the last several weeks and he has lost 20 lbs since 12/23.   Dx of dm- over a year ago Ever been on insulin  Denies abd pain, SOB, chest pain,   globus sensation in throat  Initial BP 65/48 Glucose >600 CBC 13.3, N 77% Creatinine 2, BUN 36 K 4 VBG 7.3 Na 128 due to hyperg CXR clear  ED course:  LR bolus 2L,  Seen in clinic this morning with weight gain presents w hyperglycemia and hypotension.  PMH: HTN, HLD, T2DM, CVA 2018, tobacci use disorder  Meds:  Amlodipine 10 mg pm Lisinopril 40 mg Ezetimibe  Chlorthalidone 12.5 mg Asa 81 Atorvastina 80 mg Restarted metformin 500 mg No cialis  Surg:  Soc: Lives by himself works at [employer] as [employer] [place] since he was 67 yo Does not drink alcohol  Hyperglycemia A1c 8.8%  ekg  Hypotension  AKI   Weight loss  Hx CVA LDL 89 12/23  Follow Up Butch Penny at [facility] -Smoking cessation -HTN meds -   Consults   -Discontinue Metformin

## 2022-02-23 NOTE — Assessment & Plan Note (Signed)
Patient has a history of T2DM. In the past, patient was unable to tolerate increase in metformin due to GI side effects. He was previously on SGLT2i which was discontinued due to frequent yeast infection. Patient last seen in clinic for this condition in 12/2021. Metformin was restarted at that time. Current medications include metFORMIN 500 mg daily and sitagliptin 50 mg daily. Patient states that they are compliant with these medications. Patient does not check their blood sugar at home regularly. Patient endorses polyuria, polydipsia, fatigue over the last few weeks. Patient states that they do visit the ophthalmologist for yearly eye exams. A1c was 8.8% one month ago. Given patients body habitus, I am uncertain if his initial diagnosis of type 2 diabetes was correct. His presentation is concerning for potentially having type 1 diabetes. He will need Ab testing to confirm this diagnosis.   Plan: - Urine ACR today - Continue metFORMIN 500 mg daily and sitagliptin 50 mg daily

## 2022-02-23 NOTE — Assessment & Plan Note (Signed)
Patient has a history of HTN. Current medications include lisinopril 40 mg daily, amLODipine 10 mg daily, and chlorthalidone 25 mg daily (0.5 tablet daily). Patient reports that his chlorthalidone was increased to a full tablet at his last visit however this is not documented. He has been taking a full tablet of chlorthalidone since then. Patient states that they are compliant with these medications. Patient states that they do not check their BP regularly at home. Patient denies HA, CP, or SOB. He has had lightheadedness since yesterday. Initial BP today is 65/51. Repeat BP is 65/48. Patient given water in clinic and had his BP rechecked. Systolic BP improved to the 110s however orthostatic vital signs were positive here in clinic. The cause of his hypotension is uncertain at this time but is likely secondary to polyuria from uncontrolled diabetes. Other items in the differential include sepsis, adrenal insufficiency, active bleeding, heart failure, excessive BP control. He is afebrile and but tachycardic. He is warm on exam but with dry mucous membranes and decreased skin turgor. I believe he needs further testing including CBC, BNP, and potentially morning cortisol to further characterize his hypotension.    Plan: - Patient was sent directly up to the ED room 30 given his hypotension and severe hypoglycemia - Patient will need reevaluation of his antihypertensive regimen following further workup for hypotension

## 2022-02-24 ENCOUNTER — Ambulatory Visit: Payer: Medicare HMO | Admitting: Podiatry

## 2022-02-24 ENCOUNTER — Encounter (HOSPITAL_COMMUNITY): Payer: Self-pay | Admitting: Student in an Organized Health Care Education/Training Program

## 2022-02-24 DIAGNOSIS — N179 Acute kidney failure, unspecified: Secondary | ICD-10-CM | POA: Diagnosis not present

## 2022-02-24 DIAGNOSIS — E860 Dehydration: Secondary | ICD-10-CM | POA: Diagnosis not present

## 2022-02-24 DIAGNOSIS — E1165 Type 2 diabetes mellitus with hyperglycemia: Secondary | ICD-10-CM

## 2022-02-24 DIAGNOSIS — Z794 Long term (current) use of insulin: Secondary | ICD-10-CM

## 2022-02-24 LAB — BASIC METABOLIC PANEL
Anion gap: 12 (ref 5–15)
BUN: 31 mg/dL — ABNORMAL HIGH (ref 8–23)
CO2: 30 mmol/L (ref 22–32)
Calcium: 9 mg/dL (ref 8.9–10.3)
Chloride: 92 mmol/L — ABNORMAL LOW (ref 98–111)
Creatinine, Ser: 1.76 mg/dL — ABNORMAL HIGH (ref 0.61–1.24)
GFR, Estimated: 42 mL/min — ABNORMAL LOW (ref >60)
Glucose, Bld: 223 mg/dL — ABNORMAL HIGH (ref 70–99)
Potassium: 3.6 mmol/L (ref 3.5–5.1)
Sodium: 134 mmol/L — ABNORMAL LOW (ref 135–145)

## 2022-02-24 LAB — HIV ANTIBODY (ROUTINE TESTING W REFLEX): HIV Screen 4th Generation wRfx: NONREACTIVE

## 2022-02-24 LAB — OSMOLALITY: Osmolality: 302 mOsm/kg — ABNORMAL HIGH (ref 275–295)

## 2022-02-24 LAB — CBC
HCT: 39.6 % (ref 39.0–52.0)
Hemoglobin: 13.9 g/dL (ref 13.0–17.0)
MCH: 29.3 pg (ref 26.0–34.0)
MCHC: 35.1 g/dL (ref 30.0–36.0)
MCV: 83.5 fL (ref 80.0–100.0)
Platelets: 205 10*3/uL (ref 150–400)
RBC: 4.74 MIL/uL (ref 4.22–5.81)
RDW: 12.2 % (ref 11.5–15.5)
WBC: 14.8 10*3/uL — ABNORMAL HIGH (ref 4.0–10.5)
nRBC: 0 % (ref 0.0–0.2)

## 2022-02-24 LAB — GLUCOSE, CAPILLARY
Glucose-Capillary: 269 mg/dL — ABNORMAL HIGH (ref 70–99)
Glucose-Capillary: 291 mg/dL — ABNORMAL HIGH (ref 70–99)
Glucose-Capillary: 208 mg/dL — ABNORMAL HIGH (ref 70–99)
Glucose-Capillary: 92 mg/dL (ref 70–99)
Glucose-Capillary: 286 mg/dL — ABNORMAL HIGH (ref 70–99)

## 2022-02-24 LAB — POTASSIUM: Potassium: 3 mmol/L — ABNORMAL LOW (ref 3.5–5.1)

## 2022-02-24 LAB — CBG MONITORING, ED
Glucose-Capillary: 128 mg/dL — ABNORMAL HIGH (ref 70–99)
Glucose-Capillary: 230 mg/dL — ABNORMAL HIGH (ref 70–99)

## 2022-02-24 MED ORDER — POTASSIUM CHLORIDE CRYS ER 20 MEQ PO TBCR
40.0000 meq | EXTENDED_RELEASE_TABLET | Freq: Once | ORAL | Status: AC
  Administered 2022-02-24: 40 meq via ORAL
  Filled 2022-02-24: qty 2

## 2022-02-24 MED ORDER — INSULIN GLARGINE-YFGN 100 UNIT/ML ~~LOC~~ SOLN
14.0000 [IU] | Freq: Every day | SUBCUTANEOUS | Status: DC
  Administered 2022-02-24: 14 [IU] via SUBCUTANEOUS
  Filled 2022-02-24 (×2): qty 0.14

## 2022-02-24 MED ORDER — ADULT MULTIVITAMIN W/MINERALS CH
1.0000 | ORAL_TABLET | Freq: Every day | ORAL | Status: DC
  Administered 2022-02-24 – 2022-02-25 (×2): 1 via ORAL
  Filled 2022-02-24 (×2): qty 1

## 2022-02-24 MED ORDER — GLUCERNA SHAKE PO LIQD
237.0000 mL | Freq: Three times a day (TID) | ORAL | Status: DC
  Administered 2022-02-24 – 2022-02-25 (×2): 237 mL via ORAL

## 2022-02-24 MED ORDER — EZETIMIBE 10 MG PO TABS
10.0000 mg | ORAL_TABLET | Freq: Every day | ORAL | Status: DC
  Administered 2022-02-24 – 2022-02-25 (×2): 10 mg via ORAL
  Filled 2022-02-24 (×2): qty 1

## 2022-02-24 MED ORDER — ATORVASTATIN CALCIUM 80 MG PO TABS
80.0000 mg | ORAL_TABLET | Freq: Every day | ORAL | Status: DC
  Administered 2022-02-24 – 2022-02-25 (×2): 80 mg via ORAL
  Filled 2022-02-24 (×2): qty 1

## 2022-02-24 NOTE — Inpatient Diabetes Management (Signed)
Inpatient Diabetes Program Recommendations  AACE/ADA: New Consensus Statement on Inpatient Glycemic Control (2015)  Target Ranges:  Prepandial:   less than 140 mg/dL      Peak postprandial:   less than 180 mg/dL (1-2 hours)      Critically ill patients:  140 - 180 mg/dL   Lab Results  Component Value Date   GLUCAP 208 (H) 02/24/2022   HGBA1C 14.3 (H) 02/23/2022    Review of Glycemic Control  Diabetes history: DM 2 Outpatient Diabetes medications: Januvia 50 mg Daily, Metformin 500 mg Daily (started last week) Current orders for Inpatient glycemic control:  Semglee 14 units qhs Novolog 0-15 units tid  A1c 14.3% on 1/30  Spoke with pt at bedside regarding A1c levels and glucose control at home. Pt reports being on Januvia for awhile but was only started on Metformin 500 mg Daily last week. Pt reports that he had not been checking glucose trends regularly. Pt had a doctors visit in December and A1c at that time was 8.8%. Pt cannot recall anything that would cause a sudden spike in glucose trends. Discussed glucose and A1c goals. Encouraged glucose checks at least 1-2 times a day. Discussed the need for insulin injections at time of discharge. Educated patient on insulin pen use at home. Reviewed all steps if insulin pen including attachment of needle, 2-unit air shot, dialing up dose, giving injection, removing needle, disposal of sharps, storage of unused insulin, disposal of insulin etc.  Also reviewed troubleshooting with insulin pen. MD to give patient Rxs for insulin pens and insulin pen needles.   Thanks,  Tama Headings RN, MSN, BC-ADM Inpatient Diabetes Coordinator Team Pager [phone] (8a-5p)

## 2022-02-24 NOTE — Progress Notes (Signed)
Initial Nutrition Assessment  DOCUMENTATION CODES:   Non-severe (moderate) malnutrition in context of chronic illness  INTERVENTION:  Encourage adequate PO intake with emphasis on balanced nutrition for blood sugar control Glucerna Shake po TID, each supplement provides 220 kcal and 10 grams of protein MVI with minerals daily "Plate Method" and "Carbohydrate Counting for People with Diabetes" handout added to AVS  NUTRITION DIAGNOSIS:  Moderate Malnutrition related to chronic illness (uncontrolled DM) as evidenced by mild fat depletion, moderate muscle depletion.  GOAL:  Patient will meet greater than or equal to 90% of their needs  MONITOR:  PO intake, Supplement acceptance, Labs, Weight trends  REASON FOR ASSESSMENT:  Malnutrition Screening Tool    ASSESSMENT:  Pt admitted with hypotension and lightheadedness, found to have severe hyperglycemia causing dehydration and AKI. PMH significant for T2DM, HTN, HLD, tobacco use.  Spoke with pt at bedside. He is in good spirits. He states that his PO intake fluctuates. He will go periods of time where he will eat 2-3 meals for about 3 days and then may go the next few days without an appetite and will skip eatings anything for those days.   When he eats he recalls having a light breakfast including a sausage, egg and cheese mcmuffin, or a boiled egg. Lunch he eats from the cafeteria at work which includes a Radiographer, therapeutic but minimal fries. Dinner he enjoys cooking and is usually his best meal which includes steak and vegetables.   He states that recently he was feeling very thirsty and was drinking lots of juice, soda and fruit. Explained this can be a sign of hyperglycemia.   Pt states that he has lost 20 lbs for unknown reason since December. He recalls weighing 177 lbs and is now 157 lbs. Reviewed documentation of weight history. Noted pt with an 11.7% weight loss between 12/30/21-02/23/22.   Provided diabetes nutrition education to pt  during visit. We discussed foods that increase blood sugar and recommendation to eat more consistently (trying to include protein and carbohydrate source). Discussed how to best balance meals for blood sugar control. Encouraged pt to eat his fruits versus drinking fruit juices and drinking diet sodas versus regular sodas. Handouts added to AVS for patient to review and guide balanced nutritional intake.   Medications: SSI 0-15 units TID, semglee 14 units qhs, MVI  Labs: HgbA1c 14.3%, CBG's 128-291 x24 hours  NUTRITION - FOCUSED PHYSICAL EXAM: Flowsheet Row Most Recent Value  Orbital Region Mild depletion  Upper Arm Region Moderate depletion  Thoracic and Lumbar Region No depletion  Buccal Region Mild depletion  Temple Region Mild depletion  Clavicle Bone Region Moderate depletion  Clavicle and Acromion Bone Region Moderate depletion  Scapular Bone Region Moderate depletion  Dorsal Hand Moderate depletion  Patellar Region Moderate depletion  Anterior Thigh Region Moderate depletion  Posterior Calf Region Moderate depletion  Edema (RD Assessment) None  Hair Reviewed  Eyes Reviewed  Mouth Reviewed  Skin Reviewed  Nails Reviewed      Diet Order:   Diet Order             Diet Carb Modified Fluid consistency: Thin; Room service appropriate? Yes  Diet effective now                   EDUCATION NEEDS:   Education needs have been addressed  Skin:  Skin Assessment: Reviewed RN Assessment  Last BM:  1/30  Height:   Ht Readings from Last 1 Encounters:  02/23/22 '6\' 1"'$  (  1.854 m)    Weight:   Wt Readings from Last 1 Encounters:  02/23/22 71.2 kg   BMI:  Body mass index is 20.71 kg/m.  Estimated Nutritional Needs:   Kcal:  2100-2300  Protein:  105-120g  Fluid:  >/=2L  Seth [name], RDN, LDN Clinical Nutrition

## 2022-02-24 NOTE — ED Notes (Signed)
Pt currently eating meal tray per provider

## 2022-02-24 NOTE — ED Notes (Signed)
ED TO INPATIENT HANDOFF REPORT  ED Nurse Name and Phone #: Jacqulyn Bath [phone]  S Name/Age/Gender Seth [name] 67 y.o. male Room/Bed: 042C/042C  Code Status   Code Status: Full Code  Home/SNF/Other Home Patient oriented to: self, place, time, and situation Is this baseline? Yes   Triage Complete: Triage complete  Chief Complaint Hyperglycemia due to diabetes mellitus [facility]) [E11.65]  Triage Note Patient arrived to the ED from internal medicine. Internal medicine stated systolic BP was 65. Patient states he was feeling yesterday, but not today. Patient states he hasn't had an appetite so hasn't been eating a lot. Patient does not take insulin. Patient is A&O x4.   Allergies No Known Allergies  Level of Care/Admitting Diagnosis ED Disposition     ED Disposition  Admit   Condition  --   Comment  Hospital Area: [facility] [[id number]]  Level of Care: Telemetry Medical [104]  May place patient in observation at [facility] or [place] if equivalent level of care is available:: No  Covid Evaluation: Asymptomatic - no recent exposure (last 10 days) testing not required  Diagnosis: Hyperglycemia due to diabetes mellitus [facility]) [[id number]]  Admitting Physician: Axel Filler [[id number]]  Attending Physician: Axel Filler [[id number]]          B Medical/Surgery History Past Medical History:  Diagnosis Date   CVA (cerebral vascular accident) ([place]) 12/2015   Diabetes ([place]) 2017   Hyperlipidemia    Hypertension    Past Surgical History:  Procedure Laterality Date   COLONOSCOPY  2019   CYST REMOVAL LEG     INGUINAL HERNIA REPAIR Right 09/13/2018   Procedure: RIGHT INGUINAL HERNIA REPAIR WITH MESH;  Surgeon: Donnie Mesa, MD;  Location: [facility];  Service: General;  Laterality: Right;  GENERAL AND TAP BLOCK     A IV Location/Drains/Wounds Patient Lines/Drains/Airways Status     Active Line/Drains/Airways     Name Placement date  Placement time Site Days   Peripheral IV 02/23/22 20 G Right Antecubital 02/23/22  1735  Antecubital  1   Peripheral IV 02/23/22 20 G 1" Anterior;Right Forearm 02/23/22  2340  Forearm  1   Incision (Closed) 09/13/18 Groin Right 09/13/18  1039  -- 1260            Intake/Output Last 24 hours  Intake/Output Summary (Last 24 hours) at 02/24/2022 0034 Last data filed at 02/23/2022 2328 Gross per 24 hour  Intake 2070.27 ml  Output --  Net 2070.27 ml    Labs/Imaging Results for orders placed or performed during the hospital encounter of 02/23/22 (from the past 48 hour(s))  Comprehensive metabolic panel     Status: Abnormal   Collection Time: 02/23/22  5:22 PM  Result Value Ref Range   Sodium 128 (L) 135 - 145 mmol/L   Potassium 4.1 3.5 - 5.1 mmol/L   Chloride 85 (L) 98 - 111 mmol/L   CO2 28 22 - 32 mmol/L   Glucose, Bld 539 (HH) 70 - 99 mg/dL    Comment: CRITICAL RESULT CALLED TO, READ BACK BY AND VERIFIED WITH IOrene Desanctis, RN @ 1927 02/24/27 BY SEKDAHL Glucose reference range applies only to samples taken after fasting for at least 8 hours.    BUN 36 (H) 8 - 23 mg/dL   Creatinine, Ser 2.08 (H) 0.61 - 1.24 mg/dL   Calcium 9.0 8.9 - 10.3 mg/dL   Total Protein 7.0 6.5 - 8.1 g/dL   Albumin 3.8 3.5 - 5.0  g/dL   AST 28 15 - 41 U/L   ALT 26 0 - 44 U/L   Alkaline Phosphatase 90 38 - 126 U/L   Total Bilirubin 1.0 0.3 - 1.2 mg/dL   GFR, Estimated 34 (L) >60 mL/min    Comment: (NOTE) Calculated using the CKD-EPI Creatinine Equation (2021)    Anion gap 15 5 - 15    Comment: Performed at [facility] [address] [place], [place] [address]  CBC with Differential     Status: Abnormal   Collection Time: 02/23/22  5:22 PM  Result Value Ref Range   WBC 13.3 (H) 4.0 - 10.5 K/uL   RBC 5.07 4.22 - 5.81 MIL/uL   Hemoglobin 15.2 13.0 - 17.0 g/dL   HCT 42.8 39.0 - 52.0 %   MCV 84.4 80.0 - 100.0 fL   MCH 30.0 26.0 - 34.0 pg   MCHC 35.5 30.0 - 36.0 g/dL   RDW 12.4 11.5 - 15.5 %    Platelets 215 150 - 400 K/uL   nRBC 0.0 0.0 - 0.2 %   Neutrophils Relative % 77 %   Neutro Abs 10.3 (H) 1.7 - 7.7 K/uL   Lymphocytes Relative 15 %   Lymphs Abs 2.0 0.7 - 4.0 K/uL   Monocytes Relative 6 %   Monocytes Absolute 0.8 0.1 - 1.0 K/uL   Eosinophils Relative 1 %   Eosinophils Absolute 0.1 0.0 - 0.5 K/uL   Basophils Relative 1 %   Basophils Absolute 0.1 0.0 - 0.1 K/uL   Immature Granulocytes 0 %   Abs Immature Granulocytes 0.05 0.00 - 0.07 K/uL    Comment: Performed at [facility] [address], [place], [place] [address]  CBG monitoring, ED     Status: Abnormal   Collection Time: 02/23/22  5:49 PM  Result Value Ref Range   Glucose-Capillary 547 (HH) 70 - 99 mg/dL    Comment: Glucose reference range applies only to samples taken after fasting for at least 8 hours.   Comment 1 Document in Chart   Urinalysis, Routine w reflex microscopic -Urine, Clean Catch     Status: Abnormal   Collection Time: 02/23/22  5:56 PM  Result Value Ref Range   Color, Urine YELLOW YELLOW   APPearance CLEAR CLEAR   Specific Gravity, Urine 1.014 1.005 - 1.030   pH 5.0 5.0 - 8.0   Glucose, UA >=500 (A) NEGATIVE mg/dL   Hgb urine dipstick NEGATIVE NEGATIVE   Bilirubin Urine NEGATIVE NEGATIVE   Ketones, ur NEGATIVE NEGATIVE mg/dL   Protein, ur NEGATIVE NEGATIVE mg/dL   Nitrite NEGATIVE NEGATIVE   Leukocytes,Ua NEGATIVE NEGATIVE   RBC / HPF 0-5 0 - 5 RBC/hpf   WBC, UA 0-5 0 - 5 WBC/hpf   Bacteria, UA RARE (A) NONE SEEN   Squamous Epithelial / HPF 0-5 0 - 5 /HPF   Mucus PRESENT    Hyaline Casts, UA PRESENT     Comment: Performed at [facility], [address] [place], [place] [address]  I-Stat venous blood gas, ED     Status: Abnormal   Collection Time: 02/23/22  6:25 PM  Result Value Ref Range   pH, Ven 7.324 7.25 - 7.43   pCO2, Ven 60.2 (H) 44 - 60 mmHg   pO2, Ven 40 32 - 45 mmHg   Bicarbonate 31.3 (H) 20.0 - 28.0 mmol/L   TCO2 33 (H) 22 - 32 mmol/L   O2 Saturation 70 %  Acid-Base Excess 3.0 (H) 0.0 - 2.0 mmol/L   Sodium 127 (L) 135 - 145 mmol/L   Potassium 3.9 3.5 - 5.1 mmol/L   Calcium, Ion 1.11 (L) 1.15 - 1.40 mmol/L   HCT 46.0 39.0 - 52.0 %   Hemoglobin 15.6 13.0 - 17.0 g/dL   Sample type VENOUS   I-stat chem 8, ED     Status: Abnormal   Collection Time: 02/23/22  6:26 PM  Result Value Ref Range   Sodium 128 (L) 135 - 145 mmol/L   Potassium 4.0 3.5 - 5.1 mmol/L   Chloride 88 (L) 98 - 111 mmol/L   BUN 36 (H) 8 - 23 mg/dL   Creatinine, Ser 2.00 (H) 0.61 - 1.24 mg/dL   Glucose, Bld 536 (HH) 70 - 99 mg/dL    Comment: Glucose reference range applies only to samples taken after fasting for at least 8 hours.   Calcium, Ion 1.10 (L) 1.15 - 1.40 mmol/L   TCO2 29 22 - 32 mmol/L   Hemoglobin 16.0 13.0 - 17.0 g/dL   HCT 47.0 39.0 - 52.0 %   Comment NOTIFIED PHYSICIAN   CBG monitoring, ED     Status: Abnormal   Collection Time: 02/23/22  7:52 PM  Result Value Ref Range   Glucose-Capillary 465 (H) 70 - 99 mg/dL    Comment: Glucose reference range applies only to samples taken after fasting for at least 8 hours.  CBG monitoring, ED     Status: Abnormal   Collection Time: 02/23/22  9:06 PM  Result Value Ref Range   Glucose-Capillary 349 (H) 70 - 99 mg/dL    Comment: Glucose reference range applies only to samples taken after fasting for at least 8 hours.  Hemoglobin A1c     Status: Abnormal   Collection Time: 02/23/22  9:24 PM  Result Value Ref Range   Hgb A1c MFr Bld 14.3 (H) 4.8 - 5.6 %    Comment: REPEATED TO VERIFY (NOTE) Pre diabetes:          5.7%-6.4%  Diabetes:              >6.4%  Glycemic control for   <7.0% adults with diabetes    Mean Plasma Glucose 363.71 mg/dL    Comment: Performed at [facility] [address] [place], [place] [address]  CBG monitoring, ED     Status: Abnormal   Collection Time: 02/23/22 10:36 PM  Result Value Ref Range   Glucose-Capillary 225 (H) 70 - 99 mg/dL    Comment: Glucose reference range  applies only to samples taken after fasting for at least 8 hours.  Potassium     Status: Abnormal   Collection Time: 02/23/22 11:44 PM  Result Value Ref Range   Potassium 3.0 (L) 3.5 - 5.1 mmol/L    Comment: Performed at [facility] [address], [place], [place] [address]  CBG monitoring, ED     Status: Abnormal   Collection Time: 02/24/22 12:02 AM  Result Value Ref Range   Glucose-Capillary 128 (H) 70 - 99 mg/dL    Comment: Glucose reference range applies only to samples taken after fasting for at least 8 hours.   DG Chest Port 1 View  Result Date: 02/23/2022 CLINICAL DATA:  Weakness. EXAM: PORTABLE CHEST 1 VIEW COMPARISON:  None Available. FINDINGS: 1823 hours. Lordotic positioning. The heart size and mediastinal contours are normal. The lungs are clear. There is no pleural effusion or pneumothorax. No acute osseous  findings are identified. Telemetry leads overlie the chest. IMPRESSION: No evidence of active cardiopulmonary process. Electronically Signed   By: Richardean Sale M.D.   On: 02/23/2022 18:34    Pending Labs Unresulted Labs (From admission, onward)     Start     Ordered   02/24/22 0500  HIV Antibody (routine testing w rflx)  (HIV Antibody (Routine testing w reflex) panel)  Tomorrow morning,   R        02/23/22 2037   02/24/22 8889  Basic metabolic panel  (Hyperglycemia (not DKA or HHS))  Daily,   R      02/23/22 2037   02/24/22 0500  CBC  Tomorrow morning,   R        02/24/22 0032   02/23/22 2035  Osmolality  (Hyperglycemia (not DKA or HHS))  Once,   R        02/23/22 2037            Vitals/Pain Today's Vitals   02/23/22 1945 02/23/22 2000 02/23/22 2133 02/23/22 2330  BP: 122/80 138/79    Pulse: 74 76  77  Resp: '18 18  20  '$ Temp:   98 F (36.7 C)   TempSrc:   Oral   SpO2: 100% 100%  100%  Weight:      Height:      PainSc:        Isolation Precautions No active isolations  Medications Medications  rivaroxaban (XARELTO) tablet 10 mg (10 mg  Oral Given 02/23/22 2112)  lactated ringers infusion ( Intravenous New Bag/Given 02/23/22 2344)  insulin aspart (novoLOG) injection 0-15 Units (has no administration in time range)  potassium chloride SA (KLOR-CON M) CR tablet 40 mEq (has no administration in time range)  lactated ringers bolus 2,136 mL (0 mLs Intravenous Stopped 02/23/22 2005)  insulin aspart (novoLOG) injection 10 Units (10 Units Intravenous Given 02/23/22 2026)  insulin glargine-yfgn (SEMGLEE) injection 10 Units (10 Units Subcutaneous Given 02/24/22 0005)    Mobility walks     Focused Assessments Pt sent from provider for SBP 65. Pt asymptomatic. Upon arrival, SBP in 90s and Glucose in 600s. No history of diabetes. No signs of DKA. Pt on insulin drip until about 2330 and now on subcut injections. Pt allowed and encouraged to eat per provider. Pt has been eating since midnight. Most recent glucose in 120s.   R Recommendations: See Admitting Provider Note  Report given to:   Additional Notes:

## 2022-02-24 NOTE — Discharge Instructions (Signed)
You were hospitalized for low blood pressure and high blood sugar. I am glad to hear you are feeling better. You will start taking insulin at home. See below for your medication changes. Thank you for allowing [place] to be part of your care.   -Inject 14 units of Lantus (insulin pen) at bedtime.  -Restart your Januvia. -Stop metformin and your blood pressure medicines. Please follow up with our clinic to discuss restarting them.  -Check blood sugar daily  We arranged for you to follow up at:  [facility] ([facility] at ground [facility] this hospital) 03/02/2022 9:15 AM with Lupita Leash (diabetes coordinator) 03/02/2022 10:45 AM with Dr. Austin Miles  Please note these changes made to your medications:  *Please START taking:  -Lantus 14 units at bedtime  *Please STOP taking:  -STOP metformin, amlodipine, lisinopril and chlorthalidone (please see your primary doctor to discuss restarting these medications)   Please make sure to check your blood sugar daily. You can check in morning and at bedtime. Contact the clinic if you have any concerns of low blood sugar (less than 70) or persistently high blood sugar.   Please call our clinic if you have any questions or concerns, we may be able to help and keep you from a long and expensive emergency room wait. Our clinic and after hours phone number is [phone], the best time to call is Monday through Friday 9 am to 4 pm but there is always someone available 24/7 if you have an emergency.   -----------------------------------------------  Plate Method for Diabetes   Foods with carbohydrates make your blood glucose level go up. The plate method is a simple way to meal plan and control the amount of carbohydrate you eat.         Use the following guidance to build a healthy plate to control carbohydrates. Divide a 9-inch plate into 3 sections, and consider your beverage the 4th section of your meal: Food Group Examples of Foods/Beverages for This Section  of your Meal  Section 1: Non-starchy vegetables Fill  of your plate to include non-starchy vegetables Asparagus, broccoli, brussels sprouts, cabbage, carrots, cauliflower, celery, cucumber, green beans, mushrooms, peppers, salad greens, tomatoes, or zucchini.  Section 2: Protein foods Fill  of your plate to include a lean protein Lean meat, poultry, fish, seafood, cheese, eggs, lean deli meat, tofu, beans, lentils, nuts or nut butters.  Section 3: Carbohydrate foods Fill  of your plate to include carbohydrate foods Whole grains, whole wheat bread, brown rice, whole grain pasta, polenta, corn tortillas, fruit, or starchy vegetables (potatoes, green peas, corn, beans, acorn squash, and butternut squash). One cup of milk also counts as a food that contains carbohydrate.  Section 4: Beverage Choose water or a low-calorie drink for your beverage. Unsweetened tea, coffee, or flavored/sparkling water without added sugar.  Image reprinted with permission from The American Diabetes Association.  Copyright 2022 by the American Diabetes Association.   Copyright 2022  Academy of Nutrition and Dietetics. All rights reserved      Carbohydrate Counting For People With Diabetes  Foods with carbohydrates make your blood glucose level go up. Learning how to count carbohydrates can help you control your blood glucose levels. First, identify the foods you eat that contain carbohydrates. Then, using the Foods with Carbohydrates chart, determine about how much carbohydrates are in your meals and snacks. Make sure you are eating foods with fiber, protein, and healthy fat along with your carbohydrate foods.  Foods with Carbohydrates The following  table shows carbohydrate foods that have about 15 grams of carbohydrate each. Using measuring cups, spoons, or a food scale when you first begin learning about carbohydrate counting can help you learn about the portion sizes you typically eat.  The following foods have  15 grams carbohydrate each:  Grains 1 slice bread (1 ounce)  1 small tortilla (6-inch size)   large bagel (1 ounce)  1/3 cup pasta or rice (cooked)   hamburger or hot dog bun ( ounce)   cup cooked cereal   to  cup ready-to-eat cereal  2 taco shells (5-inch size) Fruit 1 small fresh fruit ( to 1 cup)   medium banana  17 small grapes (3 ounces)  1 cup melon or berries   cup canned or frozen fruit  2 tablespoons dried fruit (blueberries, cherries, cranberries, raisins)   cup unsweetened fruit juice  Starchy Vegetables  cup cooked beans, peas, corn, potatoes/sweet potatoes   large baked potato (3 ounces)  1 cup acorn or butternut squash  Snack Foods 3 to 6 crackers  8 potato chips or 13 tortilla chips ( ounce to 1 ounce)  3 cups popped popcorn  Dairy 3/4 cup (6 ounces) nonfat plain yogurt, or yogurt with sugar-free sweetener  1 cup milk  1 cup plain rice, soy, coconut or flavored almond milk Sweets and Desserts  cup ice cream or frozen yogurt  1 tablespoon jam, jelly, pancake syrup, table sugar, or honey  2 tablespoons light pancake syrup  1 inch square of frosted cake or 2 inch square of unfrosted cake  2 small cookies (2/3 ounce each) or  large cookie   Sometimes you'll have to estimate carbohydrate amounts if you don't know the exact recipe. One cup of mixed foods like soups can have 1 to 2 carbohydrate servings, while some casseroles might have 2 or more servings of carbohydrate. Foods that have less than 20 calories in each serving can be counted as "free" foods. Count 1 cup raw vegetables, or  cup cooked non-starchy vegetables as "free" foods. If you eat 3 or more servings at one meal, then count them as 1 carbohydrate serving.   Foods without Carbohydrates  Not all foods contain carbohydrates. Meat, some dairy, fats, non-starchy vegetables, and many beverages don't contain carbohydrate. So when you count carbohydrates, you can generally exclude chicken, pork,  beef, fish, seafood, eggs, tofu, cheese, butter, sour cream, avocado, nuts, seeds, olives, mayonnaise, water, black coffee, unsweetened tea, and zero-calorie drinks. Vegetables with no or low carbohydrate include green beans, cauliflower, tomatoes, and onions.  How much carbohydrate should I eat at each meal?  Carbohydrate counting can help you plan your meals and manage your weight. Following are some starting points for carbohydrate intake at each meal. Work with your registered dietitian nutritionist to find the best range that works for your blood glucose and weight.   To Lose Weight To Maintain Weight  Women 2 - 3 carb servings 3 - 4 carb servings  Men 3 - 4 carb servings 4 - 5 carb servings  Checking your blood glucose after meals will help you know if you need to adjust the timing, type, or number of carbohydrate servings in your meal plan. Achieve and keep a healthy body weight by balancing your food intake and physical activity.  Tips How should I plan my meals?  Plan for half the food on your plate to include non-starchy vegetables, like salad greens, broccoli, or carrots. Try to eat 3 to 5 servings  of non-starchy vegetables every day. Have a protein food at each meal. Protein foods include chicken, fish, meat, eggs, or beans (note that beans contain carbohydrate). These two food groups (non-starchy vegetables and proteins) are low in carbohydrate. If you fill up your plate with these foods, you will eat less carbohydrate but still fill up your stomach. Try to limit your carbohydrate portion to  of the plate.   What fats are healthiest to eat?  Diabetes increases risk for heart disease. To help protect your heart, eat more healthy fats, such as olive oil, nuts, and avocado. Eat less saturated fats like butter, cream, and high-fat meats, like bacon and sausage. Avoid trans fats, which are in all foods that list "partially hydrogenated oil" as an ingredient.  What should I drink?  Choose  drinks that are not sweetened with sugar. The healthiest choices are water, carbonated or seltzer waters, and tea and coffee without added sugars.  Sweet drinks will make your blood glucose go up very quickly. One serving of soda or energy drink is  cup. It is best to drink these beverages only if your blood glucose is low.  Artificially sweetened, or diet drinks, typically do not increase your blood glucose if they have zero calories in them. Read labels of beverages, as some diet drinks do have carbohydrate and will raise your blood glucose.  Label Reading Tips Read Nutrition Facts labels to find out how many grams of carbohydrate are in a food you want to eat. Don't forget: sometimes serving sizes on the label aren't the same as how much food you are going to eat, so you may need to calculate how much carbohydrate is in the food you are serving yourself.   Carbohydrate Counting for People with Diabetes Sample 1-Day Menu  Breakfast  cup yogurt, low fat, low sugar (1 carbohydrate serving)   cup cereal, ready-to-eat, unsweetened (1 carbohydrate serving)  1 cup strawberries (1 carbohydrate serving)   cup almonds ( carbohydrate serving)  Lunch 1, 5 ounce can chunk light tuna  2 ounces cheese, low fat cheddar  6 whole wheat crackers (1 carbohydrate serving)  1 small apple (1 carbohydrate servings)   cup carrots ( carbohydrate serving)   cup snap peas  1 cup 1% milk (1 carbohydrate serving)   Evening Meal Stir fry made with: 3 ounces chicken  1 cup brown rice (3 carbohydrate servings)   cup broccoli ( carbohydrate serving)   cup green beans   cup onions  1 tablespoon olive oil  2 tablespoons teriyaki sauce ( carbohydrate serving)  Evening Snack 1 extra small banana (1 carbohydrate serving)  1 tablespoon peanut butter   Carbohydrate Counting for People with Diabetes Vegan Sample 1-Day Menu  Breakfast 1 cup cooked oatmeal (2 carbohydrate servings)   cup blueberries (1  carbohydrate serving)  2 tablespoons flaxseeds  1 cup soymilk fortified with calcium and vitamin D  1 cup coffee  Lunch 2 slices whole wheat bread (2 carbohydrate servings)   cup baked tofu   cup lettuce  2 slices tomato  2 slices avocado   cup baby carrots ( carbohydrate serving)  1 orange (1 carbohydrate serving)  1 cup soymilk fortified with calcium and vitamin D   Evening Meal Burrito made with: 1 6-inch corn tortilla (1 carbohydrate serving)  1 cup refried vegetarian beans (2 carbohydrate servings)   cup chopped tomatoes   cup lettuce   cup salsa  1/3 cup brown rice (1 carbohydrate serving)  1 tablespoon  olive oil for rice   cup zucchini   Evening Snack 6 small whole grain crackers (1 carbohydrate serving)  2 apricots ( carbohydrate serving)   cup unsalted peanuts ( carbohydrate serving)    Carbohydrate Counting for People with Diabetes Vegetarian (Lacto-Ovo) Sample 1-Day Menu  Breakfast 1 cup cooked oatmeal (2 carbohydrate servings)   cup blueberries (1 carbohydrate serving)  2 tablespoons flaxseeds  1 egg  1 cup 1% milk (1 carbohydrate serving)  1 cup coffee  Lunch 2 slices whole wheat bread (2 carbohydrate servings)  2 ounces low-fat cheese   cup lettuce  2 slices tomato  2 slices avocado   cup baby carrots ( carbohydrate serving)  1 orange (1 carbohydrate serving)  1 cup unsweetened tea  Evening Meal Burrito made with: 1 6-inch corn tortilla (1 carbohydrate serving)   cup refried vegetarian beans (1 carbohydrate serving)   cup tomatoes   cup lettuce   cup salsa  1/3 cup brown rice (1 carbohydrate serving)  1 tablespoon olive oil for rice   cup zucchini  1 cup 1% milk (1 carbohydrate serving)  Evening Snack 6 small whole grain crackers (1 carbohydrate serving)  2 apricots ( carbohydrate serving)   cup unsalted peanuts ( carbohydrate serving)    Copyright 2020  Academy of Nutrition and Dietetics. All rights reserved.  Using  Nutrition Labels: Carbohydrate  Serving Size  Look at the serving size. All the information on the label is based on this portion. Servings Per Container  The number of servings contained in the package. Guidelines for Carbohydrate  Look at the total grams of carbohydrate in the serving size.  1 carbohydrate choice = 15 grams of carbohydrate. Range of Carbohydrate Grams Per Choice  Carbohydrate Grams/Choice Carbohydrate Choices  6-10   11-20 1  21-25 1  26-35 2  36-40 2  41-50 3  51-55 3  56-65 4  66-70 4  71-80 5    Copyright 2020  Academy of Nutrition and Dietetics. All rights reserved.

## 2022-02-24 NOTE — Progress Notes (Signed)
Subjective:  Patient is lying comfortably in bed. He reports he is feeling better today although still not hungry and is eager to be out of bed. He states that he has periods of appetite changes every year, but this is the first time he has lost significant weight. He denies recent illnesses, night sweats, chills, nausea, or vomiting. He endorses taking his medications with a few missed doses, states he does not check his blood sugar often at home.   Objective:  Vital signs in last 24 hours: Vitals:   02/24/22 0115 02/24/22 0405 02/24/22 0435 02/24/22 0756  BP:  95/69 96/72 112/70  Pulse: 82 61 71 80  Resp: 18 (!) 22 16   Temp:    97.7 F (36.5 C)  TempSrc:    Oral  SpO2: 100% 100% 97% 100%  Weight:      Height:       Weight change:   Intake/Output Summary (Last 24 hours) at 02/24/2022 1027 Last data filed at 02/24/2022 0400 Gross per 24 hour  Intake 2470.27 ml  Output 450 ml  Net 2020.27 ml   Physical Exam General: Alert, well-appearing, In no acute distress. CV: RRR, no murmurs, rubs, or gallops Pulm: Normal respiratory effort on room air. Lungs clear to auscultation bilaterally.  Abd: Normoactive bowel sounds. Soft, non-tender, non-distended.  Skin: Warm and dry. Normal skin turgor.  Ext: No LE edema Neuro: Alert, oriented. Conversational.  Psych: Pleasant, appropriate affect.   Assessment/Plan:  Principal Problem:   Hyperglycemia due to diabetes mellitus ([place]) Active Problems:   HTN (hypertension)   Weight loss   AKI (acute kidney injury) ([place])  Seth [name] is a 67 y.o. man with past medical history significant for T2DM, HTN, HLD, and tobacco use disorder who presented for lightheadedness and admitted for significant hyperglycemia.   Uncontrolled T2DM Severe Hyperglycemia Dehydration Patient endorses feeling better today, glucose has improved to 269 this morning and blood pressure stable overnight. On exam he does not appear dry. Overall patient is  doing better this admission with IVF and insulin, although his increased A1c 8.8>>14.3 in two months on metformin and januvia raises concern for worsening pancreatic insulin production. Patient was started on insulin this admission, given 18 units over last 24h. Plan to optimize long-acting insulin regimen for him to continue at home and engage diabetes coordinator during this admission with [facility] diabetes coordinator follow-up outpatient.  -Semglee 14 units -Sliding scale insulin with meals -CBG check q4h -Hold home metformin and Januvia -Diabetic coordinator consult -Orthostatic vitals -Mobility specialist   AKI 2/2 Dehydration Baseline creatinine 0.9-1.1, last creatinine 1.76 after fluid resuscitation. Holding home antihypertensives in setting of hypotension on presentation, BPs have remained stable overnight.  -Hold home lisinopril and chlorthalidone -BMP   Recent Weight Loss Patient has a new 20lb (80.6kg>>71.4kg) weight loss in 2 months associated with decreased appetite, he denies other constitutional B symptoms. He endorses frequent appetite changes but did not have weight loss before. The timeframe of weight loss is concurrent with significantly increased A1c over the last two months, would expect weight to stabilize following better glucose control over next few months, otherwise may need further workup outpatient.  -Follow up weight outpatient  Leukocytosis  Patient appears well today with no signs of infection on exam, last WBC 14.6. Will continue to monitor.  -F/u AM CBC  Hx of CVA Hx of Hyperlipidemia  Last LDL 89 on 12/30/2021, goal <70 for secondary prevention. LDL may be elevated from non-adherence prior to  admission, will encourage patient to take regimen on discharge.  -Continue home Lipitor '80mg'$  -Continue home ezetimibe '10mg'$  -Continue home aspirin '81mg'$  -Follow up medication adherence outpatient   Tobacco use disorder  Patient is current smoker.  -Nicotine patch if  requested -Outpatient follow-up for smoking cessation  Pseudohyponatremia  -F/u AM BMP  Hx of HTN Holding home antihypertensives in setting of hypotension.  -Hold home amlodipine, lisinopril, and chlorthalidone.   Full Code VTE: Xarelto IVF: None Diet: Carb-modified    LOS: 0 days   Seth [name], Medical Student 02/24/2022, 10:27 AM

## 2022-02-25 ENCOUNTER — Encounter (HOSPITAL_COMMUNITY): Payer: Self-pay | Admitting: Student in an Organized Health Care Education/Training Program

## 2022-02-25 ENCOUNTER — Other Ambulatory Visit (HOSPITAL_COMMUNITY): Payer: Self-pay

## 2022-02-25 ENCOUNTER — Other Ambulatory Visit: Payer: Self-pay | Admitting: Student

## 2022-02-25 DIAGNOSIS — E440 Moderate protein-calorie malnutrition: Secondary | ICD-10-CM | POA: Diagnosis present

## 2022-02-25 DIAGNOSIS — Z823 Family history of stroke: Secondary | ICD-10-CM | POA: Diagnosis not present

## 2022-02-25 DIAGNOSIS — I959 Hypotension, unspecified: Secondary | ICD-10-CM | POA: Diagnosis present

## 2022-02-25 DIAGNOSIS — Z8673 Personal history of transient ischemic attack (TIA), and cerebral infarction without residual deficits: Secondary | ICD-10-CM | POA: Diagnosis not present

## 2022-02-25 DIAGNOSIS — D72829 Elevated white blood cell count, unspecified: Secondary | ICD-10-CM | POA: Diagnosis present

## 2022-02-25 DIAGNOSIS — N179 Acute kidney failure, unspecified: Secondary | ICD-10-CM | POA: Diagnosis not present

## 2022-02-25 DIAGNOSIS — I10 Essential (primary) hypertension: Secondary | ICD-10-CM | POA: Diagnosis present

## 2022-02-25 DIAGNOSIS — Z556 Problems related to health literacy: Secondary | ICD-10-CM | POA: Diagnosis not present

## 2022-02-25 DIAGNOSIS — Z7984 Long term (current) use of oral hypoglycemic drugs: Secondary | ICD-10-CM | POA: Diagnosis not present

## 2022-02-25 DIAGNOSIS — Z79899 Other long term (current) drug therapy: Secondary | ICD-10-CM | POA: Diagnosis not present

## 2022-02-25 DIAGNOSIS — Z794 Long term (current) use of insulin: Secondary | ICD-10-CM | POA: Diagnosis not present

## 2022-02-25 DIAGNOSIS — Z634 Disappearance and death of family member: Secondary | ICD-10-CM | POA: Diagnosis not present

## 2022-02-25 DIAGNOSIS — R9431 Abnormal electrocardiogram [ECG] [EKG]: Secondary | ICD-10-CM | POA: Diagnosis present

## 2022-02-25 DIAGNOSIS — E1165 Type 2 diabetes mellitus with hyperglycemia: Secondary | ICD-10-CM | POA: Diagnosis not present

## 2022-02-25 DIAGNOSIS — Z833 Family history of diabetes mellitus: Secondary | ICD-10-CM | POA: Diagnosis not present

## 2022-02-25 DIAGNOSIS — E785 Hyperlipidemia, unspecified: Secondary | ICD-10-CM | POA: Diagnosis present

## 2022-02-25 DIAGNOSIS — Z6820 Body mass index (BMI) 20.0-20.9, adult: Secondary | ICD-10-CM | POA: Diagnosis not present

## 2022-02-25 DIAGNOSIS — Z8249 Family history of ischemic heart disease and other diseases of the circulatory system: Secondary | ICD-10-CM | POA: Diagnosis not present

## 2022-02-25 DIAGNOSIS — F17210 Nicotine dependence, cigarettes, uncomplicated: Secondary | ICD-10-CM | POA: Diagnosis present

## 2022-02-25 DIAGNOSIS — E860 Dehydration: Secondary | ICD-10-CM | POA: Diagnosis not present

## 2022-02-25 DIAGNOSIS — Z7982 Long term (current) use of aspirin: Secondary | ICD-10-CM | POA: Diagnosis not present

## 2022-02-25 LAB — CBC
HCT: 36.3 % — ABNORMAL LOW (ref 39.0–52.0)
Hemoglobin: 12.8 g/dL — ABNORMAL LOW (ref 13.0–17.0)
MCH: 29.6 pg (ref 26.0–34.0)
MCHC: 35.3 g/dL (ref 30.0–36.0)
MCV: 83.8 fL (ref 80.0–100.0)
Platelets: 183 10*3/uL (ref 150–400)
RBC: 4.33 MIL/uL (ref 4.22–5.81)
RDW: 12.2 % (ref 11.5–15.5)
WBC: 10.7 10*3/uL — ABNORMAL HIGH (ref 4.0–10.5)
nRBC: 0 % (ref 0.0–0.2)

## 2022-02-25 LAB — BASIC METABOLIC PANEL
Anion gap: 10 (ref 5–15)
BUN: 24 mg/dL — ABNORMAL HIGH (ref 8–23)
CO2: 25 mmol/L (ref 22–32)
Calcium: 8.6 mg/dL — ABNORMAL LOW (ref 8.9–10.3)
Chloride: 96 mmol/L — ABNORMAL LOW (ref 98–111)
Creatinine, Ser: 1.32 mg/dL — ABNORMAL HIGH (ref 0.61–1.24)
GFR, Estimated: 59 mL/min — ABNORMAL LOW (ref >60)
Glucose, Bld: 352 mg/dL — ABNORMAL HIGH (ref 70–99)
Potassium: 3.7 mmol/L (ref 3.5–5.1)
Sodium: 131 mmol/L — ABNORMAL LOW (ref 135–145)

## 2022-02-25 LAB — GLUCOSE, CAPILLARY
Glucose-Capillary: 130 mg/dL — ABNORMAL HIGH (ref 70–99)
Glucose-Capillary: 131 mg/dL — ABNORMAL HIGH (ref 70–99)
Glucose-Capillary: 242 mg/dL — ABNORMAL HIGH (ref 70–99)
Glucose-Capillary: 300 mg/dL — ABNORMAL HIGH (ref 70–99)
Glucose-Capillary: 320 mg/dL — ABNORMAL HIGH (ref 70–99)

## 2022-02-25 LAB — MICROALBUMIN / CREATININE URINE RATIO
Creatinine, Urine: 77.4 mg/dL
Microalb/Creat Ratio: 118 mg/g creat — ABNORMAL HIGH (ref 0–29)
Microalbumin, Urine: 91 ug/mL

## 2022-02-25 MED ORDER — INSULIN STARTER KIT- PEN NEEDLES (ENGLISH)
1.0000 | Freq: Once | Status: DC
  Filled 2022-02-25: qty 1

## 2022-02-25 MED ORDER — ACCU-CHEK SOFTCLIX LANCETS MISC
0 refills | Status: AC
  Filled 2022-02-25: qty 100, 25d supply, fill #0

## 2022-02-25 MED ORDER — INSULIN ASPART 100 UNIT/ML IJ SOLN
5.0000 [IU] | Freq: Once | INTRAMUSCULAR | Status: AC
  Administered 2022-02-25: 5 [IU] via SUBCUTANEOUS

## 2022-02-25 MED ORDER — ACCU-CHEK GUIDE W/DEVICE KIT
PACK | 0 refills | Status: AC
  Filled 2022-02-25: qty 1, 30d supply, fill #0

## 2022-02-25 MED ORDER — LANTUS SOLOSTAR 100 UNIT/ML ~~LOC~~ SOPN
14.0000 [IU] | PEN_INJECTOR | Freq: Every day | SUBCUTANEOUS | 0 refills | Status: DC
  Filled 2022-02-25: qty 6, 42d supply, fill #0

## 2022-02-25 MED ORDER — GLUCOSE BLOOD VI STRP
ORAL_STRIP | 0 refills | Status: AC
  Filled 2022-02-25: qty 100, 25d supply, fill #0

## 2022-02-25 MED ORDER — TECHLITE PEN NEEDLES 32G X 4 MM MISC
0 refills | Status: AC
  Filled 2022-02-25: qty 100, 25d supply, fill #0

## 2022-02-25 NOTE — Inpatient Diabetes Management (Signed)
Inpatient Diabetes Program Recommendations  AACE/ADA: New Consensus Statement on Inpatient Glycemic Control (2015)  Target Ranges:  Prepandial:   less than 140 mg/dL      Peak postprandial:   less than 180 mg/dL (1-2 hours)      Critically ill patients:  140 - 180 mg/dL   Lab Results  Component Value Date   GLUCAP 300 (H) 02/25/2022   HGBA1C 14.3 (H) 02/23/2022    Latest Reference Range & Units 02/25/22 04:30 02/25/22 07:56 02/25/22 11:27  Glucose-Capillary 70 - 99 mg/dL 131 (H) 130 (H) 300 (H)  (H): Data is abnormally high  Diabetes history: DM 2 Outpatient Diabetes medications: Januvia 50 mg Daily, Metformin 500 mg Daily (started last week) Current orders for Inpatient glycemic control:  Semglee 14 units qhs Novolog 0-15 units tid  Inpatient Diabetes Program Recommendations:   Educated patient on insulin pen use at home. Reviewed contents of insulin flexpen starter kit. Reviewed all steps if insulin pen including attachment of needle, 2-unit air shot, dialing up dose, giving injection, removing needle, disposal of sharps, storage of unused insulin, disposal of insulin etc. Patient able to provide successful return demonstration. Also reviewed troubleshooting with insulin pen. MD to give patient Rxs for insulin pens and insulin pen needles.  Review basic plate method information with patient. Patient states he has been craving sugary drinks and juices, but agrees to decrease sugary drinks and foods and limit carbohydrates. Reviewed fruits less in sugar.  Discharge needs: -Lantus solostar insulin pen # [id number] -Insulin pen needles # [id number] -CBG meter and supplies # [id number]  Thank you, Seth [name]. Jani Ploeger, RN, MSN, CDE  Diabetes Coordinator Inpatient Glycemic Control Team Team Pager [phone] (8am-5pm) 02/25/2022 1:47 PM

## 2022-02-25 NOTE — Discharge Summary (Addendum)
Name: Seth [name] MRN: [id number] DOB: 1955/04/29 67 y.o. PCP: Gaylan Gerold, DO  Date of Admission: 02/23/2022  4:57 PM Date of Discharge: 02/25/2022 Attending Physician: Axel Filler, *  Discharge Diagnosis: 1. Principal Problem:   Hyperglycemia due to diabetes mellitus ([place]) Active Problems:   HTN (hypertension)   Weight loss   AKI (acute kidney injury) ([place])   Malnutrition of moderate degree   Discharge Medications: Allergies as of 02/25/2022   No Known Allergies      Medication List     STOP taking these medications    amLODipine 10 MG tablet Commonly known as: NORVASC   chlorthalidone 25 MG tablet Commonly known as: HYGROTON   lisinopril 40 MG tablet Commonly known as: ZESTRIL   metFORMIN 500 MG 24 hr tablet Commonly known as: GLUCOPHAGE-XR       TAKE these medications    Accu-Chek Guide test strip Generic drug: glucose blood Use as instructed What changed: Another medication with the same name was added. Make sure you understand how and when to take each.   Accu-Chek Guide test strip Generic drug: glucose blood Use as directed up to four times daily What changed: You were already taking a medication with the same name, and this prescription was added. Make sure you understand how and when to take each.   Accu-Chek Guide w/Device Kit Use as directed up to four times daily   Accu-Chek Softclix Lancets lancets Use as directed up to four times daily   acetaminophen 500 MG tablet Commonly known as: TYLENOL Take 1,000 mg by mouth daily as needed for mild pain, moderate pain, fever or headache.   Aspirin Low Dose 81 MG tablet Generic drug: aspirin EC TAKE 1 TABLET (81 MG TOTAL) BY MOUTH DAILY. SWALLOW WHOLE. What changed:  when to take this additional instructions   atorvastatin 80 MG tablet Commonly known as: LIPITOR TAKE 1 TABLET BY MOUTH DAILY AT 6 PM. What changed: when to take this   ezetimibe 10 MG tablet Commonly  known as: Zetia Take 1 tablet (10 mg total) by mouth daily. What changed: when to take this   Januvia 50 MG tablet Generic drug: sitaGLIPtin TAKE 1 TABLET BY MOUTH EVERY DAY   Lantus SoloStar 100 UNIT/ML Solostar Pen Generic drug: insulin glargine Inject 14 Units into the skin daily.   TechLite Pen Needles 32G X 4 MM Misc Generic drug: Insulin Pen Needle Use as directed up to four times daily        Disposition and follow-up:   [name] was discharged from [facility] in Good condition.  At the hospital follow up visit please address:  1.  Uncontrolled Type II Diabetes Patient started on Lantus 14 units, will need insulin management going forward. Assess for adherence on medications and daily glucose meter use. Assess for episodes of hypoglycemia.   2. Unintentional Weight Loss Patient had 20lb weight loss over the last two months with decreased appetite, is due for lung cancer screening. Please consider further workup.   3. Smoking Cessation Patient is still current smoker, although states he has cut down a lot. Consider smoking cessation.   4. Hx of HTN Antihypertensives were held during admission, consider if need to be restarted.   5. Labs / imaging needed at time of follow-up: BMP  6. Pending labs/ test needing follow-up: None  Follow-up Appointments: 03/02/22 @ 9:15AM  Seth [name] and 10:45AM Dr. Minerva [name] Course by problem list:  Uncontrolled  T2DM Severe Hyperglycemia Dehydration Patient initially presented to [facility] for lightheadedness, was found to be hypotensive and CBG>500 and and was sent to the ED for further evaluation. Patient also endorsed "overindulging" during the holidays, polyuria, polydipsia. Patient's A1c increased 8.8>>14.3 in 2 months. Patient's hyperglycemia improved with insulin drip, remained stable after transition to long acting insulin. Discharge with Lantus 14 units daily. Ordered new glucose meter kit and  supplies. Will f/u in [facility].   AKI 2/2 dehydration Patient's creatinine was 2.08, improved with fluid resuscitation initially and PO intake and creatinine was 1.32 on discharge   Unintentional Weight Loss Patient reports 20lb weight loss in 2 months, associated with decreased appetite. He states his appetite often waxes and wanes but this is the first time that he has lost weight. Etiology is likely secondary to worsened hyperglycemia over the last few months. Colonoscopy last year was normal, he is due for lung cancer screening. No further workup inpatient, will follow up outpatient.   Discharge Subjective: Patient states he feels well today, and is comfortable giving himself insulin. He reports he feels ready to go home today.   Discharge Exam:   BP 118/81 (BP Location: Right Arm)   Pulse 79   Temp 98.4 F (36.9 C) (Oral)   Resp 15   Ht '6\' 1"'$  (1.854 m)   Wt 71.2 kg   SpO2 98%   BMI 20.71 kg/m   General: Alert, in no acute distress.  CV: Normal rhythm Pulm: Normal respiratory effort on room air Skin: Warm and dry Neuro: A&Ox4 Psych: Pleasant, appropriate affect  Pertinent Labs, Studies, and Procedures:      Latest Ref Rng & Units 02/25/2022   12:39 AM 02/24/2022    1:52 AM 02/23/2022    6:26 PM  CBC  WBC 4.0 - 10.5 K/uL 10.7  14.8    Hemoglobin 13.0 - 17.0 g/dL 12.8  13.9  16.0   Hematocrit 39.0 - 52.0 % 36.3  39.6  47.0   Platelets 150 - 400 K/uL 183  205         Latest Ref Rng & Units 02/25/2022   12:39 AM 02/24/2022    1:52 AM 02/23/2022   11:44 PM  BMP  Glucose 70 - 99 mg/dL 352  223    BUN 8 - 23 mg/dL 24  31    Creatinine 0.61 - 1.24 mg/dL 1.32  1.76    Sodium 135 - 145 mmol/L 131  134    Potassium 3.5 - 5.1 mmol/L 3.7  3.6  3.0   Chloride 98 - 111 mmol/L 96  92    CO2 22 - 32 mmol/L 25  30    Calcium 8.9 - 10.3 mg/dL 8.6  9.0        Discharge Instructions: Discharge Instructions     Call MD for:  difficulty breathing, headache or visual disturbances    Complete by: As directed    Call MD for:  extreme fatigue   Complete by: As directed    Call MD for:  hives   Complete by: As directed    Call MD for:  persistant dizziness or light-headedness   Complete by: As directed    Call MD for:  persistant nausea and vomiting   Complete by: As directed    Call MD for:  redness, tenderness, or signs of infection (pain, swelling, redness, odor or green/yellow discharge around incision site)   Complete by: As directed    Call MD for:  severe uncontrolled  pain   Complete by: As directed    Call MD for:  temperature >100.4   Complete by: As directed    Diet - low sodium heart healthy   Complete by: As directed    Increase activity slowly   Complete by: As directed        Signed: Angelique Blonder, DO 02/25/2022, 3:36 PM   Pager: [phone]  Attestation for Student Documentation:  I personally was present and performed or re-performed the history, physical exam and medical decision-making activities of this service and have verified that the service and findings are accurately documented in the student's note.  Angelique Blonder, DO 02/25/2022, 3:36 PM

## 2022-02-25 NOTE — Progress Notes (Signed)
Seth [name] to be D/C'd per MD order. Discussed with the patient and all questions fully answered. ? VSS, Skin clean, dry and intact without evidence of skin break down, no evidence of skin tears noted. ? IV catheter's discontinued intact. Site without signs and symptoms of complications. Dressing and pressure applied. ? An After Visit Summary was printed and given to the patient. Patient has prescribed medications at bedside per [facility] pharmacy. ? D/c education completed with patient/family including follow up instructions, medication list, d/c activities limitations if indicated, with other d/c instructions as indicated by MD - patient able to verbalize understanding, all questions fully answered.  ? Patient instructed to return to ED, call 911, or call MD for any changes in condition.   Patient satisfied that all belongings were returned including, wallet, clothing, cell phone and medications. ? Patient declined wheelchair and walked off unit to D/C home via private auto.

## 2022-02-26 ENCOUNTER — Telehealth: Payer: Self-pay

## 2022-02-26 ENCOUNTER — Other Ambulatory Visit: Payer: Self-pay | Admitting: Student

## 2022-02-26 DIAGNOSIS — E119 Type 2 diabetes mellitus without complications: Secondary | ICD-10-CM

## 2022-02-26 NOTE — Patient Outreach (Signed)
  Care Coordination [facility] Note Transition Care Management Unsuccessful Follow-up Telephone Call  Date of discharge and from where:  Seth [name] 02/25/22  Attempts:  1st Attempt  Reason for unsuccessful TCM follow-up call:  No answer/busy  Johnney Killian, RN, BSN, CCM Care Management Coordinator [facility] Phone: [phone]: [phone]

## 2022-03-01 ENCOUNTER — Telehealth: Payer: Self-pay

## 2022-03-01 NOTE — Patient Outreach (Signed)
  Care Coordination TOC Note Transition Care Management Follow-up Telephone Call Date of discharge and from where: [facility] How have you been since you were released from the hospital? "I am doing pretty good, have made progress on my cbg numbers.  Mostly in the mid 200's and 175.  I have an appointment tomorrow with the diabetic educator and hopefully she can help me get the down more". Any questions or concerns? No  Items Reviewed: Did the pt receive and understand the discharge instructions provided? Yes  Medications obtained and verified? Yes  Other? No  Any new allergies since your discharge? No  Dietary orders reviewed? Yes Do you have support at home? Yes   Home Care and Equipment/Supplies: Were home health services ordered? not applicable If so, what is the name of the agency? N/a  Has the agency set up a time to come to the patient's home? not applicable Were any new equipment or medical supplies ordered?  No What is the name of the medical supply agency? N/a Were you able to get the supplies/equipment? not applicable Do you have any questions related to the use of the equipment or supplies? No  Functional Questionnaire: (I = Independent and D = Dependent) ADLs: I  Bathing/Dressing- I  Meal Prep- I  Eating- I  Maintaining continence- I  Transferring/Ambulation- I  Managing Meds- I  Follow up appointments reviewed:  PCP Hospital f/u appt confirmed? Yes  Scheduled to see Dr. Allyson Sabal on 03/02/22 @ 1045. [facility] f/u appt confirmed? Yes  Scheduled to see Debera Lat on 03/02/22 @ 0915. Are transportation arrangements needed? No  If their condition worsens, is the pt aware to call PCP or go to the Emergency Dept.? Yes Was the patient provided with contact information for the PCP's office or ED? Yes Was to pt encouraged to call back with questions or concerns? Yes  SDOH assessments and interventions completed:   Yes SDOH Interventions Today     Flowsheet Row Most Recent Value  SDOH Interventions   Food Insecurity Interventions Intervention Not Indicated  Housing Interventions Intervention Not Indicated       Care Coordination Interventions:  Referred for Care Coordination Services:  RN Care Coordinator   Encounter Outcome:  Pt. Visit Completed

## 2022-03-02 ENCOUNTER — Other Ambulatory Visit: Payer: Self-pay

## 2022-03-02 ENCOUNTER — Encounter: Payer: Self-pay | Admitting: *Deleted

## 2022-03-02 ENCOUNTER — Ambulatory Visit (INDEPENDENT_AMBULATORY_CARE_PROVIDER_SITE_OTHER): Payer: Medicare HMO | Admitting: *Deleted

## 2022-03-02 ENCOUNTER — Ambulatory Visit (INDEPENDENT_AMBULATORY_CARE_PROVIDER_SITE_OTHER): Payer: Medicare HMO | Admitting: Student

## 2022-03-02 ENCOUNTER — Encounter: Payer: Self-pay | Admitting: Student

## 2022-03-02 ENCOUNTER — Ambulatory Visit (INDEPENDENT_AMBULATORY_CARE_PROVIDER_SITE_OTHER): Payer: Medicare HMO | Admitting: Dietician

## 2022-03-02 VITALS — BP 137/95 | HR 62 | Temp 98.0°F | Resp 28 | Ht 73.0 in | Wt 172.0 lb

## 2022-03-02 VITALS — BP 137/95 | HR 62 | Temp 98.0°F | Ht 73.0 in | Wt 172.0 lb

## 2022-03-02 DIAGNOSIS — N179 Acute kidney failure, unspecified: Secondary | ICD-10-CM

## 2022-03-02 DIAGNOSIS — Z7984 Long term (current) use of oral hypoglycemic drugs: Secondary | ICD-10-CM

## 2022-03-02 DIAGNOSIS — Z0000 Encounter for general adult medical examination without abnormal findings: Secondary | ICD-10-CM | POA: Diagnosis not present

## 2022-03-02 DIAGNOSIS — Z794 Long term (current) use of insulin: Secondary | ICD-10-CM | POA: Diagnosis not present

## 2022-03-02 DIAGNOSIS — E119 Type 2 diabetes mellitus without complications: Secondary | ICD-10-CM

## 2022-03-02 DIAGNOSIS — I10 Essential (primary) hypertension: Secondary | ICD-10-CM | POA: Diagnosis not present

## 2022-03-02 DIAGNOSIS — F17210 Nicotine dependence, cigarettes, uncomplicated: Secondary | ICD-10-CM

## 2022-03-02 DIAGNOSIS — R634 Abnormal weight loss: Secondary | ICD-10-CM | POA: Diagnosis not present

## 2022-03-02 MED ORDER — LANTUS SOLOSTAR 100 UNIT/ML ~~LOC~~ SOPN: 16.0000 [IU] | PEN_INJECTOR | Freq: Every day | SUBCUTANEOUS | 1 refills | Status: AC

## 2022-03-02 MED ORDER — LISINOPRIL 20 MG PO TABS: 20.0000 mg | ORAL_TABLET | Freq: Every day | ORAL | 3 refills | Status: DC

## 2022-03-02 MED ORDER — METFORMIN HCL ER 500 MG PO TB24: 500.0000 mg | ORAL_TABLET | Freq: Every day | ORAL | 1 refills | Status: AC

## 2022-03-02 NOTE — Progress Notes (Signed)
Diabetes Self-Management Education  Visit Type: First/Initial  Appt. Start Time: 915 Appt. End Time: 1610  03/02/2022  Mr. Franciszek Platten, identified by name and date of birth, is a 67 y.o. male with a diagnosis of Diabetes: Type 2.   ASSESSMENT  Mr. Parke Simmers says he feels he got complacent with his diabetes and it suddenly was out of control with weight loss and dehydration. He was hospitalized and started on insulin. He is happy that he has regained some weight and we discussed this is likely due to hydration and better glucose control. He is self monitoring his blood sugars and interested in how to eat to keep his numbers controlled.   Estimated body mass index is 22.69 kg/m as calculated from the following:   Height as of an earlier encounter on 03/02/22: '6\' 1"'$  (1.854 m).   Weight as of an earlier encounter on 03/02/22: 172 lb (78 kg). Wt Readings from Last 10 Encounters:  03/02/22 172 lb (78 kg)  02/23/22 157 lb (71.2 kg)  02/23/22 157 lb 8 oz (71.4 kg)  12/30/21 177 lb 11.2 oz (80.6 kg)  08/21/21 169 lb (76.7 kg)  06/16/21 163 lb (73.9 kg)  05/20/21 163 lb (73.9 kg)  01/29/21 170 lb 3.2 oz (77.2 kg)  12/31/20 173 lb 4.8 oz (78.6 kg)  01/02/20 176 lb 3.2 oz (79.9 kg)   Lab Results  Component Value Date   HGBA1C 14.3 (H) 02/23/2022   HGBA1C 8.8 (A) 12/30/2021   HGBA1C 8.6 (A) 08/21/2021   HGBA1C 7.0 (A) 11/19/2020   HGBA1C 7.6 (A) 01/02/2020    He states he would like his blood sugars to be 70-180 most of the time.    Diabetes Self-Management Education - 03/02/22 1500       Visit Information   Visit Type First/Initial      Initial Visit   Diabetes Type Type 2    Date Diagnosed 2018    Are you currently following a meal plan? Yes    What type of meal plan do you follow? now he drinks only non sugar containing beverages    Are you taking your medications as prescribed? Yes      Health Coping   How would you rate your overall health? Good      Psychosocial  Assessment   Patient Belief/Attitude about Diabetes Motivated to manage diabetes    What is the hardest part about your diabetes right now, causing you the most concern, or is the most worrisome to you about your diabetes?   Other (comment)   need to assess at future visit- he is trying to quit smoking and to drink non sugard beverages   Self-care barriers Lack of material resources    Self-management support CDE visits;[place] office    Other persons present --   none   Patient Concerns Glycemic Control    Special Needs None    Preferred Learning Style No preference indicated    Learning Readiness Ready    How often do you need to have someone help you when you read instructions, pamphlets, or other written materials from your doctor or pharmacy? 2 - Rarely    What is the last grade level you completed in school? 12      Pre-Education Assessment   Patient understands the diabetes disease and treatment process. Comprehends key points    Patient understands incorporating nutritional management into lifestyle. Needs Review    Patient undertands incorporating physical activity into lifestyle. Needs Review  Patient understands using medications safely. Needs Review    Patient understands monitoring blood glucose, interpreting and using results Needs Review    Patient understands prevention, detection, and treatment of acute complications. Needs Review    Patient understands prevention, detection, and treatment of chronic complications. --   need to assess   Patient understands how to develop strategies to address psychosocial issues. Comprehends key points    Patient understands how to develop strategies to promote health/change behavior. Comprehends key points      Complications   Last HgB A1C per patient/outside source 14.3 %    How often do you check your blood sugar? 3-4 times/day    Fasting Blood glucose range (mg/dL) >200    Postprandial Blood glucose range (mg/dL) 130-179;180-200     Number of hypoglycemic episodes per month 0    Number of hyperglycemic episodes ( >'200mg'$ /dL): Daily    Can you tell when your blood sugar is high? Yes    What do you do if your blood sugar is high? adjusts food, drinks more    Have you had a dilated eye exam in the past 12 months? Yes    Have you had a dental exam in the past 12 months? --   need to assess   Are you checking your feet? --   began this discussion today as he has a visit with podiatry     Dietary Intake   Breakfast coffee with cream, a few days a week only coffee at work, other days he gets some days  (today) has chicken noodle soup that he strains or  Mcdonald sausage and cheese mcmuffin    Lunch burger or skips    Snack (afternoon) fruit- apple or orange or canned peaches    Dinner gets peppersteak and onion with rice or Soba noodles from restaurant, burger w/ tomateo and lettuce and fries or he cooks vegeables and steak    Snack (evening) cookies, ice cream or fruit    Beverage(s) Ic mostly now( SF drink) some water, and diet soda      Activity / Exercise   Activity / Exercise Type ADL's;Light (walking / raking leaves)   need to assess in more detail at fu     Patient Education   Previous Diabetes Education Yes (please comment)   MNT for diabetes in 2018 x 3-4 visits   Healthy Eating Plate Method;Reviewed blood glucose goals for pre and post meals and how to evaluate the patients' food intake on their blood glucose level.;Role of diet in the treatment of diabetes and the relationship between the three main macronutrients and blood glucose level    Monitoring Identified appropriate SMBG and/or A1C goals.;Purpose and frequency of SMBG.    Lifestyle and Health Coping Review risk of smoking and offered smoking cessation      Individualized Goals (developed by patient)   Nutrition General guidelines for healthy choices and portions discussed   see patient instructions     Outcomes   Expected Outcomes Demonstrated interest  in learning. Expect positive outcomes    Future DMSE 4-6 wks    Program Status Not Completed             Individualized Plan for Diabetes Self-Management Training:   Learning Objective:  Patient will have a greater understanding of diabetes self-management. Patient education plan is to attend individual and/or group sessions per assessed needs and concerns.   Plan:   Patient Instructions  Your blood sugars are improving!  Fruit- limit to 2- servings a day Cookies- oatmeal limit servings  Try to keep your blood sugar from going up more than 50 mg/dL after eating.   Check before and 1-2 hours  Eat more vegetables and nuts and seeds- healthy fats - they do not increase blood sugars much.   Try an avocado as discussed.- slice in soup or make salsa   Recommendation is to follow up in 3-4 weeks.   Butch Penny [phone]     Expected Outcomes:  Demonstrated interest in learning. Expect positive outcomes  Education material provided: Diabetes Resources  If problems or questions, patient to contact team via:  Phone  Future DSME appointment: 4-6 wks Debera Lat, RD 03/02/2022 3:39 PM.

## 2022-03-02 NOTE — Patient Instructions (Signed)
Your blood sugars are improving!   Fruit- limit to 2- servings a day Cookies- oatmeal limit servings  Try to keep your blood sugar from going up more than 50 mg/dL after eating.   Check before and 1-2 hours  Eat more vegetables and nuts and seeds- healthy fats - they do not increase blood sugars much.   Try an avocado as discussed.- slice in soup or make salsa   Recommendation is to follow up in 3-4 weeks.   Butch Penny [phone]

## 2022-03-02 NOTE — Progress Notes (Addendum)
Subjective:   Seth [name] is a 67 y.o. male who presents for an Initial Medicare Annual Wellness Visit.  Review of Systems    Defer to pcp        Objective:    Today's Vitals   03/02/22 1032 03/02/22 1040 03/02/22 1118  BP: (!) 125/93 (!) 137/95   Pulse: 64 62   Temp: 98 F (36.7 C)    TempSrc: Oral    SpO2: 100%    Weight: 171 lb 15.3 oz (78 kg)    Height: '6\' 1"'$  (1.854 m)    PainSc:   0-No pain   Body mass index is 22.69 kg/m.     03/02/2022    4:32 PM 03/02/2022   10:30 AM 02/24/2022    2:21 AM 02/24/2022    2:00 AM 02/23/2022    5:13 PM 02/23/2022    3:30 PM 12/30/2021    2:52 PM  Advanced Directives  Does Patient Have a Medical Advance Directive? Yes No  No Yes Yes Yes  Type of Advance Directive Living will;Healthcare Power of [place];Living will   [place];Living will [place];Living will [place]  Does patient want to make changes to medical advance directive? No - Patient declined No - Patient declined  No - Patient declined No - Patient declined No - Patient declined No - Patient declined  Copy of [place] in Chart?  No - copy requested   Yes - validated most recent copy scanned in chart (See row information), Physician notified Yes - validated most recent copy scanned in chart (See row information) No - copy requested  Would patient like information on creating a medical advance directive? No - Patient declined No - Patient declined No - Patient declined        Current Medications (verified) Outpatient Encounter Medications as of 03/02/2022  Medication Sig   Accu-Chek Softclix Lancets lancets Use as directed up to four times daily   acetaminophen (TYLENOL) 500 MG tablet Take 1,000 mg by mouth daily as needed for mild pain, moderate pain, fever or headache.   ASPIRIN LOW DOSE 81 MG tablet TAKE 1 TABLET (81 MG TOTAL) BY MOUTH DAILY. SWALLOW WHOLE.  (Patient taking differently: Take 81 mg by mouth daily.)   atorvastatin (LIPITOR) 80 MG tablet TAKE 1 TABLET BY MOUTH DAILY AT 6 PM. (Patient taking differently: Take 80 mg by mouth every evening.)   Blood Glucose Monitoring Suppl (ACCU-CHEK GUIDE) w/Device KIT Use as directed up to four times daily   ezetimibe (ZETIA) 10 MG tablet Take 1 tablet (10 mg total) by mouth daily. (Patient taking differently: Take 10 mg by mouth every evening.)   glucose blood (ACCU-CHEK GUIDE) test strip Use as instructed   glucose blood test strip Use as directed up to four times daily   Insulin Pen Needle (TECHLITE PEN NEEDLES) 32G X 4 MM MISC Use as directed up to four times daily   [DISCONTINUED] insulin glargine (LANTUS SOLOSTAR) 100 UNIT/ML Solostar Pen Inject 14 Units into the skin daily.   [DISCONTINUED] JANUVIA 50 MG tablet TAKE 1 TABLET BY MOUTH EVERY DAY   No facility-administered encounter medications on file as of 03/02/2022.    Allergies (verified) Patient has no known allergies.   History: Past Medical History:  Diagnosis Date   CVA (cerebral vascular accident) ([place]) 12/2015   Diabetes ([place]) 2017   Hyperlipidemia    Hypertension    Hypotension  02/23/2022   Phimosis 01/02/2020   Past Surgical History:  Procedure Laterality Date   COLONOSCOPY  2019   CYST REMOVAL LEG     INGUINAL HERNIA REPAIR Right 09/13/2018   Procedure: RIGHT INGUINAL HERNIA REPAIR WITH MESH;  Surgeon: Donnie Mesa, MD;  Location: [facility];  Service: General;  Laterality: Right;  GENERAL AND TAP BLOCK   Family History  Problem Relation Age of Onset   Diabetes Mother    Hypertension Mother    Hypertension Father    Stroke Paternal Grandmother    Colon cancer Neg Hx    Colon polyps Neg Hx    Esophageal cancer Neg Hx    Stomach cancer Neg Hx    Rectal cancer Neg Hx    Social History   Socioeconomic History   Marital status: Single    Spouse name: Not on file   Number of children: Not on file   Years of  education: Not on file   Highest education level: Not on file  Occupational History   Not on file  Tobacco Use   Smoking status: Every Day    Packs/day: 1.00    Years: 45.00    Total pack years: 45.00    Types: Cigarettes    Last attempt to quit: 01/07/2016    Years since quitting: 6.1   Smokeless tobacco: Never   Tobacco comments:    3 cigs per day  Vaping Use   Vaping Use: Never used  Substance and Sexual Activity   Alcohol use: Yes    Comment: rarely./occ.   Drug use: Not Currently    Types: Marijuana    Comment: Not in years   Sexual activity: Never  Other Topics Concern   Not on file  Social History Narrative   Not on file   Social Determinants of Health   Financial Resource Strain: Low Risk  (03/02/2022)   Overall Financial Resource Strain (CARDIA)    Difficulty of Paying Living Expenses: Not hard at all  Food Insecurity: No Food Insecurity (03/01/2022)   Hunger Vital Sign    Worried About Running Out of Food in the Last Year: Never true    Ran Out of Food in the Last Year: Never true  Transportation Needs: No Transportation Needs (02/24/2022)   PRAPARE - Hydrologist (Medical): No    Lack of Transportation (Non-Medical): No  Physical Activity: Inactive (03/02/2022)   Exercise Vital Sign    Days of Exercise per Week: 0 days    Minutes of Exercise per Session: 0 min  Stress: No Stress Concern Present (03/02/2022)   [place]    Feeling of Stress : Not at all  Social Connections: Socially Isolated (03/02/2022)   Social Connection and Isolation Panel [NHANES]    Frequency of Communication with Friends and Family: More than three times a week    Frequency of Social Gatherings with Friends and Family: Never    Attends Religious Services: Never    Marine scientist or Organizations: No    Attends Archivist Meetings: Never    Marital Status: Widowed    Tobacco  Counseling Ready to quit: Not Answered Counseling given: Not Answered Tobacco comments: 3 cigs per day   Clinical Intake:  Pre-visit preparation completed: Yes  Pain : 0-10 Pain Score: 0-No pain     BMI - recorded: 22.69 Nutritional Status: BMI of 19-24  Normal Nutritional Risks: None Diabetes:  Yes CBG done?: Yes CBG resulted in Enter/ Edit results?: No Did pt. bring in CBG monitor from home?: No Glucose Meter Downloaded?: No  How often do you need to have someone help you when you read instructions, pamphlets, or other written materials from your doctor or pharmacy?: 2 - Rarely What is the last grade level you completed in school?: 12th  some college  Nutrition Risk Assessment:  Has the patient had any N/V/D within the last 2 months?  No  Does the patient have any non-healing wounds?  No  Has the patient had any unintentional weight loss or weight gain?  No   Diabetes:  Is the patient diabetic?  Yes  If diabetic, was a CBG obtained today?  Yes  Did the patient bring in their glucometer from home?  No  How often do you monitor your CBG's? Atleast 3 times daily.   Financial Strains and Diabetes Management:  Are you having any financial strains with the device, your supplies or your medication? No .  Does the patient want to be seen by Chronic Care Management for management of their diabetes?  No  Would the patient like to be referred to a Nutritionist or for Diabetic Management?  No   Diabetic Exams:  Diabetic Eye Exam: Completed 03/2021 Diabetic Foot Exam: Completed 12/30/2021    Interpreter Needed?: No  Information entered by :: [email or web address]   Activities of Daily Living    03/02/2022    4:33 PM 03/02/2022   10:29 AM  In your present state of health, do you have any difficulty performing the following activities:  Hearing? 0 0  Vision? 0 0  Difficulty concentrating or making decisions? 0 0  Walking or climbing stairs? 0 0  Dressing or bathing? 0 0  Doing  errands, shopping? 0 0    Patient Care Team: Gaylan Gerold, DO as PCP - General  Indicate any recent Medical Services you may have received from other than [facility] providers in the past year (date may be approximate).     Assessment:   This is a routine wellness examination for Seth [name].  Hearing/Vision screen No results found.  Dietary issues and exercise activities discussed:     Goals Addressed   None   Depression Screen    03/02/2022    4:43 PM 02/23/2022    4:45 PM 12/30/2021    3:03 PM 08/21/2021   10:59 AM 01/29/2021    3:50 PM 11/19/2020    3:55 PM 01/02/2020    3:22 PM  PHQ 2/9 Scores  PHQ - 2 Score 0 0 0 0 0 0 0  PHQ- 9 Score      0 0    Fall Risk    03/02/2022    4:33 PM 03/02/2022   10:28 AM 02/23/2022    3:47 PM 12/30/2021    3:03 PM 08/21/2021   10:59 AM  Fall Risk   Falls in the past year? 0 0 0 0 0  Number falls in past yr: 0 0 0 0 0  Injury with Fall? 0 0  0 0  Risk for fall due to : No Fall Risks No Fall Risks Other (Comment)  No Fall Risks  Risk for fall due to: Comment   Hypotension    Follow up  Falls evaluation completed;Falls prevention discussed Falls evaluation completed Falls evaluation completed Falls evaluation completed;Falls prevention discussed    FALL RISK PREVENTION PERTAINING TO THE HOME:  Any stairs in or around the home? Yes  If so, are there any without handrails? Yes  Home free of loose throw rugs in walkways, pet beds, electrical cords, etc? No  Adequate lighting in your home to reduce risk of falls? Yes   ASSISTIVE DEVICES UTILIZED TO PREVENT FALLS:  Life alert? No  Use of a cane, walker or w/c? No  Grab bars in the bathroom? No  Shower chair or bench in shower? No  Elevated toilet seat or a handicapped toilet? No   TIMED UP AND GO:  Was the test performed? No .  Length of time to ambulate 10 feet: 0 sec.   Gait steady and fast without use of assistive device  Cognitive Function:        03/02/2022    4:42 PM  6CIT  Screen  What Year? 0 points  What month? 0 points  What time? 0 points  Count back from 20 0 points  Months in reverse 2 points  Repeat phrase 2 points  Total Score 4 points    Immunizations Immunization History  Administered Date(s) Administered   Influenza Inj Mdck Quad Pf 11/21/2021   Influenza,inj,Quad PF,6+ Mos 12/24/2016, 10/10/2017, 11/06/2018, 10/03/2019   Influenza-Unspecified 10/25/2020   PNEUMOCOCCAL CONJUGATE-20 12/31/2020   Pneumococcal Polysaccharide-23 12/24/2016   Tdap 05/21/2016    TDAP status: Up to date  Flu Vaccine status: Up to date  Pneumococcal vaccine status: Up to date  Covid-19 vaccine status: Completed vaccines  Qualifies for Shingles Vaccine? Yes   Zostavax completed Yes   Shingrix Completed?: No.    Education has been provided regarding the importance of this vaccine. Patient has been advised to call insurance company to determine out of pocket expense if they have not yet received this vaccine. Advised may also receive vaccine at local pharmacy or Health Dept. Verbalized acceptance and understanding.  Screening Tests Health Maintenance  Topic Date Due   COVID-19 Vaccine (1) Never done   Lung Cancer Screening  Never done   Zoster Vaccines- Shingrix (1 of 2) Never done   OPHTHALMOLOGY EXAM  03/13/2022   HEMOGLOBIN A1C  08/24/2022   FOOT EXAM  12/31/2022   Diabetic kidney evaluation - Urine ACR  02/24/2023   Diabetic kidney evaluation - eGFR measurement  02/26/2023   Medicare Annual Wellness (AWV)  03/03/2023   DTaP/Tdap/Td (2 - Td or Tdap) 05/22/2026   COLONOSCOPY (Pts 45-63yr Insurance coverage will need to be confirmed)  06/16/2028   Pneumonia Vaccine 68 Years old  Completed   INFLUENZA VACCINE  Completed   Hepatitis C Screening  Completed   HPV VACCINES  Aged Out    Health Maintenance  Health Maintenance Due  Topic Date Due   COVID-19 Vaccine (1) Never done   Lung Cancer Screening  Never done   Zoster Vaccines- Shingrix (1 of  2) Never done    Colorectal cancer screening: Type of screening: Colonoscopy. Completed 06/16/2021. Repeat every 7 years  Lung Cancer Screening: (Low Dose CT Chest recommended if Age 68-80years, 30 pack-year currently smoking OR have quit w/in 15years.) does qualify.   Lung Cancer Screening Referral: defer to pcp  Additional Screening:  Hepatitis C Screening: does not qualify; Completed 05/21/2016  Vision Screening: Recommended annual ophthalmology exams for early detection of glaucoma and other disorders of the eye. Is the patient up to date with their annual eye exam?  Yes  Who is the provider or what is the name of the office in which the patient attends annual eye exams? [place] not  established with a provider, would they like to be referred to a provider to establish care? No .   Dental Screening: Recommended annual dental exams for proper oral hygiene  Community Resource Referral / Chronic Care Management: CRR required this visit?  No   CCM required this visit?  No      Plan:     I have personally reviewed and noted the following in the patient's chart:   Medical and social history Use of alcohol, tobacco or illicit drugs  Current medications and supplements including opioid prescriptions. Patient is not currently taking opioid prescriptions. Functional ability and status Nutritional status Physical activity Advanced directives List of other physicians Hospitalizations, surgeries, and ER visits in previous 12 months Vitals Screenings to include cognitive, depression, and falls Referrals and appointments  In addition, I have reviewed and discussed with patient certain preventive protocols, quality metrics, and best practice recommendations. A written personalized care plan for preventive services as well as general preventive health recommendations were provided to patient.     Nicoletta Dress, [place]   03/02/2022   Nurse Notes: face to face  Mr.  Seth [name] , Thank you for taking time to come for your Medicare Wellness Visit. I appreciate your ongoing commitment to your health goals. Please review the following plan we discussed and let me know if I can assist you in the future.   These are the goals we discussed:  Goals   None     This is a list of the screening recommended for you and due dates:  Health Maintenance  Topic Date Due   COVID-19 Vaccine (1) Never done   Screening for Lung Cancer  Never done   Zoster (Shingles) Vaccine (1 of 2) Never done   Eye exam for diabetics  03/13/2022   Hemoglobin A1C  08/24/2022   Complete foot exam   12/31/2022   Yearly kidney health urinalysis for diabetes  02/24/2023   Yearly kidney function blood test for diabetes  02/26/2023   Medicare Annual Wellness Visit  03/03/2023   DTaP/Tdap/Td vaccine (2 - Td or Tdap) 05/22/2026   Colon Cancer Screening  06/16/2028   Pneumonia Vaccine  Completed   Flu Shot  Completed   Hepatitis C Screening: USPSTF Recommendation to screen - Ages 18-79 yo.  Completed   HPV Vaccine  Aged Out

## 2022-03-02 NOTE — Progress Notes (Signed)
I reviewed the AWV findings with the provider who conducted the visit. I was present in the office suite and immediately available to provide assistance and direction throughout the time the service was provided.  

## 2022-03-02 NOTE — Progress Notes (Signed)
   CC: hospital f/u  HPI:  [name] is a 67 y.o. male with history listed below presenting to the [facility] for hospital f/u. Please see individualized problem based charting for full HPI.  Past Medical History:  Diagnosis Date   CVA (cerebral vascular accident) ([place]) 12/2015   Diabetes ([place]) 2017   Hyperlipidemia    Hypertension    Hypotension 02/23/2022   Phimosis 01/02/2020    Review of Systems:  Negative aside from that listed in individualized problem based charting.  Physical Exam:  Vitals:   03/02/22 1032 03/02/22 1040  BP: (!) 125/93 (!) 137/95  Pulse: 64 62  Resp: (!) 28   Temp: 98 F (36.7 C)   TempSrc: Oral   SpO2: 100%   Weight: 172 lb (78 kg)   Height: '6\' 1"'$  (1.854 m)    Physical Exam Constitutional:      Appearance: Normal appearance. He is not ill-appearing.  Eyes:     Extraocular Movements: Extraocular movements intact.     Conjunctiva/sclera: Conjunctivae normal.     Pupils: Pupils are equal, round, and reactive to light.  Cardiovascular:     Rate and Rhythm: Normal rate and regular rhythm.     Heart sounds: Normal heart sounds. No murmur heard.    No gallop.  Pulmonary:     Effort: Pulmonary effort is normal.     Breath sounds: Normal breath sounds. No wheezing, rhonchi or rales.  Abdominal:     General: Bowel sounds are normal. There is no distension.     Palpations: Abdomen is soft.     Tenderness: There is no abdominal tenderness.  Musculoskeletal:        General: Normal range of motion.  Skin:    General: Skin is warm and dry.  Neurological:     Mental Status: He is alert and oriented to person, place, and time. Mental status is at baseline.  Psychiatric:        Mood and Affect: Mood normal.        Behavior: Behavior normal.      Assessment & Plan:   See Encounters Tab for problem based charting.  Patient discussed with Dr. Heber [place]

## 2022-03-02 NOTE — Patient Instructions (Addendum)
[name],  It was a pleasure seeing you in the clinic today.   We have increased your lantus dose to 16 units daily. I have restarted your metformin. Please take 1 tablet daily with breakfast. STOP taking [place]. I have restarted your lisinopril. Please take 1 tablet daily. We are checking your kidney function and electrolytes today. I will call you with the results. Please follow up in 1 month.  Please call our clinic at [phone] if you have any questions or concerns. The best time to call is Monday-Friday from 9am-4pm, but there is someone available 24/7 at the same number. If you need medication refills, please notify your pharmacy one week in advance and they will send [place] a request.   Thank you for letting us take part in your care. We look forward to seeing you next time!

## 2022-03-02 NOTE — Patient Instructions (Signed)
Health Maintenance, Male Adopting a healthy lifestyle and getting preventive care are important in promoting health and wellness. Ask your health care provider about: The right schedule for you to have regular tests and exams. Things you can do on your own to prevent diseases and keep yourself healthy. What should I know about diet, weight, and exercise? Eat a healthy diet  Eat a diet that includes plenty of vegetables, fruits, low-fat dairy products, and lean protein. Do not eat a lot of foods that are high in solid fats, added sugars, or sodium. Maintain a healthy weight Body mass index (BMI) is a measurement that can be used to identify possible weight problems. It estimates body fat based on height and weight. Your health care provider can help determine your BMI and help you achieve or maintain a healthy weight. Get regular exercise Get regular exercise. This is one of the most important things you can do for your health. Most adults should: Exercise for at least 150 minutes each week. The exercise should increase your heart rate and make you sweat (moderate-intensity exercise). Do strengthening exercises at least twice a week. This is in addition to the moderate-intensity exercise. Spend less time sitting. Even light physical activity can be beneficial. Watch cholesterol and blood lipids Have your blood tested for lipids and cholesterol at 68 years of age, then have this test every 5 years. You may need to have your cholesterol levels checked more often if: Your lipid or cholesterol levels are high. You are older than 68 years of age. You are at high risk for heart disease. What should I know about cancer screening? Many types of cancers can be detected early and may often be prevented. Depending on your health history and family history, you may need to have cancer screening at various ages. This may include screening for: Colorectal cancer. Prostate cancer. Skin cancer. Lung  cancer. What should I know about heart disease, diabetes, and high blood pressure? Blood pressure and heart disease High blood pressure causes heart disease and increases the risk of stroke. This is more likely to develop in people who have high blood pressure readings or are overweight. Talk with your health care provider about your target blood pressure readings. Have your blood pressure checked: Every 3-5 years if you are 18-39 years of age. Every year if you are 40 years old or older. If you are between the ages of 65 and 75 and are a current or former smoker, ask your health care provider if you should have a one-time screening for abdominal aortic aneurysm (AAA). Diabetes Have regular diabetes screenings. This checks your fasting blood sugar level. Have the screening done: Once every three years after age 45 if you are at a normal weight and have a low risk for diabetes. More often and at a younger age if you are overweight or have a high risk for diabetes. What should I know about preventing infection? Hepatitis B If you have a higher risk for hepatitis B, you should be screened for this virus. Talk with your health care provider to find out if you are at risk for hepatitis B infection. Hepatitis C Blood testing is recommended for: Everyone born from 1945 through 1965. Anyone with known risk factors for hepatitis C. Sexually transmitted infections (STIs) You should be screened each year for STIs, including gonorrhea and chlamydia, if: You are sexually active and are younger than 68 years of age. You are older than 68 years of age and your   health care provider tells you that you are at risk for this type of infection. Your sexual activity has changed since you were last screened, and you are at increased risk for chlamydia or gonorrhea. Ask your health care provider if you are at risk. Ask your health care provider about whether you are at high risk for HIV. Your health care provider  may recommend a prescription medicine to help prevent HIV infection. If you choose to take medicine to prevent HIV, you should first get tested for HIV. You should then be tested every 3 months for as long as you are taking the medicine. Follow these instructions at home: Alcohol use Do not drink alcohol if your health care provider tells you not to drink. If you drink alcohol: Limit how much you have to 0-2 drinks a day. Know how much alcohol is in your drink. In the [place] one drink equals one 12 oz bottle of beer (355 mL), one 5 oz glass of wine (148 mL), or one 1 oz glass of hard liquor (44 mL). Lifestyle Do not use any products that contain nicotine or tobacco. These products include cigarettes, chewing tobacco, and vaping devices, such as e-cigarettes. If you need help quitting, ask your health care provider. Do not use street drugs. Do not share needles. Ask your health care provider for help if you need support or information about quitting drugs. General instructions Schedule regular health, dental, and eye exams. Stay current with your vaccines. Tell your health care provider if: You often feel depressed. You have ever been abused or do not feel safe at home. Summary Adopting a healthy lifestyle and getting preventive care are important in promoting health and wellness. Follow your health care provider's instructions about healthy diet, exercising, and getting tested or screened for diseases. Follow your health care provider's instructions on monitoring your cholesterol and blood pressure. This information is not intended to replace advice given to you by your health care provider. Make sure you discuss any questions you have with your health care provider. Document Revised: 06/02/2020 Document Reviewed: 06/02/2020 Elsevier Patient Education  2023 [employer]vier Inc.  

## 2022-03-02 NOTE — Assessment & Plan Note (Signed)
There was concern for weight loss in this patient as noted on previous documentation. It does appear that his weight loss was more from losing water weight. Weight during the visit before last was around 175 lbs, at last visit was 157 lbs, and today it has increased back to 172 lbs. He likely had HHS at last visit, which explains profound dehydration, AKI, hypotension. He received aggressive IV hydration while admitted and now these have all resolved and weight has trended back up.  Still would recommend the lung cancer screening CT.

## 2022-03-02 NOTE — Assessment & Plan Note (Signed)
Patient living with HTN. He was previously on lisinopril '40mg'$  daily, norvasc '10mg'$  daily, and chlorthalidone 12.'5mg'$  daily. He was found to be hypotensive last visit and found to be in likely HHS, leading to hospital admission. On discharge, his antihypertensive medications were discontinued due to normal BP readings while there. BP is elevated today in clinic at 137/95.   Given concomitant T2DM with proteinuria, will restart lisinopril alone at '20mg'$  daily to bring him to goal (given history of CVA). Will check BMP to ensure AKI has resolved.  Plan: -restart lisinopril '20mg'$  daily -f/u BMP -f/u in 1 month

## 2022-03-02 NOTE — Assessment & Plan Note (Signed)
AKI noted during admission, trended down to 1.32 on day of discharge. Will repeat BMP today to assess kidney function.   Plan: -f/u BMP

## 2022-03-02 NOTE — Assessment & Plan Note (Signed)
Patient living with T2DM. He was previously on metformin-xr '500mg'$  daily and januvia '50mg'$  daily. He was found to be in likely HHS at last visit and required hospital admission. He was profoundly volume depleted and improved with IV hydration. He was discharged on lantus 14u daily given A1c of 14.3%. Metformin was discontinued due to concern for weight loss but this was likely due to loss of water weight.   Limited data available from glucometer but reveals improvement with lantus although still remaining above goal. Will increase lantus to 16u daily. Will restart metformin-xr '500mg'$  daily. Stopping januvia given risk for hypoglycemia and not much added benefit with this. If diabetes remains uncontrolled moving forward, would consider restarting a SGLT-2i at a low dose in the future (experienced phimosis in the past with high dose jardiance).  Plan: -increased lantus to 16u daily -restart metformin-xr '500mg'$  daily -stop [place] -f/u in 1 month

## 2022-03-03 LAB — BMP8+ANION GAP
Anion Gap: 13 mmol/L (ref 10.0–18.0)
BUN/Creatinine Ratio: 9 — ABNORMAL LOW (ref 10–24)
BUN: 10 mg/dL (ref 8–27)
CO2: 23 mmol/L (ref 20–29)
Calcium: 9 mg/dL (ref 8.6–10.2)
Chloride: 101 mmol/L (ref 96–106)
Creatinine, Ser: 1.14 mg/dL (ref 0.76–1.27)
Glucose: 138 mg/dL — ABNORMAL HIGH (ref 70–99)
Potassium: 4.6 mmol/L (ref 3.5–5.2)
Sodium: 137 mmol/L (ref 134–144)
eGFR: 70 mL/min/{1.73_m2} (ref >59)

## 2022-03-03 NOTE — Progress Notes (Signed)
Patient called.  Left message for patient to call back. BMP with mild hyperglycemia. Kidney function continues to approach baseline.

## 2022-03-04 ENCOUNTER — Encounter: Payer: Self-pay | Admitting: Podiatry

## 2022-03-04 ENCOUNTER — Ambulatory Visit (INDEPENDENT_AMBULATORY_CARE_PROVIDER_SITE_OTHER): Payer: Medicare HMO | Admitting: Podiatry

## 2022-03-04 DIAGNOSIS — M79674 Pain in right toe(s): Secondary | ICD-10-CM | POA: Diagnosis not present

## 2022-03-04 DIAGNOSIS — M79675 Pain in left toe(s): Secondary | ICD-10-CM | POA: Diagnosis not present

## 2022-03-04 DIAGNOSIS — L84 Corns and callosities: Secondary | ICD-10-CM | POA: Diagnosis not present

## 2022-03-04 DIAGNOSIS — B351 Tinea unguium: Secondary | ICD-10-CM

## 2022-03-05 NOTE — Progress Notes (Signed)
Subjective:   Patient ID: Seth [name], male   DOB: 67 y.o.   MRN: [id number]   HPI Patient presents with significant nail disease 1-5 both feet corn callus formation with possibility for plantarflexed metatarsal right   ROS      Objective:  Physical Exam  Neurovascular status intact thick yellow brittle nailbeds 1-5 both feet that are incurvated and get sore and he cannot cut along with lesion x 2 right that can become painful and appear to be related to bone prominence     Assessment:  Mycotic nail infection 1-5 both feet with pain along with chronic lesion formation painful     Plan:  Reviewed lesions present and at this point I went ahead and I did sharp sterile debridement of lesions I discussed the metatarsal parabola and at 1 point consideration for realignment and I went ahead today debrided nailbeds 1-5 both feet no angiogenic bleeding and will be seen back for routine care

## 2022-03-08 NOTE — Progress Notes (Signed)
Internal Medicine Clinic Attending  Case discussed with the resident at the time of the visit.  We reviewed the resident's history and exam and pertinent patient test results.  I agree with the assessment, diagnosis, and plan of care documented in the resident's note.  

## 2022-03-10 ENCOUNTER — Other Ambulatory Visit: Payer: Self-pay | Admitting: Student

## 2022-03-10 DIAGNOSIS — I10 Essential (primary) hypertension: Secondary | ICD-10-CM

## 2022-03-11 ENCOUNTER — Telehealth: Payer: Self-pay

## 2022-03-11 NOTE — Patient Outreach (Signed)
  Care Coordination   03/11/2022 Name: Seth [name] MRN: [id number] DOB: 1956-01-18   Care Coordination Outreach Attempts:  An unsuccessful telephone outreach was attempted today to offer the patient information about available care coordination services as a benefit of their health plan.   Follow Up Plan:  Additional outreach attempts will be made to offer the patient care coordination information and services.   Encounter Outcome:  No Answer   Care Coordination Interventions:  No, not indicated    Lazaro Arms RN, BSN, [facility]   Phone: [phone]

## 2022-03-16 ENCOUNTER — Encounter: Payer: Self-pay | Admitting: *Deleted

## 2022-03-16 ENCOUNTER — Telehealth: Payer: Self-pay | Admitting: *Deleted

## 2022-03-16 NOTE — Progress Notes (Signed)
  Care Coordination Note  03/16/2022 Name: JERSEY ROMEL MRN: [id number] DOB: [birth year]  Clenton Pare Rossa is a 67 y.o. year old male who is a primary care patient of Gaylan Gerold, DO and is actively engaged with the care management team. I reached out to [employer] by phone today to assist with re-scheduling an initial visit with the RN Case Manager  Follow up plan: Unsuccessful telephone outreach attempt made. A HIPAA compliant phone message was left for the patient providing contact information and requesting a return call.   [place]  Direct Dial: [phone]

## 2022-03-16 NOTE — Progress Notes (Signed)
  Care Coordination Note  03/16/2022 Name: ZYIAN BROKENSHIRE MRN: [id number] DOB: Jun 10, 1955  Clenton Pare Cundari is a 67 y.o. year old male who is a primary care patient of Gaylan Gerold, DO and is actively engaged with the care management team. I reached out to Clenton Pare Conklin by phone today to assist with re-scheduling an initial visit with the RN Case Manager  Follow up plan: Telephone appointment with care management team member scheduled for:03/23/22  [place]  Direct Dial: [phone]

## 2022-03-16 NOTE — Telephone Encounter (Signed)
error 

## 2022-03-23 ENCOUNTER — Ambulatory Visit: Payer: Self-pay

## 2022-03-23 NOTE — Patient Outreach (Signed)
  Care Coordination   Initial Visit Note   03/23/2022 Name: Seth [name] MRN: [id number] DOB: 15-Oct-1955  Seth [name] is a 67 y.o. year old male who sees Seth Gerold, DO for primary care. I spoke with  Seth [name] by phone today.  What matters to the patients health and wellness today?  This afternoon, I spoke with Seth [name] who lives alone and also works. He mentioned that he checks his blood sugar three times a day. This morning, his blood sugar level was 97, and he informed me that he is regularly taking his medication and has not experienced any lows. He has consulted with a nutritionist and has a good understanding of his food intake. He has also been drinking more water than he used to. Seth [name] has a follow-up appointment scheduled with his PCP on 03/30/22.     Goals Addressed             This Visit's Progress    I want to lower My A1c       Patient Goals/Self Care Activities: -Patient/Caregiver will self-administer medications as prescribed as evidenced by self-report/primary caregiver report  -Patient/Caregiver will attend all scheduled provider appointments as evidenced by clinician review of documented attendance to scheduled appointments and patient/caregiver report -Patient/Caregiver will call pharmacy for medication refills as evidenced by patient report and review of pharmacy fill history as appropriate -Patient/Caregiver will call provider office for new concerns or questions as evidenced by review of documented incoming telephone call notes and patient report -Patient/Caregiver verbalizes understanding of plan -Patient/Caregiver will focus on medication adherence by taking medications as prescribed  -check blood sugar at prescribed times -check blood sugar if I feel it is too high or too low -record values and write them down take them to all doctor visits   -Drink water         SDOH assessments and interventions completed:  Yes  SDOH  Interventions Today    Flowsheet Row Most Recent Value  SDOH Interventions   Food Insecurity Interventions Intervention Not Indicated  Transportation Interventions Intervention Not Indicated        Care Coordination Interventions:  Yes, provided   Interventions Today    Flowsheet Row Most Recent Value  Chronic Disease   Chronic disease during today's visit Diabetes  General Interventions   General Interventions Discussed/Reviewed General Interventions Discussed, General Interventions Reviewed, Labs, Doctor Visits  Education Interventions   Education Provided Provided Education        Follow up plan: Follow up call scheduled for 04/01/22 330 pm    Encounter Outcome:  Pt. Visit Completed   Seth Arms RN, BSN, [facility]   Phone: [phone]

## 2022-03-23 NOTE — Patient Instructions (Signed)
Visit Information  Thank you for taking time to visit with me today. Please don't hesitate to contact me if I can be of assistance to you.   Following are the goals we discussed today:   Goals Addressed             This Visit's Progress    I want to lower My A1c       Patient Goals/Self Care Activities: -Patient/Caregiver will self-administer medications as prescribed as evidenced by self-report/primary caregiver report  -Patient/Caregiver will attend all scheduled provider appointments as evidenced by clinician review of documented attendance to scheduled appointments and patient/caregiver report -Patient/Caregiver will call pharmacy for medication refills as evidenced by patient report and review of pharmacy fill history as appropriate -Patient/Caregiver will call provider office for new concerns or questions as evidenced by review of documented incoming telephone call notes and patient report -Patient/Caregiver verbalizes understanding of plan -Patient/Caregiver will focus on medication adherence by taking medications as prescribed  -check blood sugar at prescribed times -check blood sugar if I feel it is too high or too low -record values and write them down take them to all doctor visits   -Drink water         Our next appointment is by telephone on 04/01/22 at 330 pm  Please call the care guide team at [phone] if you need to cancel or reschedule your appointment.   If you are experiencing a Mental Health or [place] or need someone to talk to, please call 1-800-273-TALK (toll free, 24 hour hotline)  The patient verbalized understanding of instructions, educational materials, and care plan provided today.    Lazaro Arms RN, BSN, [facility]   Phone: [phone]

## 2022-03-30 ENCOUNTER — Ambulatory Visit (INDEPENDENT_AMBULATORY_CARE_PROVIDER_SITE_OTHER): Payer: Medicare HMO | Admitting: Student

## 2022-03-30 ENCOUNTER — Encounter: Payer: Self-pay | Admitting: Student

## 2022-03-30 VITALS — BP 147/100 | HR 66 | Temp 98.6°F | Wt 176.2 lb

## 2022-03-30 DIAGNOSIS — Z8673 Personal history of transient ischemic attack (TIA), and cerebral infarction without residual deficits: Secondary | ICD-10-CM

## 2022-03-30 DIAGNOSIS — E785 Hyperlipidemia, unspecified: Secondary | ICD-10-CM | POA: Diagnosis not present

## 2022-03-30 DIAGNOSIS — E119 Type 2 diabetes mellitus without complications: Secondary | ICD-10-CM | POA: Diagnosis not present

## 2022-03-30 DIAGNOSIS — I10 Essential (primary) hypertension: Secondary | ICD-10-CM

## 2022-03-30 MED ORDER — AMLODIPINE BESYLATE 5 MG PO TABS: 5.0000 mg | ORAL_TABLET | Freq: Every day | ORAL | 11 refills | Status: AC

## 2022-03-30 NOTE — Assessment & Plan Note (Addendum)
Initial blood pressure 178/86, repeat 147/100.  Patient report adherence to lisinopril 20 mg and he did take his medication today.  Patient was on lisinopril 40 mg, chlorthalidone 12.5 mg and amlodipine 10 mg in the past.  His regimen was stopped in January due to hypotension.  This is likely due to polyuria and dehydration in the setting of severe hyperglycemia.  We will slowly add back his regimen as tolerated.  -Add amlodipine 5 mg to his regimen -Continue lisinopril 20 mg daily -Repeat BMP today after starting lisinopril last visit

## 2022-03-30 NOTE — Progress Notes (Signed)
CC: Follow-up on blood pressure and CBGs  HPI:  [name] is a 67 y.o. living with hypertension, uncontrolled diabetes, CVA, presents to the clinic for 1 month follow-up after insulin adjustment.  Please see problem based charting for detail  Past Medical History:  Diagnosis Date   CVA (cerebral vascular accident) ([place]) 12/2015   Diabetes ([place]) 2017   Hyperlipidemia    Hypertension    Hypotension 02/23/2022   Phimosis 01/02/2020   Review of Systems:  per HPI  Physical Exam:  Vitals:   03/30/22 1455 03/30/22 1536  BP: (!) 178/86 (!) 147/100  Pulse: 60 66  Temp: 98.6 F (37 C)   TempSrc: Oral   SpO2: 100%   Weight: 176 lb 3.2 oz (79.9 kg)    Physical Exam Constitutional:      General: He is not in acute distress.    Appearance: He is not ill-appearing.  Eyes:     General:        Right eye: No discharge.        Left eye: No discharge.     Conjunctiva/sclera: Conjunctivae normal.  Cardiovascular:     Rate and Rhythm: Normal rate and regular rhythm.  Pulmonary:     Effort: Pulmonary effort is normal. No respiratory distress.     Breath sounds: Normal breath sounds. No wheezing.  Musculoskeletal:        General: Normal range of motion.     Cervical back: Normal range of motion.  Skin:    General: Skin is warm.  Neurological:     Mental Status: He is alert. Mental status is at baseline.  Psychiatric:        Mood and Affect: Mood normal.      Assessment & Plan:   See Encounters Tab for problem based charting.  HTN (hypertension) Initial blood pressure 178/86, repeat 147/100.  Patient report adherence to lisinopril 20 mg and he did take his medication today.  Patient was on lisinopril 40 mg, chlorthalidone 12.5 mg and amlodipine 10 mg in the past.  His regimen was stopped in January due to hypotension.  This is likely due to polyuria and dehydration in the setting of severe hyperglycemia.  We will slowly add back his regimen as tolerated.  -Add  amlodipine 5 mg to his regimen -Continue lisinopril 20 mg daily -Repeat BMP today after starting lisinopril last visit  Diabetes ([place]) Patient reports adherence to Lantus 16 units daily and metformin 500 mg daily.  He reports correct administration of Lantus.  He is checking his CBG 3 times a day (morning fasting, midday and qhs).  Patient did not bring his meter to the clinic but states that his CBGs ranges from 80 to 140s in the last few weeks.  He denies any hypoglycemic events.  He states that his polyuria and polydipsia has improved significantly.  He is working on lifestyle modification and cutting back on carbohydrates and sugary food.  Patient could not tolerate SGLT2 inhibitor in the past due to frequent yeast infection.  There was a concerned of weight loss so metformin was discontinued in January.  This is likely fluid loss from severe dehydration.  His weight returned to normal for the last 2 visits.  -Continue metformin 500 mg daily and Lantus 60 units daily -Recheck A1c in 2 months.  Can add GLP-1 if his weight is stable at next visit -Patient will have an ophthalmology visit in 2 weeks  History of CVA (cerebrovascular accident) Patient report adherence  to Lipitor 80 mg and Zetia 10 mg daily.  He is also working on lifestyle modifications to cut back on fatty and starchy food.  -Recheck lipid panel today.  Goal LDL less than 70.  If LDL is not at goal, will discuss with patient regarding PCSK9 inhibitor   Patient discussed with Dr. Evette Doffing

## 2022-03-30 NOTE — Patient Instructions (Addendum)
[name],   It was nice seeing you in the clinic today. Here is a summary of what we talked about:  1.  I am glad you are doing well with the current regimen of diabetes.  Please continue Lantus and metformin.  We will recheck A1c in 3 months  2.  Blood pressure is high today 147/100.  I added amlodipine 5 mg daily to your regimen.  3.  I will recheck your cholesterol today.    Please return in 3 months for A1C recheck.  Please bring your glucometer to the next visit.  Dr. Alfonse Spruce

## 2022-03-30 NOTE — Assessment & Plan Note (Signed)
Patient report adherence to Lipitor 80 mg and Zetia 10 mg daily.  He is also working on lifestyle modifications to cut back on fatty and starchy food.  -Recheck lipid panel today.  Goal LDL less than 70.  If LDL is not at goal, will discuss with patient regarding PCSK9 inhibitor

## 2022-03-30 NOTE — Assessment & Plan Note (Addendum)
Patient reports adherence to Lantus 16 units daily and metformin 500 mg daily.  He reports correct administration of Lantus.  He is checking his CBG 3 times a day (morning fasting, midday and qhs).  Patient did not bring his meter to the clinic but states that his CBGs ranges from 80 to 140s in the last few weeks.  He denies any hypoglycemic events.  He states that his polyuria and polydipsia has improved significantly.  He is working on lifestyle modification and cutting back on carbohydrates and sugary food.  Patient could not tolerate SGLT2 inhibitor in the past due to frequent yeast infection.  There was a concerned of weight loss so metformin was discontinued in January.  This is likely fluid loss from severe dehydration.  His weight returned to normal for the last 2 visits.  -Continue metformin 500 mg daily and Lantus 60 units daily -Recheck A1c in 2 months.  Can add GLP-1 if his weight is stable at next visit -Patient will have an ophthalmology visit in 2 weeks

## 2022-03-31 NOTE — Progress Notes (Signed)
Internal Medicine Clinic Attending  Case discussed with Dr. Nguyen  At the time of the visit.  We reviewed the resident's history and exam and pertinent patient test results.  I agree with the assessment, diagnosis, and plan of care documented in the resident's note. 

## 2022-04-01 ENCOUNTER — Telehealth: Payer: Self-pay

## 2022-04-01 LAB — BMP8+ANION GAP
Anion Gap: 16 mmol/L (ref 10.0–18.0)
BUN/Creatinine Ratio: 12 (ref 10–24)
BUN: 13 mg/dL (ref 8–27)
CO2: 20 mmol/L (ref 20–29)
Calcium: 9.9 mg/dL (ref 8.6–10.2)
Chloride: 103 mmol/L (ref 96–106)
Creatinine, Ser: 1.07 mg/dL (ref 0.76–1.27)
Glucose: 106 mg/dL — ABNORMAL HIGH (ref 70–99)
Potassium: 3.6 mmol/L (ref 3.5–5.2)
Sodium: 139 mmol/L (ref 134–144)
eGFR: 76 mL/min/{1.73_m2} (ref >59)

## 2022-04-01 LAB — LIPID PANEL
Chol/HDL Ratio: 2.6 ratio (ref 0.0–5.0)
Cholesterol, Total: 135 mg/dL (ref 100–199)
HDL: 51 mg/dL (ref >39)
LDL Chol Calc (NIH): 67 mg/dL (ref 0–99)
Triglycerides: 87 mg/dL (ref 0–149)
VLDL Cholesterol Cal: 17 mg/dL (ref 5–40)

## 2022-04-01 NOTE — Patient Outreach (Addendum)
  Care Coordination   04/01/2022 Name: Seth [name] MRN: [id number] DOB: 05/03/1955   Care Coordination Outreach Attempts:  A second unsuccessful outreach was attempted today to offer the patient with information about available care coordination services as a benefit of their health plan.     Follow Up Plan:  Additional outreach attempts will be made to offer the patient care coordination information and services.   Encounter Outcome:  No Answer   Care Coordination Interventions:  No, not indicated     Addendum:  The patient called and wanted to reschedule for tomorrow at 4 pm.   Lazaro Arms RN, BSN, [facility]   Phone: [phone]

## 2022-04-02 ENCOUNTER — Ambulatory Visit: Payer: Self-pay

## 2022-04-03 NOTE — Patient Outreach (Signed)
  Care Coordination   Initial Visit Note   04/02/2022 Name: Seth [name] MRN: [id number] DOB: January 26, 1955  Seth [name] is a 67 y.o. year old male who sees Seth Gerold, DO for primary care. I spoke with  Seth [name] by phone today.  What matters to the patients health and wellness today?  I had a conversation with [name], and he is aware that his latest A1C reading was over 14.3. He mentioned that his previous visit with the doctor was quite alarming, and he's been putting in a lot of effort to manage his blood sugar levels. He has been monitoring his blood sugar levels twice a day, and his readings range from 85 to 133. I praised him for his progress and advised him to keep it up. Additionally, I discussed his blood pressure and recommended he measure and document his readings.    Goals Addressed             This Visit's Progress    I want to lower My A1c       Patient Goals/Self Care Activities: -Patient/Caregiver will self-administer medications as prescribed as evidenced by self-report/primary caregiver report  -Patient/Caregiver will attend all scheduled provider appointments as evidenced by clinician review of documented attendance to scheduled appointments and patient/caregiver report -Patient/Caregiver will call pharmacy for medication refills as evidenced by patient report and review of pharmacy fill history as appropriate -Patient/Caregiver will call provider office for new concerns or questions as evidenced by review of documented incoming telephone call notes and patient report -Patient/Caregiver verbalizes understanding of plan -Patient/Caregiver will focus on medication adherence by taking medications as prescribed  -check blood sugar at prescribed times -check blood sugar if I feel it is too high or too low -record values and write them down take them to all doctor visits   -Drink water Patient Goals/Self Care Activities: -Calls provider office for new  concerns, questions, or BP outside discussed parameters -Checks BP and records as discussed -Follows a low sodium diet/DASH diet  BP Readings from Last 3 Encounters:  03/30/22 (!) 147/100  03/02/22 (!) 137/95  03/02/22 (!) 137/95   Lab Results  Component Value Date   HGBA1C 14.3 (H) 02/23/2022            SDOH assessments and interventions completed:  Yes  SDOH Interventions Today    Flowsheet Row Most Recent Value  SDOH Interventions   Food Insecurity Interventions Intervention Not Indicated  Transportation Interventions Intervention Not Indicated        Care Coordination Interventions:  Yes, provided   Interventions Today    Flowsheet Row Most Recent Value  Chronic Disease   Chronic disease during today's visit Diabetes, Hypertension (HTN)  General Interventions   General Interventions Discussed/Reviewed General Interventions Discussed, General Interventions Reviewed, Labs, Annual Eye Exam  Labs Hgb A1c every 3 months  Exercise Interventions   Exercise Discussed/Reviewed Exercise Discussed  Education Interventions   Education Provided Provided Education  Provided Verbal Education On Blood Sugar Monitoring  Nutrition Interventions   Nutrition Discussed/Reviewed Nutrition Discussed        Follow up plan: Follow up call scheduled for 04/16/22 4 pm    Encounter Outcome:  Pt. Visit Completed   Lazaro Arms RN, BSN, [facility]   Phone: [phone]

## 2022-04-03 NOTE — Patient Instructions (Signed)
Visit Information  Thank you for taking time to visit with me today. Please don't hesitate to contact me if I can be of assistance to you.   Following are the goals we discussed today:   Goals Addressed             This Visit's Progress    I want to lower My A1c       Patient Goals/Self Care Activities: -Patient/Caregiver will self-administer medications as prescribed as evidenced by self-report/primary caregiver report  -Patient/Caregiver will attend all scheduled provider appointments as evidenced by clinician review of documented attendance to scheduled appointments and patient/caregiver report -Patient/Caregiver will call pharmacy for medication refills as evidenced by patient report and review of pharmacy fill history as appropriate -Patient/Caregiver will call provider office for new concerns or questions as evidenced by review of documented incoming telephone call notes and patient report -Patient/Caregiver verbalizes understanding of plan -Patient/Caregiver will focus on medication adherence by taking medications as prescribed  -check blood sugar at prescribed times -check blood sugar if I feel it is too high or too low -record values and write them down take them to all doctor visits   -Drink water Patient Goals/Self Care Activities: -Calls provider office for new concerns, questions, or BP outside discussed parameters -Checks BP and records as discussed -Follows a low sodium diet/DASH diet  BP Readings from Last 3 Encounters:  03/30/22 (!) 147/100  03/02/22 (!) 137/95  03/02/22 (!) 137/95   Lab Results  Component Value Date   HGBA1C 14.3 (H) 02/23/2022            Our next appointment is by telephone on 04/16/22 at 4 pm  Please call the care guide team at [phone] if you need to cancel or reschedule your appointment.   If you are experiencing a Mental Health or [place] or need someone to talk to, please call 1-800-273-TALK (toll free, 24  hour hotline)  The patient verbalized understanding of instructions, educational materials, and care plan provided today.    Lazaro Arms RN, BSN, [facility]   Phone: [phone]

## 2022-04-16 ENCOUNTER — Ambulatory Visit: Payer: Self-pay

## 2022-04-16 NOTE — Patient Instructions (Signed)
Visit Information  Thank you for taking time to visit with me today. Please don't hesitate to contact me if I can be of assistance to you.   Following are the goals we discussed today:   Goals Addressed             This Visit's Progress    I want to lower My A1c       Patient Goals/Self Care Activities: -Patient/Caregiver will self-administer medications as prescribed as evidenced by self-report/primary caregiver report  -Patient/Caregiver will attend all scheduled provider appointments as evidenced by clinician review of documented attendance to scheduled appointments and patient/caregiver report -Patient/Caregiver will call pharmacy for medication refills as evidenced by patient report and review of pharmacy fill history as appropriate -Patient/Caregiver will call provider office for new concerns or questions as evidenced by review of documented incoming telephone call notes and patient report -Patient/Caregiver verbalizes understanding of plan -Patient/Caregiver will focus on medication adherence by taking medications as prescribed  -check blood sugar at prescribed times -check blood sugar if I feel it is too high or too low -record values and write them down take them to all doctor visits   -Drink water Patient Goals/Self Care Activities: -Calls provider office for new concerns, questions, or BP outside discussed parameters -Checks BP and records as discussed -Patient will continue to monitor and make changes to his food intake     BP Readings from Last 3 Encounters:  03/30/22 (!) 147/100  03/02/22 (!) 137/95  03/02/22 (!) 137/95   Lab Results  Component Value Date   HGBA1C 14.3 (H) 02/23/2022            Our next appointment is by telephone on 05/21/22 at 4 pm  Please call the care guide team at [phone] if you need to cancel or reschedule your appointment.   If you are experiencing a Mental Health or [place] or need someone to talk to,  please call 1-800-273-TALK (toll free, 24 hour hotline)  The patient verbalized understanding of instructions, educational materials, and care plan provided today.  Lazaro Arms RN, BSN, [facility]   Phone: [phone]

## 2022-04-16 NOTE — Patient Outreach (Signed)
  Care Coordination   Follow Up Visit Note   04/16/2022 Name: Seth [name] MRN: [id number] DOB: 06-04-1955  Seth [name] is a 67 y.o. year old male who sees Gaylan Gerold, [place] for primary care. I spoke with  Seth [name] by phone today.  What matters to the patients health and wellness today?  Seth [name] is doing well. He has made some changes to his eating habits, and they are working well for him. His [name] levels are ranging between 93 and 123. I encouraged him to keep up with the changes he is making because soon they will become a natural part of his routine. He has also been checking his [name] pressure regularly, with readings of 150/107 and 128/87. We talked about the importance of monitoring his values and how they can impact his health. He told me that he plans to continue making positive changes.    Goals Addressed             This Visit's Progress    I want to lower My A1c       Patient Goals/Self Care Activities: -Patient/Caregiver will self-administer medications as prescribed as evidenced by self-report/primary caregiver report  -Patient/Caregiver will attend all scheduled provider appointments as evidenced by clinician review of documented attendance to scheduled appointments and patient/caregiver report -Patient/Caregiver will call pharmacy for medication refills as evidenced by patient report and review of pharmacy fill history as appropriate -Patient/Caregiver will call provider office for new concerns or questions as evidenced by review of documented incoming telephone call notes and patient report -Patient/Caregiver verbalizes understanding of plan -Patient/Caregiver will focus on medication adherence by taking medications as prescribed  -check [name] at prescribed times -check [name] if I feel it is too high or too low -record values and write them down take them to all doctor visits   -Drink water Patient Goals/Self Care  Activities: -Calls provider office for new concerns, questions, or BP outside discussed parameters -Checks BP and records as discussed -Patient will continue to monitor and make changes to his food intake     BP Readings from Last 3 Encounters:  03/30/22 (!) 147/100  03/02/22 (!) 137/95  03/02/22 (!) 137/95   Lab Results  Component Value Date   HGBA1C 14.3 (H) 02/23/2022            SDOH assessments and interventions completed:  No     Care Coordination Interventions:  Yes, provided   Interventions Today    Flowsheet Row Most Recent Value  Chronic Disease   Chronic disease during today's visit Diabetes, Hypertension (HTN)  General Interventions   General Interventions Discussed/Reviewed General Interventions Discussed, General Interventions Reviewed  Education Interventions   Education Provided Provided Education  Provided Verbal Education On Nutrition  Nutrition Interventions   Nutrition Discussed/Reviewed Nutrition Discussed, Fluid intake, Adding fruits and vegetables        Follow up plan: Follow up call scheduled for 05/21/22  4 pm     Lazaro Arms RN, BSN, [facility]   Phone: [phone]

## 2022-05-13 ENCOUNTER — Encounter: Payer: Medicare HMO | Admitting: Dietician

## 2022-05-24 ENCOUNTER — Ambulatory Visit (INDEPENDENT_AMBULATORY_CARE_PROVIDER_SITE_OTHER): Payer: Medicare HMO | Admitting: Dietician

## 2022-05-24 VITALS — Wt 172.6 lb

## 2022-05-24 DIAGNOSIS — E119 Type 2 diabetes mellitus without complications: Secondary | ICD-10-CM

## 2022-05-24 NOTE — Patient Instructions (Addendum)
Make an appointment with your eye doctor as soon as possible:  Insurance claims handler [facility]  Located in: [facility] Address: [address] [place], [place] [address] Phone: [phone]   Call [facility] on your [facility]. Please restart it as soon as possible.  Also you are due to see the doctor to have your A1C checked at that visit.  Please schedule with me on the same day- the front office can help you so this.  Lupita Leash [phone]

## 2022-05-24 NOTE — Progress Notes (Incomplete Revision)
Diabetes Self-Management Education  Visit Type: Follow-up  Appt. Start Time: 11:30  Appt.               End Time: 12:15  05/24/2022  [name], identified by name and date of birth, is a 67 y.o. male with a diagnosis of Diabetes:  .   ASSESSMENT  [name] says he feels he got complacent with his diabetes. He is self monitoring his blood sugars.He brought his meter, and his numbers showing well blood glucose control.Marland KitchenHe is continue having good food choices.At work,he does strenuous exercise everyday.   Estimated body mass index is 22.77 kg/m as calculated from the following:   Height as of 03/02/22: 6\' 1"  (1.854 m).   Weight as of this encounter: 172 lb 9.6 oz (78.3 kg). Wt Readings from Last 10 Encounters:  05/24/22 172 lb 9.6 oz (78.3 kg)  03/30/22 176 lb 3.2 oz (79.9 kg)  03/02/22 171 lb 15.3 oz (78 kg)  03/02/22 172 lb (78 kg)  02/23/22 157 lb (71.2 kg)  02/23/22 157 lb 8 oz (71.4 kg)  12/30/21 177 lb 11.2 oz (80.6 kg)  08/21/21 169 lb (76.7 kg)  06/16/21 163 lb (73.9 kg)  05/20/21 163 lb (73.9 kg)   His weight went down 4 # in a month.  Lab Results  Component Value Date   HGBA1C 14.3 (H) 02/23/2022   HGBA1C 8.8 (A) 12/30/2021   HGBA1C 8.6 (A) 08/21/2021   HGBA1C 7.0 (A) 11/19/2020   HGBA1C 7.6 (A) 01/02/2020    He states he would like his blood sugars to be 70-180 most of the time.  His HGBA1C is due soon, Because his Accu-chek report It is expected his HGBA1C to be lower.    Diabetes Self-Management Education - 05/24/22 1100       Visit Information   Visit Type Follow-up      Health Coping   How would you rate your overall health? Good      Psychosocial Assessment   Patient Belief/Attitude about Diabetes Motivated to manage diabetes    What is the hardest part about your diabetes right now, causing you the most concern, or is the most worrisome to you about your diabetes?   Other (comment)   He wants to know if he need to use insulin for the rest  of his life   Self-management support Doctor's office    Other persons present Patient    Patient Concerns Healthy Lifestyle    Special Needs None    Preferred Learning Style No preference indicated    Learning Readiness Ready    How often do you need to have someone help you when you read instructions, pamphlets, or other written materials from your doctor or pharmacy? 2 - Rarely    What is the last grade level you completed in school? 12      Pre-Education Assessment   Patient understands the diabetes disease and treatment process. Comprehends key points    Patient understands incorporating nutritional management into lifestyle. Needs Review    Patient undertands incorporating physical activity into lifestyle. Needs Instruction    Patient understands using medications safely. Needs Review    Patient understands monitoring blood glucose, interpreting and using results Comprehends key points    Patient understands prevention, detection, and treatment of acute complications. Needs Review    Patient understands prevention, detection, and treatment of chronic complications. Compreheands key points    Patient understands how to develop strategies to address psychosocial issues.  Comprehends key points    Patient understands how to develop strategies to promote health/change behavior. Comprehends key points      Complications   Last HgB A1C per patient/outside source 14.3 %   03/02/2022   How often do you check your blood sugar? 3-4 times/day    Fasting Blood glucose range (mg/dL) 40-981    Postprandial Blood glucose range (mg/dL) 191-478    Number of hypoglycemic episodes per month 0    Number of hyperglycemic episodes ( >200mg /dL): Never    Can you tell when your blood sugar is high? No    Have you had a dilated eye exam in the past 12 months? No    Have you had a dental exam in the past 12 months? No    Are you checking your feet? Yes      Activity / Exercise   Activity / Exercise Type  Other (comment)   At work, he does strenuos physical activity, every day of the week     Patient Education   Being Active Role of exercise on diabetes management, blood pressure control and cardiac health.    Chronic complications Dental care;Retinopathy and reason for yearly dilated eye exams;Assessed and discussed foot care and prevention of foot problems    Lifestyle and Health Coping Review risk of smoking and offered smoking cessation      Individualized Goals (developed by patient)   Medications Other (comment)   He wants to be able to reduced or avoid taking diabetes medications, with just life style modification.     Patient Self-Evaluation of Goals - Patient rates self as meeting previously set goals (% of time)   Nutrition 50 - 75 % (half of the time)    Physical Activity >75% (most of the time)    Monitoring >75% (most of the time)    Problem Solving and behavior change strategies  50 - 75 % (half of the time)    Reducing Risk (treating acute and chronic complications) 50 - 75 % (half of the time)    Health Coping 25 - 50% (sometimes)      Post-Education Assessment   Patient understands incorporating nutritional management into lifestyle. Comprehends key points    Patient undertands incorporating physical activity into lifestyle. Demonstrates understanding / competency    Patient understands using medications safely. Needs Instruction    Patient understands prevention, detection, and treatment of acute complications. Needs Review      Outcomes   Expected Outcomes Demonstrated interest in learning. Expect positive outcomes    Future DMSE 2 wks    Program Status Not Completed      Subsequent Visit   Since your last visit have you continued or begun to take your medications as prescribed? No    Since your last visit have you had your blood pressure checked? Yes    Since your last visit have you experienced any weight changes? No change    Since your last visit, are you  checking your blood glucose at least once a day? Yes             Individualized Plan for Diabetes Self-Management Training:   Learning Objective:  Patient will have a greater understanding of diabetes self-management. Patient education plan is to attend individual and/or group sessions per assessed needs and concerns.   Plan:   Patient Instructions  Make an appointment with your eye doctor as soon as possible:  Nature conservation officer  Located in: [facility]  Address: [address] [place], [place] [address] Phone: [phone]   [facility]. Please restart it as soon as possible.  Also you are due to see the doctor to have your A1C checked at that visit.  Please schedule with me on the same day- the front office can help you so this.  Lupita Leash [phone]     Expected Outcomes:  Demonstrated interest in learning. Expect positive outcomes  Education material provided: none If problems or questions, patient to contact team via:  Phone  Future DSME appointment: 2 wks Graciela Namihira-Alfaro 05/24/2022 5:10 PM.  Julius Bowels, Dietetic Intern  05/24/2022  5:10 PM

## 2022-05-24 NOTE — Progress Notes (Signed)
Diabetes Self-Management Education  Visit Type: Follow-up  Appt. Start Time: 11:30  Appt.               End Time: 12:15  05/24/2022  [name], identified by name and date of birth, is a 67 y.o. male with a diagnosis of Diabetes:  .   ASSESSMENT  [name] says he feels he got complacent with his diabetes. He is self monitoring his blood sugars.He brought his meter, and his numbers showing well blood glucose control.Marland KitchenHe is continue having good food choices.At work,he does strenuous exercise everyday.   Estimated body mass index is 22.77 kg/m as calculated from the following:   Height as of 03/02/22: 6\' 1"  (1.854 m).   Weight as of this encounter: 172 lb 9.6 oz (78.3 kg). Wt Readings from Last 10 Encounters:  05/24/22 172 lb 9.6 oz (78.3 kg)  03/30/22 176 lb 3.2 oz (79.9 kg)  03/02/22 171 lb 15.3 oz (78 kg)  03/02/22 172 lb (78 kg)  02/23/22 157 lb (71.2 kg)  02/23/22 157 lb 8 oz (71.4 kg)  12/30/21 177 lb 11.2 oz (80.6 kg)  08/21/21 169 lb (76.7 kg)  06/16/21 163 lb (73.9 kg)  05/20/21 163 lb (73.9 kg)   His weight went down 4 # in a month.  Lab Results  Component Value Date   HGBA1C 14.3 (H) 02/23/2022   HGBA1C 8.8 (A) 12/30/2021   HGBA1C 8.6 (A) 08/21/2021   HGBA1C 7.0 (A) 11/19/2020   HGBA1C 7.6 (A) 01/02/2020    He states he would like his blood sugars to be 70-180 most of the time.  His HGBA1C is due soon, Because his Accu-chek report It is expected his HGBA1C to be lower.    Diabetes Self-Management Education - 05/24/22 1100       Visit Information   Visit Type Follow-up      Health Coping   How would you rate your overall health? Good      Psychosocial Assessment   Patient Belief/Attitude about Diabetes Motivated to manage diabetes    What is the hardest part about your diabetes right now, causing you the most concern, or is the most worrisome to you about your diabetes?   Other (comment)   He wants to know if he need to use insulin for the rest  of his life   Self-management support Doctor's office    Other persons present Patient    Patient Concerns Healthy Lifestyle    Special Needs None    Preferred Learning Style No preference indicated    Learning Readiness Ready    How often do you need to have someone help you when you read instructions, pamphlets, or other written materials from your doctor or pharmacy? 2 - Rarely    What is the last grade level you completed in school? 12      Pre-Education Assessment   Patient understands the diabetes disease and treatment process. Comprehends key points    Patient understands incorporating nutritional management into lifestyle. Needs Review    Patient undertands incorporating physical activity into lifestyle. Comprehends key points    Patient understands using medications safely. Needs Review    Patient understands monitoring blood glucose, interpreting and using results Comprehends key points    Patient understands prevention, detection, and treatment of acute complications. Needs Review    Patient understands prevention, detection, and treatment of chronic complications. Compreheands key points    Patient understands how to develop strategies to address psychosocial  issues. Comprehends key points    Patient understands how to develop strategies to promote health/change behavior. Comprehends key points      Complications   Last HgB A1C per patient/outside source 14.3 %   03/02/2022   How often do you check your blood sugar? 3-4 times/day    Fasting Blood glucose range (mg/dL) 38-756    Postprandial Blood glucose range (mg/dL) 433-295    Number of hypoglycemic episodes per month 0    Number of hyperglycemic episodes ( >200mg /dL): Never    Can you tell when your blood sugar is high? No    Have you had a dilated eye exam in the past 12 months? No    Have you had a dental exam in the past 12 months? No    Are you checking your feet? Yes      Activity / Exercise   Activity / Exercise  Type Other (comment)   He works he does extenues physical activity     Patient Education   Being Active Role of exercise on diabetes management, blood pressure control and cardiac health.    Chronic complications Dental care;Retinopathy and reason for yearly dilated eye exams;Assessed and discussed foot care and prevention of foot problems    Lifestyle and Health Coping Review risk of smoking and offered smoking cessation      Individualized Goals (developed by patient)   Medications Other (comment)   He wants to be able to reduced or avoid taking diabetes medications, with just life style modification.     Patient Self-Evaluation of Goals - Patient rates self as meeting previously set goals (% of time)   Nutrition 50 - 75 % (half of the time)    Physical Activity >75% (most of the time)    Monitoring 50 - 75 % (half of the time)    Problem Solving and behavior change strategies  25 - 50% (sometimes)    Reducing Risk (treating acute and chronic complications) 50 - 75 % (half of the time)    Health Coping >75% (most of the time)      Post-Education Assessment   Patient understands incorporating nutritional management into lifestyle. Comprehends key points    Patient undertands incorporating physical activity into lifestyle. Demonstrates understanding / competency    Patient understands using medications safely. Needs Instruction    Patient understands prevention, detection, and treatment of acute complications. Needs Review      Outcomes   Expected Outcomes Demonstrated interest in learning. Expect positive outcomes    Future DMSE 2 wks    Program Status Not Completed      Subsequent Visit   Since your last visit have you continued or begun to take your medications as prescribed? No    Since your last visit have you had your blood pressure checked? Yes    Since your last visit have you experienced any weight changes? No change    Since your last visit, are you checking your blood  glucose at least once a day? Yes             Individualized Plan for Diabetes Self-Management Training:   Learning Objective:  Patient will have a greater understanding of diabetes self-management. Patient education plan is to attend individual and/or group sessions per assessed needs and concerns.   Plan:   Patient Instructions  Make an appointment with your eye doctor as soon as possible:  Nature conservation officer  Located in: [facility] Address: [address]  [address] [place], [place] [address] Phone: [phone]   [facility]. Please restart it as soon as possible.  Also you are due to see the doctor to have your A1C checked at that visit.  Please schedule with me on the same day- the front office can help you so this.  Lupita Leash [phone]     Expected Outcomes:  Demonstrated interest in learning. Expect positive outcomes  Education material provided: none If problems or questions, patient to contact team via:  Phone  Future DSME appointment: 2 wks Graciela Namihira-Alfaro 05/24/2022 2:17 PM.  Julius Bowels, Dietetic Intern  05/24/2022  2:45 PM

## 2022-05-24 NOTE — Progress Notes (Signed)
I have reviewed and concur with this student's documentation.   Norm Parcel, RD 05/24/2022 4:57 PM

## 2022-05-27 ENCOUNTER — Other Ambulatory Visit: Payer: Self-pay | Admitting: Student

## 2022-05-27 DIAGNOSIS — E119 Type 2 diabetes mellitus without complications: Secondary | ICD-10-CM

## 2022-05-27 DIAGNOSIS — I10 Essential (primary) hypertension: Secondary | ICD-10-CM

## 2022-05-28 ENCOUNTER — Other Ambulatory Visit: Payer: Self-pay | Admitting: Student

## 2022-06-21 ENCOUNTER — Encounter: Payer: Self-pay | Admitting: *Deleted

## 2022-07-23 ENCOUNTER — Other Ambulatory Visit: Payer: Self-pay | Admitting: Student

## 2022-07-23 DIAGNOSIS — I10 Essential (primary) hypertension: Secondary | ICD-10-CM

## 2022-10-04 ENCOUNTER — Other Ambulatory Visit: Payer: Self-pay | Admitting: Student

## 2022-10-04 DIAGNOSIS — Z8673 Personal history of transient ischemic attack (TIA), and cerebral infarction without residual deficits: Secondary | ICD-10-CM

## 2022-10-04 NOTE — Telephone Encounter (Signed)
atorvastatin (LIPITOR) 80 MG tablet   [id number] - [place] - [address]

## 2022-10-05 MED ORDER — ATORVASTATIN CALCIUM 80 MG PO TABS: 80.0000 mg | ORAL_TABLET | Freq: Every day | ORAL | 3 refills | Status: AC

## 2022-10-28 ENCOUNTER — Other Ambulatory Visit (HOSPITAL_COMMUNITY): Payer: Self-pay

## 2023-02-02 ENCOUNTER — Ambulatory Visit: Payer: Medicare HMO | Admitting: Podiatry

## 2023-02-07 ENCOUNTER — Other Ambulatory Visit: Payer: Self-pay

## 2023-02-07 ENCOUNTER — Ambulatory Visit: Payer: Medicare HMO | Admitting: Podiatry

## 2023-02-07 DIAGNOSIS — B353 Tinea pedis: Secondary | ICD-10-CM

## 2023-02-07 MED ORDER — CLOTRIMAZOLE-BETAMETHASONE 1-0.05 % EX CREA: 1.0000 | TOPICAL_CREAM | Freq: Every day | CUTANEOUS | 1 refills | Status: AC

## 2023-02-07 NOTE — Progress Notes (Signed)
 This patient presents to the office for evaluation of tinea pedis diagnosed by his PCP.  He has now been taking lamisil and feels the foot has improved.  He says there is less itching between the toes 1,2 and 3 right foot.  He presents to the office for evaluation and treatment.  General Appearance  Alert, conversant and in no acute stress.  Vascular  Dorsalis pedis and posterior tibial  pulses are palpable  bilaterally.  Capillary return is within normal limits  bilaterally. Temperature is within normal limits  bilaterally.  Neurologic  Senn-Weinstein monofilament wire test within normal limits  bilaterally. Muscle power within normal limits bilaterally.  Nails Thick disfigured discolored nails with subungual debris  from hallux to fifth toes bilaterally. No evidence of bacterial infection or drainage bilaterally.  Orthopedic  No limitations of motion  feet .  No crepitus or effusions noted.  No bony pathology or digital deformities noted.  Skin  normotropic skin with no porokeratosis noted bilaterally.  No signs of infections or ulcers noted.   Black  callused dry tissue at the plantar base of right foot.   Tinea pedis  IE.  Discussed and evaluated his tinea pedis condition with this patient.  It appears to have dried blister formation on his toes right foot.  Prescribe lotrisone  for topical use.  Cordella Bold DPM

## 2023-04-05 ENCOUNTER — Encounter: Payer: Medicare HMO | Admitting: Student

## 2023-07-11 ENCOUNTER — Encounter: Payer: Self-pay | Admitting: *Deleted
